# Patient Record
Sex: Female | Born: 1940 | Race: White | Hispanic: No | Smoking: Never smoker
Health system: Southern US, Community
[De-identification: ages and names within clinical notes are randomized; demographics above are authoritative.]

## PROBLEM LIST (undated history)

## (undated) ENCOUNTER — Emergency Department (HOSPITAL_COMMUNITY): Admission: EM | Payer: Medicare Other | Source: Home / Self Care

## (undated) DIAGNOSIS — F329 Major depressive disorder, single episode, unspecified: Secondary | ICD-10-CM

## (undated) DIAGNOSIS — I4891 Unspecified atrial fibrillation: Secondary | ICD-10-CM

## (undated) DIAGNOSIS — I10 Essential (primary) hypertension: Secondary | ICD-10-CM

## (undated) DIAGNOSIS — F39 Unspecified mood [affective] disorder: Secondary | ICD-10-CM

## (undated) DIAGNOSIS — Z915 Personal history of self-harm: Secondary | ICD-10-CM

## (undated) DIAGNOSIS — Z9151 Personal history of suicidal behavior: Secondary | ICD-10-CM

## (undated) DIAGNOSIS — I82409 Acute embolism and thrombosis of unspecified deep veins of unspecified lower extremity: Secondary | ICD-10-CM

## (undated) DIAGNOSIS — R1310 Dysphagia, unspecified: Secondary | ICD-10-CM

## (undated) DIAGNOSIS — K219 Gastro-esophageal reflux disease without esophagitis: Secondary | ICD-10-CM

## (undated) DIAGNOSIS — F32A Depression, unspecified: Secondary | ICD-10-CM

## (undated) DIAGNOSIS — J449 Chronic obstructive pulmonary disease, unspecified: Secondary | ICD-10-CM

## (undated) DIAGNOSIS — K449 Diaphragmatic hernia without obstruction or gangrene: Secondary | ICD-10-CM

## (undated) DIAGNOSIS — M1990 Unspecified osteoarthritis, unspecified site: Secondary | ICD-10-CM

## (undated) DIAGNOSIS — N289 Disorder of kidney and ureter, unspecified: Secondary | ICD-10-CM

## (undated) HISTORY — PX: HYSTERECTOMY, ABDOMINAL, OPEN: SHX000826

## (undated) HISTORY — PX: OTHER SURGICAL HISTORY: SHX169

## (undated) HISTORY — PX: ABDOMINAL HYSTERECTOMY: SHX81

## (undated) HISTORY — PX: HIP SURGERY: SHX245

---

## 1996-08-13 ENCOUNTER — Other Ambulatory Visit: Payer: Self-pay | Admitting: Emergency Medicine

## 2001-08-25 ENCOUNTER — Emergency Department: Admit: 2001-08-25 | Payer: Self-pay

## 2006-01-23 ENCOUNTER — Ambulatory Visit: Payer: Self-pay | Admitting: *Deleted

## 2006-01-23 ENCOUNTER — Encounter: Payer: Self-pay | Admitting: Emergency Medicine

## 2006-01-23 ENCOUNTER — Inpatient Hospital Stay (HOSPITAL_COMMUNITY): Admission: EM | Admit: 2006-01-23 | Discharge: 2006-01-31 | Payer: Self-pay | Admitting: *Deleted

## 2006-03-16 ENCOUNTER — Ambulatory Visit: Payer: Self-pay | Admitting: Internal Medicine

## 2006-03-26 ENCOUNTER — Emergency Department (HOSPITAL_COMMUNITY): Admission: EM | Admit: 2006-03-26 | Discharge: 2006-03-26 | Payer: Self-pay | Admitting: Emergency Medicine

## 2006-04-10 ENCOUNTER — Ambulatory Visit: Payer: Self-pay | Admitting: Internal Medicine

## 2006-04-13 ENCOUNTER — Ambulatory Visit: Payer: Self-pay | Admitting: Internal Medicine

## 2008-09-01 ENCOUNTER — Ambulatory Visit: Payer: Self-pay | Admitting: Internal Medicine

## 2008-09-01 LAB — CONVERTED CEMR LAB
ALT: 14 units/L (ref 0–35)
AST: 20 units/L (ref 0–37)
Albumin: 4.8 g/dL (ref 3.5–5.2)
Alkaline Phosphatase: 81 units/L (ref 39–117)
BUN: 16 mg/dL (ref 6–23)
Basophils Absolute: 0 10*3/uL (ref 0.0–0.1)
Basophils Relative: 0 % (ref 0–1)
CO2: 25 meq/L (ref 19–32)
Calcium: 10 mg/dL (ref 8.4–10.5)
Chloride: 103 meq/L (ref 96–112)
Creatinine, Ser: 0.71 mg/dL (ref 0.40–1.20)
Eosinophils Absolute: 0.1 10*3/uL (ref 0.0–0.7)
Eosinophils Relative: 1 % (ref 0–5)
Glucose, Bld: 92 mg/dL (ref 70–99)
HCT: 47.6 % — ABNORMAL HIGH (ref 36.0–46.0)
Hemoglobin: 15.3 g/dL — ABNORMAL HIGH (ref 12.0–15.0)
Lymphocytes Relative: 40 % (ref 12–46)
Lymphs Abs: 3.2 10*3/uL (ref 0.7–4.0)
MCHC: 32.1 g/dL (ref 30.0–36.0)
MCV: 88 fL (ref 78.0–100.0)
Monocytes Absolute: 0.5 10*3/uL (ref 0.1–1.0)
Monocytes Relative: 6 % (ref 3–12)
Neutro Abs: 4.1 10*3/uL (ref 1.7–7.7)
Neutrophils Relative %: 52 % (ref 43–77)
Platelets: 269 10*3/uL (ref 150–400)
Potassium: 4.8 meq/L (ref 3.5–5.3)
RBC: 5.41 M/uL — ABNORMAL HIGH (ref 3.87–5.11)
RDW: 13.3 % (ref 11.5–15.5)
Sodium: 141 meq/L (ref 135–145)
TSH: 1.978 microintl units/mL (ref 0.350–4.500)
Total Bilirubin: 1.1 mg/dL (ref 0.3–1.2)
Total Protein: 7.6 g/dL (ref 6.0–8.3)
WBC: 7.9 10*3/uL (ref 4.0–10.5)

## 2008-09-23 ENCOUNTER — Emergency Department (HOSPITAL_COMMUNITY): Admission: EM | Admit: 2008-09-23 | Discharge: 2008-09-24 | Payer: Self-pay | Admitting: Emergency Medicine

## 2008-10-02 ENCOUNTER — Ambulatory Visit: Payer: Self-pay | Admitting: Internal Medicine

## 2008-10-13 ENCOUNTER — Emergency Department (HOSPITAL_COMMUNITY): Admission: EM | Admit: 2008-10-13 | Discharge: 2008-10-14 | Payer: Self-pay | Admitting: Emergency Medicine

## 2009-06-28 ENCOUNTER — Encounter (INDEPENDENT_AMBULATORY_CARE_PROVIDER_SITE_OTHER): Payer: Self-pay | Admitting: Adult Health

## 2009-06-28 ENCOUNTER — Ambulatory Visit: Payer: Self-pay | Admitting: Internal Medicine

## 2009-06-30 ENCOUNTER — Ambulatory Visit: Payer: Self-pay | Admitting: Internal Medicine

## 2009-06-30 LAB — CONVERTED CEMR LAB

## 2009-07-14 ENCOUNTER — Ambulatory Visit: Payer: Self-pay | Admitting: Internal Medicine

## 2009-07-22 ENCOUNTER — Ambulatory Visit: Payer: Self-pay | Admitting: Psychiatry

## 2009-07-22 ENCOUNTER — Inpatient Hospital Stay (HOSPITAL_COMMUNITY): Admission: RE | Admit: 2009-07-22 | Discharge: 2009-07-28 | Payer: Self-pay | Admitting: Psychiatry

## 2009-10-05 ENCOUNTER — Ambulatory Visit: Payer: Self-pay | Admitting: Internal Medicine

## 2009-10-06 ENCOUNTER — Emergency Department (HOSPITAL_COMMUNITY): Admission: EM | Admit: 2009-10-06 | Discharge: 2009-10-07 | Payer: Self-pay | Admitting: Emergency Medicine

## 2009-10-26 ENCOUNTER — Ambulatory Visit: Payer: Self-pay | Admitting: Internal Medicine

## 2009-11-29 ENCOUNTER — Ambulatory Visit: Payer: Self-pay | Admitting: Psychiatry

## 2009-11-29 ENCOUNTER — Inpatient Hospital Stay (HOSPITAL_COMMUNITY): Admission: EM | Admit: 2009-11-29 | Discharge: 2009-12-06 | Payer: Self-pay | Admitting: Psychiatry

## 2009-12-30 ENCOUNTER — Ambulatory Visit: Payer: Self-pay | Admitting: Internal Medicine

## 2010-01-06 ENCOUNTER — Other Ambulatory Visit: Payer: Self-pay | Admitting: Emergency Medicine

## 2010-01-06 ENCOUNTER — Inpatient Hospital Stay (HOSPITAL_COMMUNITY)
Admission: AD | Admit: 2010-01-06 | Discharge: 2010-01-17 | Payer: Self-pay | Source: Home / Self Care | Admitting: Psychiatry

## 2010-01-06 ENCOUNTER — Ambulatory Visit: Payer: Self-pay | Admitting: Psychiatry

## 2010-03-03 ENCOUNTER — Emergency Department (HOSPITAL_COMMUNITY)
Admission: EM | Admit: 2010-03-03 | Discharge: 2010-03-03 | Disposition: A | Discharge status: Other Acute Inpatient Hospital | Payer: Self-pay | Source: Home / Self Care | Admitting: Emergency Medicine

## 2010-03-03 ENCOUNTER — Inpatient Hospital Stay (HOSPITAL_COMMUNITY)
Admission: RE | Admit: 2010-03-03 | Discharge: 2010-03-16 | Payer: Self-pay | Source: Home / Self Care | Attending: Psychiatry | Admitting: Psychiatry

## 2010-03-29 ENCOUNTER — Inpatient Hospital Stay (HOSPITAL_COMMUNITY)
Admission: EM | Admit: 2010-03-29 | Discharge: 2010-04-01 | Payer: Self-pay | Source: Home / Self Care | Attending: Psychiatry | Admitting: Psychiatry

## 2010-04-04 LAB — TSH: TSH: 1.166 u[IU]/mL (ref 0.350–4.500)
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
## 2010-05-30 LAB — URINALYSIS, ROUTINE W REFLEX MICROSCOPIC
Bilirubin Urine: NEGATIVE
Glucose, UA: NEGATIVE mg/dL
Hgb urine dipstick: NEGATIVE
Ketones, ur: NEGATIVE mg/dL
Nitrite: NEGATIVE
Protein, ur: NEGATIVE mg/dL
Specific Gravity, Urine: 1.006 (ref 1.005–1.030)
Urobilinogen, UA: 1 mg/dL (ref 0.0–1.0)
pH: 7.5 (ref 5.0–8.0)

## 2010-05-30 LAB — RAPID URINE DRUG SCREEN, HOSP PERFORMED
Amphetamines: NOT DETECTED
Barbiturates: NOT DETECTED
Benzodiazepines: NOT DETECTED
Cocaine: NOT DETECTED
Opiates: NOT DETECTED
Tetrahydrocannabinol: NOT DETECTED

## 2010-05-30 LAB — BASIC METABOLIC PANEL
BUN: 6 mg/dL (ref 6–23)
BUN: 12 mg/dL (ref 6–23)
CO2: 29 mEq/L (ref 19–32)
CO2: 32 mEq/L (ref 19–32)
Calcium: 10.1 mg/dL (ref 8.4–10.5)
Calcium: 9.8 mg/dL (ref 8.4–10.5)
Chloride: 104 mEq/L (ref 96–112)
Chloride: 104 mEq/L (ref 96–112)
Creatinine, Ser: 0.98 mg/dL (ref 0.4–1.2)
Creatinine, Ser: 0.81 mg/dL (ref 0.4–1.2)
GFR calc Af Amer: >60 mL/min (ref >60)
GFR calc Af Amer: >60 mL/min (ref >60)
GFR calc non Af Amer: 56 mL/min — ABNORMAL LOW (ref >60)
GFR calc non Af Amer: >60 mL/min (ref >60)
Glucose, Bld: 103 mg/dL — ABNORMAL HIGH (ref 70–99)
Glucose, Bld: 96 mg/dL (ref 70–99)
Potassium: 4 mEq/L (ref 3.5–5.1)
Potassium: 4.6 mEq/L (ref 3.5–5.1)
Sodium: 142 mEq/L (ref 135–145)
Sodium: 144 mEq/L (ref 135–145)

## 2010-05-30 LAB — DIFFERENTIAL
Basophils Absolute: 0 10*3/uL (ref 0.0–0.1)
Basophils Relative: 0 % (ref 0–1)
Eosinophils Absolute: 0.1 10*3/uL (ref 0.0–0.7)
Eosinophils Relative: 1 % (ref 0–5)
Lymphocytes Relative: 38 % (ref 12–46)
Lymphs Abs: 2.5 10*3/uL (ref 0.7–4.0)
Monocytes Absolute: 0.5 10*3/uL (ref 0.1–1.0)
Monocytes Relative: 8 % (ref 3–12)
Neutro Abs: 3.4 10*3/uL (ref 1.7–7.7)
Neutrophils Relative %: 53 % (ref 43–77)

## 2010-05-30 LAB — CBC
HCT: 42.6 % (ref 36.0–46.0)
Hemoglobin: 13.6 g/dL (ref 12.0–15.0)
MCH: 28.2 pg (ref 26.0–34.0)
MCHC: 31.9 g/dL (ref 30.0–36.0)
MCV: 88.2 fL (ref 78.0–100.0)
Platelets: 249 10*3/uL (ref 150–400)
RBC: 4.83 MIL/uL (ref 3.87–5.11)
RDW: 13.1 % (ref 11.5–15.5)
WBC: 6.5 10*3/uL (ref 4.0–10.5)

## 2010-05-30 LAB — TRICYCLICS SCREEN, URINE: TCA Scrn: NOT DETECTED

## 2010-05-30 LAB — ETHANOL: Alcohol, Ethyl (B): <5 mg/dL (ref 0–10)

## 2010-06-01 LAB — URINALYSIS, ROUTINE W REFLEX MICROSCOPIC
Bilirubin Urine: NEGATIVE
Glucose, UA: NEGATIVE mg/dL
Hgb urine dipstick: NEGATIVE
Ketones, ur: NEGATIVE mg/dL
Nitrite: NEGATIVE
Protein, ur: NEGATIVE mg/dL
Specific Gravity, Urine: 1.011 (ref 1.005–1.030)
Urobilinogen, UA: 1 mg/dL (ref 0.0–1.0)
pH: 6 (ref 5.0–8.0)

## 2010-06-01 LAB — CBC
HCT: 46.9 % — ABNORMAL HIGH (ref 36.0–46.0)
Hemoglobin: 15.2 g/dL — ABNORMAL HIGH (ref 12.0–15.0)
MCH: 28.8 pg (ref 26.0–34.0)
MCHC: 32.4 g/dL (ref 30.0–36.0)
MCV: 89 fL (ref 78.0–100.0)
Platelets: 212 10*3/uL (ref 150–400)
RBC: 5.27 MIL/uL — ABNORMAL HIGH (ref 3.87–5.11)
RDW: 12.6 % (ref 11.5–15.5)
WBC: 9.4 10*3/uL (ref 4.0–10.5)

## 2010-06-01 LAB — URINE MICROSCOPIC-ADD ON

## 2010-06-01 LAB — BASIC METABOLIC PANEL
BUN: 10 mg/dL (ref 6–23)
CO2: 32 mEq/L (ref 19–32)
Calcium: 9.9 mg/dL (ref 8.4–10.5)
Chloride: 108 mEq/L (ref 96–112)
Creatinine, Ser: 0.7 mg/dL (ref 0.4–1.2)
GFR calc Af Amer: >60 mL/min (ref >60)
GFR calc non Af Amer: >60 mL/min (ref >60)
Glucose, Bld: 76 mg/dL (ref 70–99)
Potassium: 3.8 mEq/L (ref 3.5–5.1)
Sodium: 144 mEq/L (ref 135–145)

## 2010-06-01 LAB — ETHANOL: Alcohol, Ethyl (B): <5 mg/dL (ref 0–10)

## 2010-06-01 LAB — RAPID URINE DRUG SCREEN, HOSP PERFORMED
Amphetamines: NOT DETECTED
Barbiturates: NOT DETECTED
Benzodiazepines: NOT DETECTED
Cocaine: NOT DETECTED
Opiates: NOT DETECTED
Tetrahydrocannabinol: NOT DETECTED

## 2010-06-01 LAB — DIFFERENTIAL
Basophils Absolute: 0 10*3/uL (ref 0.0–0.1)
Basophils Relative: 0 % (ref 0–1)
Eosinophils Absolute: 0.1 10*3/uL (ref 0.0–0.7)
Eosinophils Relative: 1 % (ref 0–5)
Lymphocytes Relative: 42 % (ref 12–46)
Lymphs Abs: 4 10*3/uL (ref 0.7–4.0)
Monocytes Absolute: 0.8 10*3/uL (ref 0.1–1.0)
Monocytes Relative: 8 % (ref 3–12)
Neutro Abs: 4.5 10*3/uL (ref 1.7–7.7)
Neutrophils Relative %: 48 % (ref 43–77)

## 2010-06-01 LAB — ACETAMINOPHEN LEVEL: Acetaminophen (Tylenol), Serum: <10 ug/mL — ABNORMAL LOW (ref 10–30)

## 2010-06-02 LAB — CBC
HCT: 44.5 % (ref 36.0–46.0)
Hemoglobin: 14.4 g/dL (ref 12.0–15.0)
MCH: 28.7 pg (ref 26.0–34.0)
MCHC: 32.4 g/dL (ref 30.0–36.0)
MCV: 88.6 fL (ref 78.0–100.0)
Platelets: 241 10*3/uL (ref 150–400)
RBC: 5.02 MIL/uL (ref 3.87–5.11)
RDW: 12.8 % (ref 11.5–15.5)
WBC: 7.2 10*3/uL (ref 4.0–10.5)

## 2010-06-02 LAB — DIFFERENTIAL
Basophils Absolute: 0 10*3/uL (ref 0.0–0.1)
Basophils Relative: 0 % (ref 0–1)
Eosinophils Absolute: 0.1 10*3/uL (ref 0.0–0.7)
Eosinophils Relative: 1 % (ref 0–5)
Lymphocytes Relative: 34 % (ref 12–46)
Lymphs Abs: 2.5 10*3/uL (ref 0.7–4.0)
Monocytes Absolute: 0.5 10*3/uL (ref 0.1–1.0)
Monocytes Relative: 7 % (ref 3–12)
Neutro Abs: 4.1 10*3/uL (ref 1.7–7.7)
Neutrophils Relative %: 57 % (ref 43–77)

## 2010-06-02 LAB — COMPREHENSIVE METABOLIC PANEL
ALT: 12 U/L (ref 0–35)
AST: 22 U/L (ref 0–37)
Albumin: 3.7 g/dL (ref 3.5–5.2)
Alkaline Phosphatase: 78 U/L (ref 39–117)
BUN: 10 mg/dL (ref 6–23)
CO2: 28 mEq/L (ref 19–32)
Calcium: 9.6 mg/dL (ref 8.4–10.5)
Chloride: 106 mEq/L (ref 96–112)
Creatinine, Ser: 0.8 mg/dL (ref 0.4–1.2)
GFR calc Af Amer: >60 mL/min (ref >60)
GFR calc non Af Amer: >60 mL/min (ref >60)
Glucose, Bld: 104 mg/dL — ABNORMAL HIGH (ref 70–99)
Potassium: 4 mEq/L (ref 3.5–5.1)
Sodium: 139 mEq/L (ref 135–145)
Total Bilirubin: 1.3 mg/dL — ABNORMAL HIGH (ref 0.3–1.2)
Total Protein: 6.5 g/dL (ref 6.0–8.3)

## 2010-06-02 LAB — ETHANOL: Alcohol, Ethyl (B): <5 mg/dL (ref 0–10)

## 2010-06-02 LAB — URINALYSIS, ROUTINE W REFLEX MICROSCOPIC
Bilirubin Urine: NEGATIVE
Glucose, UA: NEGATIVE mg/dL
Hgb urine dipstick: NEGATIVE
Ketones, ur: NEGATIVE mg/dL
pH: 8 (ref 5.0–8.0)

## 2010-06-02 LAB — URINE MICROSCOPIC-ADD ON

## 2010-06-02 LAB — RAPID URINE DRUG SCREEN, HOSP PERFORMED
Barbiturates: NOT DETECTED
Opiates: NOT DETECTED

## 2010-06-02 LAB — ACETAMINOPHEN LEVEL: Acetaminophen (Tylenol), Serum: <10 ug/mL — ABNORMAL LOW (ref 10–30)

## 2010-06-02 LAB — POCT CARDIAC MARKERS: Troponin i, poc: <0.05 ng/mL (ref 0.00–0.09)

## 2010-06-02 LAB — SALICYLATE LEVEL: Salicylate Lvl: <4 mg/dL (ref 2.8–20.0)

## 2010-06-02 LAB — BRAIN NATRIURETIC PEPTIDE: Pro B Natriuretic peptide (BNP): 35 pg/mL (ref 0.0–100.0)

## 2010-06-02 LAB — LIPASE, BLOOD: Lipase: 34 U/L (ref 11–59)

## 2010-06-04 LAB — COMPREHENSIVE METABOLIC PANEL
AST: 22 U/L (ref 0–37)
Alkaline Phosphatase: 77 U/L (ref 39–117)
BUN: 8 mg/dL (ref 6–23)
CO2: 29 mEq/L (ref 19–32)
Chloride: 104 mEq/L (ref 96–112)
Creatinine, Ser: 0.75 mg/dL (ref 0.4–1.2)
GFR calc Af Amer: >60 mL/min (ref >60)
GFR calc non Af Amer: >60 mL/min (ref >60)
Potassium: 3.8 mEq/L (ref 3.5–5.1)
Total Bilirubin: 1.3 mg/dL — ABNORMAL HIGH (ref 0.3–1.2)

## 2010-06-04 LAB — CBC
Hemoglobin: 14.3 g/dL (ref 12.0–15.0)
MCH: 28.9 pg (ref 26.0–34.0)
MCV: 87.1 fL (ref 78.0–100.0)
RBC: 4.94 MIL/uL (ref 3.87–5.11)
WBC: 12.4 10*3/uL — ABNORMAL HIGH (ref 4.0–10.5)

## 2010-06-04 LAB — DIFFERENTIAL
Eosinophils Absolute: 0.1 10*3/uL (ref 0.0–0.7)
Eosinophils Relative: 0 % (ref 0–5)
Lymphs Abs: 2.2 10*3/uL (ref 0.7–4.0)

## 2010-06-04 LAB — URINE CULTURE: Colony Count: >=100000

## 2010-06-04 LAB — URINALYSIS, ROUTINE W REFLEX MICROSCOPIC
Glucose, UA: NEGATIVE mg/dL
Ketones, ur: NEGATIVE mg/dL
Protein, ur: NEGATIVE mg/dL

## 2010-06-04 LAB — RAPID URINE DRUG SCREEN, HOSP PERFORMED: Cocaine: NOT DETECTED

## 2010-06-07 LAB — TSH: TSH: 1.564 u[IU]/mL (ref 0.350–4.500)

## 2010-06-07 LAB — DIFFERENTIAL
Basophils Relative: 0 % (ref 0–1)
Eosinophils Absolute: 0.1 10*3/uL (ref 0.0–0.7)
Eosinophils Relative: 1 % (ref 0–5)
Monocytes Relative: 7 % (ref 3–12)
Neutrophils Relative %: 53 % (ref 43–77)

## 2010-06-07 LAB — URINALYSIS, ROUTINE W REFLEX MICROSCOPIC
Glucose, UA: NEGATIVE mg/dL
Specific Gravity, Urine: 1.012 (ref 1.005–1.030)
pH: 7.5 (ref 5.0–8.0)

## 2010-06-07 LAB — BASIC METABOLIC PANEL
BUN: 13 mg/dL (ref 6–23)
CO2: 30 mEq/L (ref 19–32)
Chloride: 106 mEq/L (ref 96–112)
Creatinine, Ser: 0.76 mg/dL (ref 0.4–1.2)
Potassium: 3.7 mEq/L (ref 3.5–5.1)

## 2010-06-07 LAB — RAPID URINE DRUG SCREEN, HOSP PERFORMED
Barbiturates: NOT DETECTED
Benzodiazepines: NOT DETECTED
Cocaine: NOT DETECTED
Opiates: NOT DETECTED

## 2010-06-07 LAB — POCT CARDIAC MARKERS: Myoglobin, poc: 50.1 ng/mL (ref 12–200)

## 2010-06-07 LAB — CBC
HCT: 39.8 % (ref 36.0–46.0)
MCHC: 34.2 g/dL (ref 30.0–36.0)
MCV: 87.9 fL (ref 78.0–100.0)
Platelets: 226 10*3/uL (ref 150–400)

## 2010-06-07 LAB — GLUCOSE, CAPILLARY: Glucose-Capillary: 111 mg/dL — ABNORMAL HIGH (ref 70–99)

## 2010-06-14 ENCOUNTER — Encounter: Payer: Self-pay | Admitting: Internal Medicine

## 2010-06-14 LAB — CONVERTED CEMR LAB
ALT: 11 units/L (ref 0–35)
BUN: 16 mg/dL (ref 6–23)
Basophils Absolute: 0 10*3/uL (ref 0.0–0.1)
CO2: 25 meq/L (ref 19–32)
Calcium: 10.2 mg/dL (ref 8.4–10.5)
Chloride: 106 meq/L (ref 96–112)
Creatinine, Ser: 0.78 mg/dL (ref 0.40–1.20)
Eosinophils Relative: 1 % (ref 0–5)
Glucose, Bld: 115 mg/dL — ABNORMAL HIGH (ref 70–99)
HCT: 46.8 % — ABNORMAL HIGH (ref 36.0–46.0)
Hemoglobin: 14.8 g/dL (ref 12.0–15.0)
Lymphocytes Relative: 35 % (ref 12–46)
Lymphs Abs: 2.2 10*3/uL (ref 0.7–4.0)
Monocytes Absolute: 0.5 10*3/uL (ref 0.1–1.0)
Monocytes Relative: 8 % (ref 3–12)
Neutro Abs: 3.5 10*3/uL (ref 1.7–7.7)
Pro B Natriuretic peptide (BNP): 30.4 pg/mL (ref 0.0–100.0)
RBC: 5.34 M/uL — ABNORMAL HIGH (ref 3.87–5.11)
RDW: 13.2 % (ref 11.5–15.5)
Total Bilirubin: 1.9 mg/dL — ABNORMAL HIGH (ref 0.3–1.2)
Vitamin B-12: >2000 pg/mL — ABNORMAL HIGH (ref 211–911)

## 2010-06-26 LAB — URINALYSIS, ROUTINE W REFLEX MICROSCOPIC
Hgb urine dipstick: NEGATIVE
Nitrite: NEGATIVE
Protein, ur: NEGATIVE mg/dL
Specific Gravity, Urine: 1.017 (ref 1.005–1.030)
Urobilinogen, UA: 1 mg/dL (ref 0.0–1.0)

## 2010-06-26 LAB — BASIC METABOLIC PANEL
CO2: 29 mEq/L (ref 19–32)
Calcium: 9.6 mg/dL (ref 8.4–10.5)
GFR calc Af Amer: >60 mL/min (ref >60)
GFR calc non Af Amer: >60 mL/min (ref >60)
Potassium: 4.1 mEq/L (ref 3.5–5.1)
Sodium: 140 mEq/L (ref 135–145)

## 2010-06-26 LAB — CBC
HCT: 43.8 % (ref 36.0–46.0)
Hemoglobin: 14.7 g/dL (ref 12.0–15.0)
MCHC: 33.5 g/dL (ref 30.0–36.0)
RBC: 4.95 MIL/uL (ref 3.87–5.11)

## 2010-06-26 LAB — DIFFERENTIAL
Eosinophils Relative: 1 % (ref 0–5)
Lymphocytes Relative: 36 % (ref 12–46)
Monocytes Absolute: 0.4 10*3/uL (ref 0.1–1.0)
Monocytes Relative: 7 % (ref 3–12)
Neutro Abs: 3.6 10*3/uL (ref 1.7–7.7)

## 2010-06-26 LAB — RAPID URINE DRUG SCREEN, HOSP PERFORMED
Amphetamines: NOT DETECTED
Barbiturates: NOT DETECTED
Opiates: NOT DETECTED
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
## 2010-08-29 ENCOUNTER — Emergency Department (HOSPITAL_COMMUNITY)
Admission: EM | Admit: 2010-08-29 | Discharge: 2010-08-29 | Disposition: A | Discharge status: Home / Self Care | Payer: Medicare Other | Attending: Emergency Medicine | Admitting: Emergency Medicine

## 2010-08-29 DIAGNOSIS — N390 Urinary tract infection, site not specified: Secondary | ICD-10-CM | POA: Insufficient documentation

## 2010-08-29 DIAGNOSIS — F341 Dysthymic disorder: Secondary | ICD-10-CM | POA: Insufficient documentation

## 2010-08-29 DIAGNOSIS — R339 Retention of urine, unspecified: Secondary | ICD-10-CM | POA: Insufficient documentation

## 2010-08-29 LAB — URINALYSIS, ROUTINE W REFLEX MICROSCOPIC
Bilirubin Urine: NEGATIVE
Glucose, UA: NEGATIVE mg/dL
Hgb urine dipstick: NEGATIVE
Specific Gravity, Urine: 1.02 (ref 1.005–1.030)
pH: 5 (ref 5.0–8.0)

## 2010-08-29 LAB — POCT I-STAT, CHEM 8
Calcium, Ion: 1.15 mmol/L (ref 1.12–1.32)
Chloride: 105 mEq/L (ref 96–112)
HCT: 43 % (ref 36.0–46.0)
Potassium: 4.2 mEq/L (ref 3.5–5.1)
Sodium: 140 mEq/L (ref 135–145)

## 2010-08-29 LAB — URINE MICROSCOPIC-ADD ON

## 2011-03-28 ENCOUNTER — Emergency Department (HOSPITAL_COMMUNITY)
Admission: EM | Admit: 2011-03-28 | Discharge: 2011-03-28 | Disposition: A | Discharge status: Home / Self Care | Payer: Medicare HMO | Attending: Internal Medicine | Admitting: Internal Medicine

## 2011-03-28 ENCOUNTER — Emergency Department (HOSPITAL_COMMUNITY): Payer: Medicare HMO

## 2011-03-28 DIAGNOSIS — R079 Chest pain, unspecified: Secondary | ICD-10-CM | POA: Insufficient documentation

## 2011-03-28 DIAGNOSIS — R109 Unspecified abdominal pain: Secondary | ICD-10-CM | POA: Insufficient documentation

## 2011-03-28 DIAGNOSIS — K5900 Constipation, unspecified: Secondary | ICD-10-CM | POA: Insufficient documentation

## 2011-03-28 DIAGNOSIS — R339 Retention of urine, unspecified: Secondary | ICD-10-CM | POA: Insufficient documentation

## 2011-03-28 LAB — COMPREHENSIVE METABOLIC PANEL
ALT: 10 U/L (ref 0–35)
AST: 16 U/L (ref 0–37)
CO2: 28 mEq/L (ref 19–32)
Chloride: 104 mEq/L (ref 96–112)
Creatinine, Ser: 0.77 mg/dL (ref 0.50–1.10)
GFR calc non Af Amer: 83 mL/min — ABNORMAL LOW (ref >90)
Total Bilirubin: 0.8 mg/dL (ref 0.3–1.2)

## 2011-03-28 LAB — CBC
HCT: 43.8 % (ref 36.0–46.0)
Hemoglobin: 14.2 g/dL (ref 12.0–15.0)
RBC: 5.22 MIL/uL — ABNORMAL HIGH (ref 3.87–5.11)
RDW: 13.9 % (ref 11.5–15.5)
WBC: 8.1 10*3/uL (ref 4.0–10.5)

## 2011-03-28 LAB — URINALYSIS, ROUTINE W REFLEX MICROSCOPIC
Bilirubin Urine: NEGATIVE
Hgb urine dipstick: NEGATIVE
Protein, ur: NEGATIVE mg/dL
Urobilinogen, UA: 0.2 mg/dL (ref 0.0–1.0)

## 2011-03-28 LAB — DIFFERENTIAL
Basophils Absolute: 0 10*3/uL (ref 0.0–0.1)
Lymphocytes Relative: 36 % (ref 12–46)
Lymphs Abs: 2.9 10*3/uL (ref 0.7–4.0)
Monocytes Absolute: 0.5 10*3/uL (ref 0.1–1.0)
Neutro Abs: 4.6 10*3/uL (ref 1.7–7.7)

## 2011-03-28 MED ORDER — DOCUSATE SODIUM 50 MG/5ML PO LIQD
100.0000 mg | Freq: Once | ORAL | Status: AC
  Administered 2011-03-28: 100 mg via ORAL
  Filled 2011-03-28: qty 10

## 2011-03-28 MED ORDER — MAGNESIUM CITRATE PO SOLN
1.0000 | Freq: Once | ORAL | Status: DC
  Filled 2011-03-28: qty 296

## 2011-03-28 MED ORDER — DOCUSATE SODIUM 100 MG PO CAPS
100.0000 mg | ORAL_CAPSULE | Freq: Once | ORAL | Status: DC
  Filled 2011-03-28 (×3): qty 1

## 2011-03-28 MED ORDER — BISACODYL 10 MG RE SUPP
10.0000 mg | Freq: Once | RECTAL | Status: AC
  Administered 2011-03-28: 10 mg via RECTAL
  Filled 2011-03-28: qty 1

## 2011-03-28 NOTE — ED Notes (Signed)
Per patient she cannot swallow pills and refusing colace unless liquid form. rn contacting pharmacy

## 2011-03-28 NOTE — ED Notes (Signed)
Spoke with Child psychotherapist and taxi voucher located ED front desk

## 2011-03-28 NOTE — ED Notes (Signed)
Blood drawn by RN and given to phlebotomy who sent blood at 1515

## 2011-03-28 NOTE — ED Notes (Signed)
Pt states she has not had bowel movement or had any urine in 10 days.  States abdomin hurts and is swollen.  Denies the ability to pass gas and denies vomiting.

## 2011-03-28 NOTE — ED Notes (Signed)
Pt. Reports not urinating in 10 days and also not having a BM in 10 days.  Pt. Also requests if she has to go to a "nerve hospital" she wants to go to [facility].  She reports having "nerve problems"  Her father died in 05/04/2022,

## 2011-03-28 NOTE — ED Notes (Signed)
Familiar with pt from previous visits.  Pt reports living alone and being independent with ADLs.  Pt has her own apt. Behind [address].  Pt ststes that she can pay her rent, utilities, and buy groceries with her check, with several hundred dollars still left over.  [name] cell phone needs a new battery and she has money to buy a new phone/battery.  In case of an emergency, pt says she has neighbors who would let her use their phone.  Pt typically uses the bus or taxi for transportation.  Pt denies any social work needs.  She requests a cab voucher to get home because the bus that takes her home quits running at 5:30.  CSW will leave voucher for her at Triage.

## 2011-03-28 NOTE — ED Provider Notes (Signed)
History     CSN: [id number]  Arrival date & time 03/28/11  1123   First MD Initiated Contact with Patient 03/28/11 1228      Chief Complaint  Patient presents with  . Abdominal Pain    (Consider location/radiation/quality/duration/timing/severity/associated sxs/prior treatment) HPI Comments: Patient reports no bowel movements or urinary function for 10 days. Patient seen in clinic today and sent to ED for further evaluation.  Patient is a 71 y.o. female presenting with general illness. The history is provided by the patient. No language interpreter was used.  Illness  The current episode started more than 1 week ago. The onset was gradual. The problem occurs continuously. The problem has been unchanged. The problem is moderate. The symptoms are relieved by nothing. The symptoms are aggravated by nothing. Associated symptoms include abdominal pain (Mild, lower). Pertinent negatives include no fever, no diarrhea, no nausea, no vomiting and no cough.    History reviewed. No pertinent past medical history.  History reviewed. No pertinent past surgical history.  History reviewed. No pertinent family history.  History  Substance Use Topics  . Smoking status: Never Smoker   . Smokeless tobacco: Not on file  . Alcohol Use: No    OB History    Grav Para Term Preterm Abortions TAB SAB Ect Mult Living                  Review of Systems  Unable to perform ROS Constitutional: Negative for fever and chills.  Respiratory: Negative for cough and shortness of breath.   Cardiovascular: Positive for chest pain (chronic, seoncdary to hiatal hernia). Negative for leg swelling.  Gastrointestinal: Positive for abdominal pain (Mild, lower). Negative for nausea, vomiting and diarrhea.  All other systems reviewed and are negative.    Allergies  Dalmane; Penicillins; and Sulfa antibiotics  Home Medications  No current outpatient prescriptions on file.  BP 150/77  Pulse 92  Temp(Src)  98.2 F (36.8 C) (Oral)  Resp 16  Ht 5\' 2"  (1.575 m)  Wt 90 lb (40.824 kg)  BMI 16.46 kg/m2  SpO2 98%  Physical Exam  Nursing note and vitals reviewed. Constitutional: She is oriented to person, place, and time. She appears well-developed and well-nourished. No distress.  HENT:  Head: Normocephalic and atraumatic.  Eyes: EOM are normal. Pupils are equal, round, and reactive to light.  Neck: Normal range of motion. Neck supple.  Cardiovascular: Normal rate and regular rhythm.  Exam reveals no friction rub.   No murmur heard. Pulmonary/Chest: Effort normal and breath sounds normal. No respiratory distress. She has no wheezes. She has no rales.  Abdominal: Soft. She exhibits no distension. There is no tenderness. There is no rebound.  Genitourinary: Rectum normal. Rectal exam shows no mass and no tenderness.       No stool impaction  Musculoskeletal: Normal range of motion. She exhibits no edema.  Neurological: She is alert and oriented to person, place, and time.  Skin: She is not diaphoretic.    ED Course  Procedures (including critical care time)   Labs Reviewed  CBC  DIFFERENTIAL  COMPREHENSIVE METABOLIC PANEL  URINALYSIS, ROUTINE W REFLEX MICROSCOPIC   Dg Abd 1 View  03/28/2011  *RADIOLOGY REPORT*  Clinical Data: Constipation  ABDOMEN - 1 VIEW  Comparison: 10/14/2008  Findings: Nonobstructive bowel gas pattern.  Moderate stool throughout the colon.  Degenerative changes with thoracolumbar scoliosis.  Prior fixation of a right femur fracture.  IMPRESSION: No evidence of bowel obstruction.  Moderate stool  throughout the colon.  Original Report Authenticated By: Charline Bills, M.D.     1. Constipation       MDM    Patient is a 71 year old female who presents with abdominal pain, urinary retention, constipation. Patient reports no bowel movements or any urinary function for the past 10 days. Seen in clinic earlier today and sent to the hospital for further evaluation.  Patient reports history of nerve problems and no other concerning medical history. Patient's vitals were stable and exam is benign. She has mild suprapubic tenderness. We will obtain a cathed urine sample. Also check labs to look at her renal function. Rectal exam shows no evidence of impaction. Abdomen films shows moderate stool burden. Will give colace and PO challenge. Urine normal. Remainder of labs normal. No increased creatinine. Highly doubt no urinary function for 10 days with urine in bladder and normal creatinine. Patient discharged home in stable condition and instructed to use Milk of Magnesia for constipation. Instructed to f/u with PCP in 2-3 days. Given numbers for resources to help establish a primary care provider as needed. Social work to see patient to help with resources. Given dulcolax suppository and given bottle of magnesium citrate to go home with to help with constipation.   Elwin Mocha, MD 03/28/11 [phone]

## 2011-03-28 NOTE — ED Notes (Signed)
Foley removed.

## 2011-03-28 NOTE — ED Provider Notes (Signed)
  I performed a history and physical examination of Shannon Hurst and discussed her management with Dr. Gwendolyn Grant.  I agree with the history, physical, assessment, and plan of care, with the following exceptions: None  Constipation or with no recent abdominal surgery. No blood in the stool. Also states she's had decreased urinary output.  Slightly distended abdomen otherwise benign examination  I was present for the following procedures: None Time Spent in Critical Care of the patient: None  Tildon Husky, MD 03/28/11 [phone]

## 2011-05-29 ENCOUNTER — Emergency Department (HOSPITAL_COMMUNITY)
Admission: EM | Admit: 2011-05-29 | Discharge: 2011-05-30 | Disposition: A | Discharge status: Other Acute Inpatient Hospital | Payer: Medicare HMO | Attending: Emergency Medicine | Admitting: Emergency Medicine

## 2011-05-29 ENCOUNTER — Encounter (HOSPITAL_COMMUNITY): Payer: Self-pay | Admitting: *Deleted

## 2011-05-29 DIAGNOSIS — K449 Diaphragmatic hernia without obstruction or gangrene: Secondary | ICD-10-CM | POA: Insufficient documentation

## 2011-05-29 DIAGNOSIS — R609 Edema, unspecified: Secondary | ICD-10-CM | POA: Insufficient documentation

## 2011-05-29 DIAGNOSIS — R45851 Suicidal ideations: Secondary | ICD-10-CM

## 2011-05-29 DIAGNOSIS — R0602 Shortness of breath: Secondary | ICD-10-CM | POA: Insufficient documentation

## 2011-05-29 DIAGNOSIS — R4182 Altered mental status, unspecified: Secondary | ICD-10-CM | POA: Insufficient documentation

## 2011-05-29 DIAGNOSIS — F411 Generalized anxiety disorder: Secondary | ICD-10-CM | POA: Insufficient documentation

## 2011-05-29 HISTORY — DX: Unspecified mood (affective) disorder: F39

## 2011-05-29 HISTORY — DX: Depression, unspecified: F32.A

## 2011-05-29 HISTORY — DX: Major depressive disorder, single episode, unspecified: F32.9

## 2011-05-29 HISTORY — DX: Gastro-esophageal reflux disease without esophagitis: K21.9

## 2011-05-29 LAB — CBC
Hemoglobin: 14 g/dL (ref 12.0–15.0)
MCH: 25.9 pg — ABNORMAL LOW (ref 26.0–34.0)
MCV: 80.4 fL (ref 78.0–100.0)
RBC: 5.4 MIL/uL — ABNORMAL HIGH (ref 3.87–5.11)
WBC: 7.7 10*3/uL (ref 4.0–10.5)

## 2011-05-29 LAB — DIFFERENTIAL
Eosinophils Absolute: 0.1 10*3/uL (ref 0.0–0.7)
Lymphocytes Relative: 37 % (ref 12–46)
Lymphs Abs: 2.9 10*3/uL (ref 0.7–4.0)
Monocytes Relative: 8 % (ref 3–12)
Neutrophils Relative %: 53 % (ref 43–77)

## 2011-05-29 LAB — URINALYSIS, ROUTINE W REFLEX MICROSCOPIC
Bilirubin Urine: NEGATIVE
Glucose, UA: NEGATIVE mg/dL
Hgb urine dipstick: NEGATIVE
Specific Gravity, Urine: 1.01 (ref 1.005–1.030)
Urobilinogen, UA: 0.2 mg/dL (ref 0.0–1.0)
pH: 6 (ref 5.0–8.0)

## 2011-05-29 LAB — RAPID URINE DRUG SCREEN, HOSP PERFORMED: Barbiturates: NOT DETECTED

## 2011-05-29 LAB — BASIC METABOLIC PANEL
BUN: 10 mg/dL (ref 6–23)
CO2: 26 mEq/L (ref 19–32)
GFR calc non Af Amer: 84 mL/min — ABNORMAL LOW (ref >90)
Glucose, Bld: 93 mg/dL (ref 70–99)
Potassium: 4.5 mEq/L (ref 3.5–5.1)

## 2011-05-29 LAB — ETHANOL: Alcohol, Ethyl (B): <11 mg/dL (ref 0–11)

## 2011-05-29 MED ORDER — ONDANSETRON HCL 4 MG PO TABS: 4.0000 mg | ORAL_TABLET | Freq: Three times a day (TID) | ORAL | Status: DC | PRN

## 2011-05-29 MED ORDER — NICOTINE 21 MG/24HR TD PT24: 21.0000 mg | MEDICATED_PATCH | Freq: Every day | TRANSDERMAL | Status: DC | PRN

## 2011-05-29 MED ORDER — ACETAMINOPHEN 325 MG PO TABS: 650.0000 mg | ORAL_TABLET | ORAL | Status: DC | PRN

## 2011-05-29 MED ORDER — IBUPROFEN 200 MG PO TABS: 600.0000 mg | ORAL_TABLET | Freq: Three times a day (TID) | ORAL | Status: DC | PRN

## 2011-05-29 MED ORDER — ZOLPIDEM TARTRATE 5 MG PO TABS: 5.0000 mg | ORAL_TABLET | Freq: Every evening | ORAL | Status: DC | PRN

## 2011-05-29 NOTE — ED Provider Notes (Signed)
History     CSN: [id number]  Arrival date & time 05/29/11  1515   First MD Initiated Contact with Patient 05/29/11 1606      Chief Complaint  Patient presents with  . Suicidal    pt wrote suicidal note and gave to registration personnel. pt then proceeded to call taxi cab and attempt to leave. pt states "I've been sick for 4 days and haven't been able to make water for 10 days so I wrote that letter."     (Consider location/radiation/quality/duration/timing/severity/associated sxs/prior treatment) HPI Comments: Shannon Hurst is a 71 y.o. Female who states that she is suicidal. She will not specify why. She lives alone. She states she was a leave her house today and talk to a therapist there who recommended she come here. She is take any medicine. She has been able to eat and drink. She states she has not urinated in 10 days. She denies constipation. She was last hospitalized for psychiatric reasons about a year ago. She states she sees her therapist regularly.  The history is provided by the patient.    Past Medical History  Diagnosis Date  . GERD (gastroesophageal reflux disease)   . Mood disorder     History reviewed. No pertinent past surgical history.  History reviewed. No pertinent family history.  History  Substance Use Topics  . Smoking status: Never Smoker   . Smokeless tobacco: Not on file  . Alcohol Use: No    OB History    Grav Para Term Preterm Abortions TAB SAB Ect Mult Living                  Review of Systems  All other systems reviewed and are negative.    Allergies  Dalmane; Penicillins; and Sulfa antibiotics  Home Medications   Current Outpatient Rx  Name Route Sig Dispense Refill  . CYANOCOBALAMIN 1000 MCG/ML IJ SOLN Intramuscular Inject 1,000 mcg into the muscle every 30 (thirty) days.      BP 120/74  Pulse 76  Temp(Src) 97.7 F (36.5 C) (Oral)  Resp 18  SpO2 93%  Physical Exam  Nursing note and vitals  reviewed. Constitutional: She is oriented to person, place, and time. She appears well-developed and well-nourished.  HENT:  Head: Normocephalic and atraumatic.  Eyes: Conjunctivae and EOM are normal. Pupils are equal, round, and reactive to light.  Neck: Normal range of motion and phonation normal. Neck supple.  Cardiovascular: Normal rate, regular rhythm and intact distal pulses.   Pulmonary/Chest: Effort normal and breath sounds normal. She exhibits no tenderness.  Abdominal: Soft. She exhibits no distension. There is no tenderness. There is no guarding.  Musculoskeletal: Normal range of motion. She exhibits edema (Bilateral peripheral edema). She exhibits no tenderness.  Neurological: She is alert and oriented to person, place, and time. She has normal strength. She exhibits normal muscle tone.  Skin: Skin is warm and dry.  Psychiatric: Her behavior is normal.       She is anxious    ED Course  Procedures (including critical care time)  Initial evaluation: Psychiatric screening. ACT assessment. She may benefit from a Telepsych eval.     Labs Reviewed  CBC - Abnormal; Notable for the following:    RBC 5.40 (*)    MCH 25.9 (*)    All other components within normal limits  BASIC METABOLIC PANEL - Abnormal; Notable for the following:    GFR calc non Af Amer 84 (*)    All  other components within normal limits  DIFFERENTIAL  URINALYSIS, ROUTINE W REFLEX MICROSCOPIC  URINE RAPID DRUG SCREEN (HOSP PERFORMED)  ETHANOL  URINE CULTURE   Patient is medically cleared for psych assessment and treatment  1. Suicidal ideation       MDM  Suicide ideation without a plan. Patient lives alone. Psychiatric assessment to help with  Disposition.        Flint Melter, MD 05/30/11 [phone]

## 2011-05-30 ENCOUNTER — Other Ambulatory Visit: Payer: Self-pay

## 2011-05-30 ENCOUNTER — Emergency Department (HOSPITAL_COMMUNITY): Payer: Medicare HMO

## 2011-05-30 ENCOUNTER — Encounter (HOSPITAL_COMMUNITY): Payer: Self-pay | Admitting: Emergency Medicine

## 2011-05-30 DIAGNOSIS — F39 Unspecified mood [affective] disorder: Secondary | ICD-10-CM | POA: Insufficient documentation

## 2011-05-30 DIAGNOSIS — K219 Gastro-esophageal reflux disease without esophagitis: Secondary | ICD-10-CM | POA: Insufficient documentation

## 2011-05-30 LAB — URINE CULTURE
Colony Count: NO GROWTH
Culture  Setup Time: 201303120230
Culture: NO GROWTH
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
## 2011-05-30 NOTE — ED Provider Notes (Addendum)
10:32 AM I was approached by the behavioral health team about the patient's report of not urinating in 10 days and requested to evaluate this. The patient is in no apparent distress has no abdominal pain or tenderness or distention, and no flank pain. She gave [place] urine last night after a Foley catheter was placed with output of approximately 500 mL of urine, and urine shows normal specific gravity and no sign of infection. Her kidney function is normal, and over the 12 hours that the Foley catheter was in, the patient made approximately 750 mL of urine, and fitting with the normal urine output rate of approximately 1 mL per kilogram per hour for a woman who appears to be approximately 70 kg. This is a normal urine output. I have at this time advised to discontinue the Foley catheter to assure that the  patient can void on her own. I cannot find any evidence to validate the patient's claim of not urinating for 10 days based on the examination and lab testing as well as the course of events in the emergency department.  Felisa Bonier, MD 05/30/11 1034  4:10 PM  Dg Chest Portable 1 View  05/30/2011  *RADIOLOGY REPORT*  Clinical Data: Shortness of breath.  Swallowing issues.  Suicidal.  PORTABLE CHEST - 1 VIEW  Comparison: 11/28/2009  Findings: The heart is mildly enlarged.  There is large hiatal hernia.  There are perihilar bronchitic changes.  No focal consolidations or pleural effusions are identified.  No edema.  Old right rib fractures are identified.  IMPRESSION: 1.  Bronchitic changes.  2.  Large hiatal hernia.  3. No focal pulmonary abnormality.  Original Report Authenticated By: Patterson Hammersmith, M.D.  Radiology results reviewed by me and show no acute findings, large hiatal hernia is unchanged from prior studies.  Date: 05/30/2011  Rate: 84  Rhythm: normal sinus rhythm  QRS Axis: normal  Intervals: normal  ST/T Wave abnormalities: nonspecific T wave changes  Conduction Disutrbances:none  Narrative Interpretation: Non-specific T wave flattening, no findings concerning for acute myocardial infarction or ischemia or arrhythmia.  Old EKG Reviewed: none available    Felisa Bonier, MD 05/30/11 [phone]

## 2011-05-30 NOTE — Progress Notes (Signed)
Pt confirms pcp is at [facility] but can not recall female MD's name

## 2011-05-30 NOTE — ED Notes (Signed)
Per MD verbal order, RN removed Foley catheter. Patient tolerated well. Encouraged patient to call for assistance if needed to void. Will continue to monitor.  Benay Spice RN, PCCN

## 2011-05-30 NOTE — [facility] Counselor (Cosign Needed)
Pending admission to [facility], and [facility]

## 2011-05-30 NOTE — Progress Notes (Signed)
Since foley catheter removal at 0955, patient just voided independently in the bathroom, and voided 100cc in hat collection device. I have since added this amount to intake/output charting for this shift. Will continue to monitor patient.  Benay Spice RN [facility]

## 2011-07-06 ENCOUNTER — Encounter (INDEPENDENT_AMBULATORY_CARE_PROVIDER_SITE_OTHER): Payer: Self-pay | Admitting: Surgery

## 2011-07-20 ENCOUNTER — Emergency Department (HOSPITAL_COMMUNITY): Payer: Medicare HMO

## 2011-07-20 ENCOUNTER — Encounter (HOSPITAL_COMMUNITY): Payer: Self-pay | Admitting: Emergency Medicine

## 2011-07-20 ENCOUNTER — Emergency Department (HOSPITAL_COMMUNITY)
Admission: EM | Admit: 2011-07-20 | Discharge: 2011-07-22 | Disposition: A | Discharge status: Other Acute Inpatient Hospital | Payer: Medicare HMO | Attending: Emergency Medicine | Admitting: Emergency Medicine

## 2011-07-20 DIAGNOSIS — K5900 Constipation, unspecified: Secondary | ICD-10-CM | POA: Insufficient documentation

## 2011-07-20 DIAGNOSIS — R45851 Suicidal ideations: Secondary | ICD-10-CM | POA: Insufficient documentation

## 2011-07-20 DIAGNOSIS — R339 Retention of urine, unspecified: Secondary | ICD-10-CM | POA: Insufficient documentation

## 2011-07-20 DIAGNOSIS — K625 Hemorrhage of anus and rectum: Secondary | ICD-10-CM | POA: Insufficient documentation

## 2011-07-20 DIAGNOSIS — F3289 Other specified depressive episodes: Secondary | ICD-10-CM | POA: Insufficient documentation

## 2011-07-20 DIAGNOSIS — F329 Major depressive disorder, single episode, unspecified: Secondary | ICD-10-CM | POA: Insufficient documentation

## 2011-07-20 DIAGNOSIS — R1084 Generalized abdominal pain: Secondary | ICD-10-CM | POA: Insufficient documentation

## 2011-07-20 DIAGNOSIS — K219 Gastro-esophageal reflux disease without esophagitis: Secondary | ICD-10-CM | POA: Insufficient documentation

## 2011-07-20 LAB — CBC
HCT: 42 % (ref 36.0–46.0)
Hemoglobin: 13.1 g/dL (ref 12.0–15.0)
MCH: 25.1 pg — ABNORMAL LOW (ref 26.0–34.0)
MCH: 25.1 pg — ABNORMAL LOW (ref 26.0–34.0)
MCHC: 31.9 g/dL (ref 30.0–36.0)
MCV: 78.7 fL (ref 78.0–100.0)
Platelets: 282 10*3/uL (ref 150–400)
Platelets: 261 10*3/uL (ref 150–400)
RBC: 5.21 MIL/uL — ABNORMAL HIGH (ref 3.87–5.11)
RDW: 14.1 % (ref 11.5–15.5)
WBC: 8.3 10*3/uL (ref 4.0–10.5)

## 2011-07-20 LAB — COMPREHENSIVE METABOLIC PANEL
ALT: 10 U/L (ref 0–35)
Albumin: 4.2 g/dL (ref 3.5–5.2)
Alkaline Phosphatase: 86 U/L (ref 39–117)
BUN: 9 mg/dL (ref 6–23)
BUN: 9 mg/dL (ref 6–23)
CO2: 25 mEq/L (ref 19–32)
Calcium: 10.2 mg/dL (ref 8.4–10.5)
Chloride: 103 mEq/L (ref 96–112)
Chloride: 105 mEq/L (ref 96–112)
Creatinine, Ser: 0.73 mg/dL (ref 0.50–1.10)
GFR calc Af Amer: >90 mL/min (ref >90)
GFR calc non Af Amer: 84 mL/min — ABNORMAL LOW (ref >90)
Glucose, Bld: 86 mg/dL (ref 70–99)
Glucose, Bld: 90 mg/dL (ref 70–99)
Potassium: 3.8 mEq/L (ref 3.5–5.1)
Sodium: 141 mEq/L (ref 135–145)
Total Bilirubin: 1 mg/dL (ref 0.3–1.2)
Total Bilirubin: 1.1 mg/dL (ref 0.3–1.2)
Total Protein: 7 g/dL (ref 6.0–8.3)

## 2011-07-20 LAB — URINALYSIS, ROUTINE W REFLEX MICROSCOPIC
Bilirubin Urine: NEGATIVE
Hgb urine dipstick: NEGATIVE
Ketones, ur: NEGATIVE mg/dL
Specific Gravity, Urine: 1.008 (ref 1.005–1.030)
pH: 6 (ref 5.0–8.0)

## 2011-07-20 LAB — DIFFERENTIAL
Basophils Relative: 0 % (ref 0–1)
Eosinophils Absolute: 0.1 10*3/uL (ref 0.0–0.7)
Lymphs Abs: 3 10*3/uL (ref 0.7–4.0)
Monocytes Relative: 7 % (ref 3–12)
Neutro Abs: 3.6 10*3/uL (ref 1.7–7.7)
Neutrophils Relative %: 50 % (ref 43–77)

## 2011-07-20 LAB — TYPE AND SCREEN

## 2011-07-20 LAB — ABO/RH: ABO/RH(D): A POS

## 2011-07-20 LAB — RAPID URINE DRUG SCREEN, HOSP PERFORMED
Amphetamines: NOT DETECTED
Benzodiazepines: NOT DETECTED
Opiates: NOT DETECTED

## 2011-07-20 MED ORDER — MIRTAZAPINE 15 MG PO TBDP
15.0000 mg | ORAL_TABLET | Freq: Once | ORAL | Status: AC
  Administered 2011-07-21: 15 mg via ORAL
  Filled 2011-07-20: qty 1

## 2011-07-20 MED ORDER — FLEET ENEMA 7-19 GM/118ML RE ENEM
1.0000 | ENEMA | Freq: Once | RECTAL | Status: AC
  Administered 2011-07-20: 1 via RECTAL
  Filled 2011-07-20: qty 1

## 2011-07-20 MED ORDER — IOHEXOL 300 MG/ML  SOLN
20.0000 mL | INTRAMUSCULAR | Status: AC
  Administered 2011-07-20 (×2): 20 mL via ORAL

## 2011-07-20 MED ORDER — MIRTAZAPINE 7.5 MG PO TABS
7.5000 mg | ORAL_TABLET | Freq: Every day | ORAL | Status: DC
  Filled 2011-07-20: qty 1

## 2011-07-20 MED ORDER — IOHEXOL 300 MG/ML  SOLN
100.0000 mL | Freq: Once | INTRAMUSCULAR | Status: AC | PRN
  Administered 2011-07-20: 100 mL via INTRAVENOUS

## 2011-07-20 MED ORDER — SODIUM CHLORIDE 0.9 % IV BOLUS (SEPSIS)
1000.0000 mL | Freq: Once | INTRAVENOUS | Status: AC
  Administered 2011-07-20: 1000 mL via INTRAVENOUS

## 2011-07-20 MED ORDER — ZIPRASIDONE MESYLATE 20 MG IM SOLR
10.0000 mg | Freq: Once | INTRAMUSCULAR | Status: AC
  Administered 2011-07-21: 10 mg via INTRAMUSCULAR
  Filled 2011-07-20: qty 20

## 2011-07-20 NOTE — ED Notes (Signed)
Rectal bleed x 10 days and cannot make water again and bp is up  abd pain

## 2011-07-20 NOTE — ED Notes (Signed)
Per Dr. March Rummage, the patient can eat (regular diet).

## 2011-07-20 NOTE — ED Notes (Signed)
EMT to ask patient what she wants to eat.

## 2011-07-20 NOTE — ED Notes (Signed)
Patient denies pain and is resting comfortably.  

## 2011-07-20 NOTE — ED Notes (Signed)
Dr performed Hemacult- states that it was positive.

## 2011-07-20 NOTE — ED Notes (Signed)
Telepsych called and paperwork faxed.

## 2011-07-20 NOTE — ED Provider Notes (Signed)
History     CSN: [id number]  Arrival date & time 07/20/11  1225   First MD Initiated Contact with Patient 07/20/11 1651      Chief Complaint  Patient presents with  . Rectal Bleeding    (Consider location/radiation/quality/duration/timing/severity/associated sxs/prior treatment) HPI history of present illness chief complaint: Constipation and rectal rectal bleeding and inability to urinate and suicidal thoughts. Patient arrived by private vehicle. History provided by patient. No language barriers identified. Information not limited. Onset of symptoms 10 days ago. Location pain generalized abdomen. Symptoms not improved or worsened by anything. Quality dull radiation none. Severity mild. Timing constant. Duration 10 days. Context the patient is unable to identify what may have led to her constipation and inability to urinate. Patient states her suicidal thoughts are in relation to her mother dying around this time of year and being sad over that. For associated signs and symptoms please refer to the review of systems. No treatments tried prior to arrival. Regarding recent medical care patient was seen at her primary care physician today who recommended the patient come for evaluation. Regarding patient's social history please refer to the nurse's notes. Patient denies any alcohol or drug abuse. I have reviewed the patient's past medical, past surgical, social history, as well as medications and allergies.  Past Medical History  Diagnosis Date  . GERD (gastroesophageal reflux disease)   . Mood disorder   . Depression     History reviewed. No pertinent past surgical history.  No family history on file.  History  Substance Use Topics  . Smoking status: Never Smoker   . Smokeless tobacco: Not on file  . Alcohol Use: No    OB History    Grav Para Term Preterm Abortions TAB SAB Ect Mult Living                  Review of Systems  Constitutional: Negative for fever and chills.    HENT: Negative for trouble swallowing, neck pain and neck stiffness.   Eyes: Negative for pain, discharge and itching.  Respiratory: Negative for cough, chest tightness and shortness of breath.   Cardiovascular: Negative for chest pain, palpitations and leg swelling.  Gastrointestinal: Positive for abdominal pain and constipation. Negative for nausea, vomiting, diarrhea, blood in stool and abdominal distention.  Genitourinary: Positive for decreased urine volume. Negative for dysuria, urgency, frequency, hematuria, flank pain, difficulty urinating and pelvic pain.  Musculoskeletal: Negative for back pain and joint swelling.  Skin: Negative for rash and wound.  Neurological: Negative for dizziness, tremors, seizures, syncope, facial asymmetry, speech difficulty, weakness, light-headedness, numbness and headaches.  Hematological: Negative for adenopathy. Does not bruise/bleed easily.  Psychiatric/Behavioral: Positive for suicidal ideas. Negative for confusion and decreased concentration.    Allergies  Dalmane; Penicillins; and Sulfa antibiotics  Home Medications   Current Outpatient Rx  Name Route Sig Dispense Refill  . VITAMIN B-12 IJ Injection Inject 1,000 mcg as directed every 30 (thirty) days. On the 20th of month      BP 157/84  Pulse 90  Temp(Src) 97.7 F (36.5 C) (Oral)  Resp 20  SpO2 97%  Physical Exam  Constitutional: She is oriented to person, place, and time. She appears well-developed and well-nourished. No distress.  HENT:  Head: Normocephalic and atraumatic.  Eyes: Conjunctivae are normal. Right eye exhibits no discharge. Left eye exhibits no discharge. No scleral icterus.  Neck: Normal range of motion. Neck supple.  Cardiovascular: Normal rate, regular rhythm, normal heart sounds and intact distal  pulses.   No murmur heard. Pulmonary/Chest: Effort normal and breath sounds normal. No respiratory distress. She has no wheezes. She has no rales. She exhibits no  tenderness.  Abdominal: Soft. Bowel sounds are normal. She exhibits no distension, no abdominal bruit, no pulsatile midline mass and no mass. There is no hepatosplenomegaly. There is generalized tenderness. There is no rigidity, no rebound, no guarding, no CVA tenderness, no tenderness at McBurney's point and negative Murphy's sign. No hernia.  Musculoskeletal: Normal range of motion. She exhibits no edema and no tenderness.  Neurological: She is alert and oriented to person, place, and time.  Skin: Skin is warm and dry. She is not diaphoretic.  Psychiatric: She has a normal mood and affect. Her speech is normal and behavior is normal. Thought content is not paranoid and not delusional. Cognition and memory are normal. She expresses suicidal ideation. She expresses no homicidal ideation. She expresses suicidal plans. She expresses no homicidal plans.    ED Course  Procedures (including critical care time)  Labs Reviewed  CBC - Abnormal; Notable for the following:    RBC 5.34 (*)    MCH 25.1 (*)    All other components within normal limits  COMPREHENSIVE METABOLIC PANEL - Abnormal; Notable for the following:    GFR calc non Af Amer 84 (*)    All other components within normal limits  CBC - Abnormal; Notable for the following:    RBC 5.21 (*)    MCH 25.1 (*)    All other components within normal limits  COMPREHENSIVE METABOLIC PANEL - Abnormal; Notable for the following:    GFR calc non Af Amer 72 (*)    GFR calc Af Amer 84 (*)    All other components within normal limits  PROTIME-INR  TYPE AND SCREEN  DIFFERENTIAL  LIPASE, BLOOD  URINALYSIS, ROUTINE W REFLEX MICROSCOPIC  URINE RAPID DRUG SCREEN (HOSP PERFORMED)  ABO/RH  URINE CULTURE   Ct Abdomen Pelvis W Contrast  07/20/2011  *RADIOLOGY REPORT*  Clinical Data: Blood in stool, constipation  CT ABDOMEN AND PELVIS WITH CONTRAST  Technique:  Multidetector CT imaging of the abdomen and pelvis was performed following the standard  protocol during bolus administration of intravenous contrast.  Contrast: OMNIPAQUE IOHEXOL 300 MG/ML  SOLN  Comparison: abdominal radiographs - earlier same day  Findings:  Evaluation of the upper abdomen is degraded secondary to patient motion artifact.  Normal hepatic contour.  No discrete hepatic lesions.  Normal gallbladder given motion artifact.  No definite intra or extrahepatic biliary duct dilatation.  No ascites.  There is symmetric enhancement and excretion of the bilateral kidneys no discrete renal lesions.  No definite renal stones.  No urinary obstruction.  No perinephric stranding.  There is mild thickening of the crux and medial limb of the left adrenal gland without discrete nodule.  Normal appearance of the right adrenal gland, pancreas and spleen.  Moderate sized sliding hiatal hernia.  Ingested enteric contrast extends to the level of the transverse colon.  No enteric obstruction.  Scattered minimal colonic diverticulosis without evidence of diverticulitis.  The bowel is otherwise normal in course and caliber without wall thickening or evidence of obstruction.  The appendix is not definitely identified, however there is inflammatory change within the right lower abdominal quadrant.  No pneumoperitoneum, pneumatosis or portal venous gas.  Normal caliber of the abdominal aorta.  The major branch vessels of the abdominal aorta are patent.  No retroperitoneal, mesenteric, pelvic or inguinal lymphadenopathy.  Post hysterectomy.  No free fluid in the pelvis. A Foley catheter and small amount of air is seen within the urinary bladder.  Limited visualization of the lower thorax demonstrates a large sliding hiatal hernia.  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  No focal airspace opacities.  No pleural effusion or pneumothorax.  Normal heart size.  No pericardial effusion.  No acute or aggressive osseous abnormalities.  Mild to moderate multilevel  lower thoracic and lumbar spine DDD with scoliotic curvature of the thoracolumbar spine.  Post intramedullary rod fixation of the right femur, incompletely imaged.  IMPRESSION:  1.  No acute findings in the abdomen or pelvis.  Specifically, no evidence of enteric urinary obstruction.  2.  Moderate sized sliding hiatal hernia.  Original Report Authenticated By: Waynard Reeds, M.D.   Dg Abd Acute W/chest  07/20/2011  *RADIOLOGY REPORT*  Clinical Data: Rectal bleeding, abdominal pain and nausea.  ACUTE ABDOMEN SERIES (ABDOMEN 2 VIEW & CHEST 1 VIEW)  Comparison: Abdominal film 03/28/2011 and chest x-ray 10/06/2009.  Findings: The heart is enlarged but stable.  Basilar scarring changes are noted on the left.  No acute pulmonary findings.  Two views of the abdomen demonstrate moderate air and stool throughout the colon and moderate air throughout the small bowel. Findings could be due to a mild ileus or gastroenteritis.  No findings for small bowel obstruction or free air.  The soft tissue shadows are grossly maintained.  The bony structures are intact. Lumbar scoliosis and degenerative disease is again noted.  IMPRESSION:  1.  Cardiac enlargement and large hiatal hernia but no acute pulmonary findings. 2.  Abdominal bowel gas pattern could suggest gastroenteritis or ileus.  Original Report Authenticated By: P. Loralie Champagne, M.D.     1. Suicidal ideation   2. Constipation   3. Urinary retention       MDM  Pt is a well-appearing 71 year old Caucasian female who presents from her PCPs office today for 10 days of constipation and 10 days of inability to urinate. Patient also states she suicidal with plan to possibly hang herself . Suicidal thoughts stem from the patient's mother dying around this time of year. Patient also admits to rectal bleeding once 10 days ago and then a second episode today when she attempted to have a bowel movement. Small amount of bright red blood noted in the toilet. No fevers, no  vomiting, no chest pain, no difficulty breathing, no palpitations, no belching. Patient has been passing flatus. Of note there is a history of these symptoms in the patient's chart before and at that time the patient was admitted and a foley catheter was placed which did not show any sort of obstruction and there was no renal failure. There is comment that at that time the physician is unsure what the patient's reported symptoms were truthful. Abdominal exam today is reassuring. Very mild tenderness and certainly no peritoneal findings. Rectal exam demonstrates small soft noncompressible hemorrhoid externally with no impaction of stool and brownish stool with maroon tinged. Moderately heme positive. Nonpainful rectal exam as well. Some small bowel obstruction it is doubtful at this time. Acute abdominal series shows no air fluid levels. CT abdomen unremarkable. Basic labs pending. Foley catheter and urine studies ordered as well. Tele psych consult pending as well.  4 cath was inserted and approximately 200 mL of clear urine was obtained. Given patient reports 10 days of no voiding but only 200 mL  obtained by Foley catheterization the patient has normal renal function and patient appears well and strongly and concerned the patient was not being truthful in regards to these symptoms. Labs as far largely unremarkable. And was given in addition to 2 formed bowel movements. Psychiatry recommends admission for the suicidal thoughts. Mirtazapine was also ordered in conjunction with psychiatry. The patient refused this and became agitated. Please see nurse's notes in regards to this. Geodon was given. Patient calmed down appropriately. Plan for admission to psychiatry service.        Consuello Masse, MD 07/21/11 [phone]

## 2011-07-20 NOTE — ED Notes (Signed)
CT notified, pt finished oral contrast

## 2011-07-20 NOTE — ED Notes (Signed)
Pt states "i want to die because i am under a lot of stress.  My mama died on mothers day and its getting close.  i took a knife and i was going to cut myself."

## 2011-07-20 NOTE — ED Notes (Signed)
Patient transported to CT 

## 2011-07-20 NOTE — ED Notes (Signed)
Occult stool was positive (per resident).

## 2011-07-20 NOTE — ED Notes (Signed)
MD speaking with patient via telepsych computer.

## 2011-07-20 NOTE — ED Notes (Signed)
ACT team at bedside.  

## 2011-07-21 MED ORDER — ZIPRASIDONE MESYLATE 20 MG IM SOLR: 10.0000 mg | Freq: Once | INTRAMUSCULAR | Status: DC

## 2011-07-21 MED ORDER — ACETAMINOPHEN 160 MG/5ML PO SOLN
325.0000 mg | Freq: Once | ORAL | Status: AC
  Administered 2011-07-21: 325 mg via ORAL
  Filled 2011-07-21: qty 20.3

## 2011-07-21 MED ORDER — ZIPRASIDONE MESYLATE 20 MG IM SOLR
INTRAMUSCULAR | Status: AC
  Filled 2011-07-21: qty 20

## 2011-07-21 NOTE — BH Assessment (Addendum)
Assessment Note  Spoke with Amil Amen at [facility].  She reports it is fine for patient to come in the morning.  They will hold his bed.   Left message with [facility] to notify them that the patient is on the list for transport in the morning and requesting they hold her bed.

## 2011-07-21 NOTE — ED Notes (Signed)
Lunch tray ordered 

## 2011-07-21 NOTE — Discharge Instructions (Signed)
Please go directly to your psychiatric care facility.

## 2011-07-21 NOTE — ED Provider Notes (Signed)
Patient with no acute medical issues this morning.  She is currently under review for possible admission at behavioral health.  Nat Christen, MD 07/21/11 0730

## 2011-07-21 NOTE — ED Notes (Signed)
Pt sleeping and snoring.  Sitter at bedside.

## 2011-07-21 NOTE — ED Provider Notes (Signed)
I saw and evaluated the patient, reviewed the resident's note and I agree with the findings and plan.  "10 days of constipation and unable to urinate". Also suicidal with plan to hang self. Abdomen soft, mild suprapubic tenderness. Small hemorrhoid on exam. Hemoglobin stable.  Glynn Octave, MD 07/21/11 [phone]

## 2011-07-21 NOTE — ED Notes (Signed)
Informed by police officer that they can only transport to [place] & not to [place], informed the [facility] had to transfer & the sheriff's office stops transferring at 7pm, charge RN informed, pt on list to be transferred tomorrow am, pt notified
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
## 2011-07-22 NOTE — ED Notes (Signed)
This RN called Shannon Hurst to give an update assessment, left a message on answering machine w/contact info., pt transported by [employer]

## 2011-07-23 LAB — URINE CULTURE
Colony Count: 15000
Culture  Setup Time: 201305021923

## 2011-07-24 NOTE — ED Notes (Signed)
+   Urine Patient transferred to [facility].

## 2011-08-07 ENCOUNTER — Ambulatory Visit (INDEPENDENT_AMBULATORY_CARE_PROVIDER_SITE_OTHER): Payer: Medicare HMO | Admitting: Surgery

## 2011-08-16 ENCOUNTER — Encounter (INDEPENDENT_AMBULATORY_CARE_PROVIDER_SITE_OTHER): Payer: Self-pay | Admitting: Surgery

## 2011-09-25 ENCOUNTER — Emergency Department (HOSPITAL_COMMUNITY)
Admission: EM | Admit: 2011-09-25 | Discharge: 2011-09-25 | Disposition: A | Discharge status: Home / Self Care | Payer: Medicare HMO | Attending: Emergency Medicine | Admitting: Emergency Medicine

## 2011-09-25 ENCOUNTER — Encounter (INDEPENDENT_AMBULATORY_CARE_PROVIDER_SITE_OTHER): Payer: Self-pay

## 2011-09-25 ENCOUNTER — Encounter (HOSPITAL_COMMUNITY): Payer: Self-pay | Admitting: Emergency Medicine

## 2011-09-25 ENCOUNTER — Emergency Department (HOSPITAL_COMMUNITY): Payer: Medicare HMO

## 2011-09-25 DIAGNOSIS — F3289 Other specified depressive episodes: Secondary | ICD-10-CM | POA: Insufficient documentation

## 2011-09-25 DIAGNOSIS — R109 Unspecified abdominal pain: Secondary | ICD-10-CM | POA: Insufficient documentation

## 2011-09-25 DIAGNOSIS — F329 Major depressive disorder, single episode, unspecified: Secondary | ICD-10-CM

## 2011-09-25 DIAGNOSIS — K5900 Constipation, unspecified: Secondary | ICD-10-CM | POA: Insufficient documentation

## 2011-09-25 DIAGNOSIS — K219 Gastro-esophageal reflux disease without esophagitis: Secondary | ICD-10-CM | POA: Insufficient documentation

## 2011-09-25 LAB — URINALYSIS, ROUTINE W REFLEX MICROSCOPIC
Hgb urine dipstick: NEGATIVE
Nitrite: NEGATIVE
Protein, ur: NEGATIVE mg/dL
Urobilinogen, UA: 1 mg/dL (ref 0.0–1.0)

## 2011-09-25 LAB — CBC WITH DIFFERENTIAL/PLATELET
Eosinophils Relative: 1 % (ref 0–5)
HCT: 44.7 % (ref 36.0–46.0)
Hemoglobin: 14.1 g/dL (ref 12.0–15.0)
Lymphocytes Relative: 37 % (ref 12–46)
MCV: 79.7 fL (ref 78.0–100.0)
Monocytes Absolute: 0.7 10*3/uL (ref 0.1–1.0)
Monocytes Relative: 9 % (ref 3–12)
Neutro Abs: 4.2 10*3/uL (ref 1.7–7.7)
WBC: 7.8 10*3/uL (ref 4.0–10.5)

## 2011-09-25 LAB — URINE MICROSCOPIC-ADD ON

## 2011-09-25 LAB — RAPID URINE DRUG SCREEN, HOSP PERFORMED
Amphetamines: NOT DETECTED
Barbiturates: NOT DETECTED
Benzodiazepines: NOT DETECTED
Cocaine: NOT DETECTED
Opiates: NOT DETECTED
Tetrahydrocannabinol: NOT DETECTED

## 2011-09-25 LAB — COMPREHENSIVE METABOLIC PANEL
BUN: 11 mg/dL (ref 6–23)
CO2: 26 mEq/L (ref 19–32)
Calcium: 10.4 mg/dL (ref 8.4–10.5)
Chloride: 102 mEq/L (ref 96–112)
Creatinine, Ser: 0.81 mg/dL (ref 0.50–1.10)
GFR calc Af Amer: 83 mL/min — ABNORMAL LOW (ref >90)
GFR calc non Af Amer: 71 mL/min — ABNORMAL LOW (ref >90)
Glucose, Bld: 78 mg/dL (ref 70–99)
Total Bilirubin: 1.1 mg/dL (ref 0.3–1.2)

## 2011-09-25 LAB — ETHANOL: Alcohol, Ethyl (B): <11 mg/dL (ref 0–11)

## 2011-09-25 LAB — OCCULT BLOOD, POC DEVICE: Fecal Occult Bld: POSITIVE

## 2011-09-25 MED ORDER — MAGNESIUM CITRATE PO SOLN
1.0000 | Freq: Once | ORAL | Status: DC
  Filled 2011-09-25: qty 296

## 2011-09-25 NOTE — ED Notes (Signed)
Pt awaiting PTAR for transport home 

## 2011-09-25 NOTE — ED Notes (Signed)
Pt. Stated, I have not had any bowels in 10 days and my nerves are acting up
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
## 2011-09-25 NOTE — ED Provider Notes (Signed)
History     CSN: [id number]  Arrival date & time 09/25/11  1128   First MD Initiated Contact with Patient 09/25/11 1415      Chief Complaint  Patient presents with  . Abdominal Pain    (Consider location/radiation/quality/duration/timing/severity/associated sxs/prior treatment) Patient is a 71 y.o. female presenting with abdominal pain. The history is provided by the patient.  Abdominal Pain The primary symptoms of the illness include abdominal pain.  She says that she has not been able to move her bowels in the last 10 days. She states she's not passed any flatus in that time. There's been a small amount of rectal bleeding. She states she's also not making very much urine at all. She denies abdominal pain, nausea, vomiting. She denies fever, chills, sweats. She also states that she's been depressed and has suicidal ideation of cutting her self with a knife. She denies crying spells and anhedonia but she has had sleep disturbance. She's also had auditory hallucinations with voices telling her to get help. She has not tried any treatment at home.  Past Medical History  Diagnosis Date  . GERD (gastroesophageal reflux disease)   . Mood disorder   . Depression     History reviewed. No pertinent past surgical history.  No family history on file.  History  Substance Use Topics  . Smoking status: Never Smoker   . Smokeless tobacco: Not on file  . Alcohol Use: No    OB History    Grav Para Term Preterm Abortions TAB SAB Ect Mult Living                  Review of Systems  Gastrointestinal: Positive for abdominal pain.  All other systems reviewed and are negative.    Allergies  Dalmane; Penicillins; and Sulfa antibiotics  Home Medications   Current Outpatient Rx  Name Route Sig Dispense Refill  . VITAMIN B-12 IJ Injection Inject 1,000 mcg as directed every 30 (thirty) days. On the 20th of month      BP 157/80  Pulse 90  Temp 97.8 F (36.6 C) (Oral)  Resp 18  SpO2  98%  Physical Exam  Nursing note and vitals reviewed.  71 year old female is resting currently in no acute distress. Vital signs are significant for mild hypertension with blood pressure 157/80. Head is normocephalic and atraumatic. PERRLA, EOMI. There is no scleral icterus. Her Chalmers Guest is clear. Neck is nontender and supple. Back is nontender. Lungs are clear without rales, wheezes, rhonchi. Heart has regular rate rhythm without murmur. Abdomen is soft, flat, nontender without masses or hepatosplenomegaly. There's no CVA tenderness. Rectal is a small amount of bile slightly hard stool but no impaction. Specimen was sent for Hemoccult testing. No hemorrhoids or fissures are seen or felt. Extremities have trace edema, no cyanosis. Spider veins are present without overt varicosities. Skin is warm and dry without other rash. Neurologic: Mental status is normal, cranial nerves are intact, there no motor or sensory deficits.  ED Course  Procedures (including critical care time)  Results for orders placed during the hospital encounter of 09/25/11  CBC WITH DIFFERENTIAL      Component Value Range   WBC 7.8  4.0 - 10.5 K/uL   RBC 5.61 (*) 3.87 - 5.11 MIL/uL   Hemoglobin 14.1  12.0 - 15.0 g/dL   HCT 40.9  81.1 - 91.4 %   MCV 79.7  78.0 - 100.0 fL   MCH 25.1 (*) 26.0 - 34.0 pg  MCHC 31.5  30.0 - 36.0 g/dL   RDW 86.5  78.4 - 69.6 %   Platelets 241  150 - 400 K/uL   Neutrophils Relative 53  43 - 77 %   Neutro Abs 4.2  1.7 - 7.7 K/uL   Lymphocytes Relative 37  12 - 46 %   Lymphs Abs 2.9  0.7 - 4.0 K/uL   Monocytes Relative 9  3 - 12 %   Monocytes Absolute 0.7  0.1 - 1.0 K/uL   Eosinophils Relative 1  0 - 5 %   Eosinophils Absolute 0.1  0.0 - 0.7 K/uL   Basophils Relative 0  0 - 1 %   Basophils Absolute 0.0  0.0 - 0.1 K/uL  COMPREHENSIVE METABOLIC PANEL      Component Value Range   Sodium 140  135 - 145 mEq/L   Potassium 3.6  3.5 - 5.1 mEq/L   Chloride 102  96 - 112 mEq/L   CO2 26  19 - 32  mEq/L   Glucose, Bld 78  70 - 99 mg/dL   BUN 11  6 - 23 mg/dL   Creatinine, Ser 2.95  0.50 - 1.10 mg/dL   Calcium 28.4  8.4 - 13.2 mg/dL   Total Protein 7.7  6.0 - 8.3 g/dL   Albumin 4.5  3.5 - 5.2 g/dL   AST 17  0 - 37 U/L   ALT 13  0 - 35 U/L   Alkaline Phosphatase 99  39 - 117 U/L   Total Bilirubin 1.1  0.3 - 1.2 mg/dL   GFR calc non Af Amer 71 (*) >90 mL/min   GFR calc Af Amer 83 (*) >90 mL/min  URINALYSIS, ROUTINE W REFLEX MICROSCOPIC      Component Value Range   Color, Urine YELLOW  YELLOW   APPearance CLEAR  CLEAR   Specific Gravity, Urine 1.022  1.005 - 1.030   pH 6.5  5.0 - 8.0   Glucose, UA NEGATIVE  NEGATIVE mg/dL   Hgb urine dipstick NEGATIVE  NEGATIVE   Bilirubin Urine NEGATIVE  NEGATIVE   Ketones, ur NEGATIVE  NEGATIVE mg/dL   Protein, ur NEGATIVE  NEGATIVE mg/dL   Urobilinogen, UA 1.0  0.0 - 1.0 mg/dL   Nitrite NEGATIVE  NEGATIVE   Leukocytes, UA SMALL (*) NEGATIVE  ETHANOL      Component Value Range   Alcohol, Ethyl (B) <11  0 - 11 mg/dL  URINE RAPID DRUG SCREEN (HOSP PERFORMED)      Component Value Range   Opiates NONE DETECTED  NONE DETECTED   Cocaine NONE DETECTED  NONE DETECTED   Benzodiazepines NONE DETECTED  NONE DETECTED   Amphetamines NONE DETECTED  NONE DETECTED   Tetrahydrocannabinol NONE DETECTED  NONE DETECTED   Barbiturates NONE DETECTED  NONE DETECTED  OCCULT BLOOD, POC DEVICE      Component Value Range   Fecal Occult Bld POSITIVE    URINE MICROSCOPIC-ADD ON      Component Value Range   Squamous Epithelial / LPF FEW (*) RARE   WBC, UA 3-6  <3 WBC/hpf   Bacteria, UA RARE  RARE   Dg Abd 1 View  09/25/2011  *RADIOLOGY REPORT*  Clinical Data: Abdominal pain  ABDOMEN - 1 VIEW  Comparison: 07/20/2011  Findings: Degenerative changes of the spine with an associated levoscoliosis.  Postop changes of the right hip, where an IM rod is noted with surrounding heterotopic ossification.  Scattered air and stool throughout the bowel without definite  obstruction.  No significant dilatation or ileus.  Left pelvic calcification compatible with venous phlebolith.  IMPRESSION: Nonobstructive bowel gas pattern.  Original Report Authenticated By: Judie Petit. Ruel Favors, M.D.      1. Constipation   2. Depression       MDM  Complaints of inability to move bowels and inability to urinate of uncertain validity. Main complaint seems to be depression. She actually does not seem overtly depressed and has a normal affect. Screening labs will be obtained and a KUB obtained to evaluate fecal load. Rectal exam does show some slightly hard stool but no actual impaction and she will be given a laxative. Psychiatric consultation will be obtained. Old records are reviewed and she had a virtually identical presentation at 2 months ago.  Psychiatry consultation has been obtained and psychiatrist does not feel that she needs inpatient care. She will be discharged. She was given a dose of magnesium citrate per her constipation.      Dione Booze, MD 09/25/11 [phone]

## 2011-09-25 NOTE — Progress Notes (Signed)
The patient walked in today complaining of rectal bleeding.  Marlowe Aschoff, RN spoke to the pt.  She has had no bowel movement in 10 days.  She was seen in the ER back in May.  Pt has never been evaluated by [facility].  The pt did not appear to be in any distress.  She was alert and oriented x4.   She was referred to be evaluated by her Primary MD or the ED.  The pt chose the ED but will go after she eats.  She left via [employer].

## 2011-10-10 ENCOUNTER — Emergency Department (HOSPITAL_COMMUNITY): Payer: Medicare HMO

## 2011-10-10 ENCOUNTER — Emergency Department (HOSPITAL_COMMUNITY)
Admission: EM | Admit: 2011-10-10 | Discharge: 2011-10-13 | Disposition: A | Discharge status: Home / Self Care | Payer: Medicare HMO | Attending: Emergency Medicine | Admitting: Emergency Medicine

## 2011-10-10 ENCOUNTER — Encounter (HOSPITAL_COMMUNITY): Payer: Self-pay | Admitting: Emergency Medicine

## 2011-10-10 DIAGNOSIS — R1310 Dysphagia, unspecified: Secondary | ICD-10-CM | POA: Insufficient documentation

## 2011-10-10 DIAGNOSIS — K219 Gastro-esophageal reflux disease without esophagitis: Secondary | ICD-10-CM | POA: Insufficient documentation

## 2011-10-10 DIAGNOSIS — F329 Major depressive disorder, single episode, unspecified: Secondary | ICD-10-CM | POA: Insufficient documentation

## 2011-10-10 DIAGNOSIS — N390 Urinary tract infection, site not specified: Secondary | ICD-10-CM

## 2011-10-10 DIAGNOSIS — R45851 Suicidal ideations: Secondary | ICD-10-CM | POA: Insufficient documentation

## 2011-10-10 DIAGNOSIS — K5900 Constipation, unspecified: Secondary | ICD-10-CM | POA: Insufficient documentation

## 2011-10-10 DIAGNOSIS — F3289 Other specified depressive episodes: Secondary | ICD-10-CM | POA: Insufficient documentation

## 2011-10-10 LAB — COMPREHENSIVE METABOLIC PANEL
ALT: 14 U/L (ref 0–35)
AST: 24 U/L (ref 0–37)
Albumin: 3.9 g/dL (ref 3.5–5.2)
CO2: 24 mEq/L (ref 19–32)
Calcium: 10 mg/dL (ref 8.4–10.5)
GFR calc non Af Amer: 83 mL/min — ABNORMAL LOW (ref >90)
Sodium: 135 mEq/L (ref 135–145)
Total Protein: 7.1 g/dL (ref 6.0–8.3)

## 2011-10-10 LAB — URINALYSIS, ROUTINE W REFLEX MICROSCOPIC
Bilirubin Urine: NEGATIVE
Ketones, ur: NEGATIVE mg/dL
Nitrite: NEGATIVE
Urobilinogen, UA: 0.2 mg/dL (ref 0.0–1.0)

## 2011-10-10 LAB — RAPID URINE DRUG SCREEN, HOSP PERFORMED
Amphetamines: NOT DETECTED
Barbiturates: NOT DETECTED
Benzodiazepines: NOT DETECTED
Cocaine: NOT DETECTED
Tetrahydrocannabinol: NOT DETECTED

## 2011-10-10 LAB — CBC
MCH: 25 pg — ABNORMAL LOW (ref 26.0–34.0)
Platelets: 269 10*3/uL (ref 150–400)
RBC: 5.2 MIL/uL — ABNORMAL HIGH (ref 3.87–5.11)
RDW: 14.7 % (ref 11.5–15.5)

## 2011-10-10 MED ORDER — IBUPROFEN 200 MG PO TABS: 600.0000 mg | ORAL_TABLET | Freq: Three times a day (TID) | ORAL | Status: DC | PRN

## 2011-10-10 MED ORDER — ONDANSETRON HCL 4 MG PO TABS: 4.0000 mg | ORAL_TABLET | Freq: Three times a day (TID) | ORAL | Status: DC | PRN

## 2011-10-10 MED ORDER — CIPROFLOXACIN 500 MG/5ML (10%) PO SUSR: 500.0000 mg | Freq: Two times a day (BID) | ORAL | Status: DC

## 2011-10-10 MED ORDER — ALUM & MAG HYDROXIDE-SIMETH 200-200-20 MG/5ML PO SUSP: 30.0000 mL | ORAL | Status: DC | PRN

## 2011-10-10 MED ORDER — RISPERIDONE 0.5 MG PO TABS
0.5000 mg | ORAL_TABLET | Freq: Every day | ORAL | Status: DC
  Administered 2011-10-11 – 2011-10-13 (×3): 0.5 mg via ORAL
  Filled 2011-10-10 (×3): qty 1

## 2011-10-10 MED ORDER — ACETAMINOPHEN 325 MG PO TABS: 650.0000 mg | ORAL_TABLET | ORAL | Status: DC | PRN

## 2011-10-10 MED ORDER — ACETAMINOPHEN 160 MG/5ML PO SOLN
650.0000 mg | Freq: Once | ORAL | Status: AC
  Administered 2011-10-10: 650 mg via ORAL
  Filled 2011-10-10: qty 20.3

## 2011-10-10 MED ORDER — CIPROFLOXACIN HCL 500 MG PO TABS
500.0000 mg | ORAL_TABLET | Freq: Two times a day (BID) | ORAL | Status: DC
  Administered 2011-10-10 – 2011-10-13 (×7): 500 mg via ORAL
  Filled 2011-10-10 (×7): qty 1

## 2011-10-10 NOTE — ED Provider Notes (Signed)
Patient signed out to me by Dr. Lars Mage. Patient had telemetry psychiatry consultation and recommended inpatient hospitalization as well as patient to be started on risperdal. Act team working on placement  Toy Baker, MD 10/10/11 2209

## 2011-10-10 NOTE — Progress Notes (Signed)
SLP Cancellation Note  Evaluation cancelled today due to pt's dysphagia being out of SLP scope of practice - pt without an oropharyngeal dysphagia per esophagram.  SLP spoke to RN to inform her of findings.    Please reconsult if concerned pt may have oropharyngeal involvement.  Thanks.  Donavan Burnet, MS [facility] SLP [phone]    Dg Esophagus  10/10/2011  *RADIOLOGY REPORT*  Clinical Data:  Trouble swallowing.  Dysphagia  UPPER GI SERIES WITHOUT KUB  Technique:  Routine upper GI series was performed with thin barium.  Fluoroscopy Time: 1.1 minutes  Comparison:  Chest x-ray 05/30/2011  Findings: Large hiatal hernia is present with approximately one half of the stomach in the chest.  Esophageal motility is diminished with dilatation of the esophagus. No stricture or mass was identified.  The patient refused to swallow a barium tablet.  The stomach emptied into the duodenum.  The stomach is not dilated.  IMPRESSION: Large hiatal hernia  Decreased esophageal motility.  Original Report Authenticated By: Camelia Phenes, M.D.   Dg Abd Acute W/chest  10/10/2011  *RADIOLOGY REPORT*  Clinical Data: Medical clearance.  No bowel movement for 10 days.  ACUTE ABDOMEN SERIES (ABDOMEN 2 VIEW & CHEST 1 VIEW)  Comparison: Chest and two views abdomen 10/14/2008.  Findings: Single view of the chest again demonstrates a hiatal hernia.  Lungs are clear.  No pneumothorax or pleural fluid.  Two views of the abdomen show no evidence of bowel obstruction.  No free intraperitoneal air is identified.  Convex left scoliosis is noted.  IM nail in the right femur is again seen.  IMPRESSION: No acute finding chest or abdomen.  Hiatal hernia.  Original Report Authenticated By: Bernadene Bell. Maricela Curet, M.D.

## 2011-10-10 NOTE — ED Notes (Signed)
States wants to be admitted to behavior [facility], hasn't been able to sleep in 10 days, no BM for 10 days--requesting a fleets enema.

## 2011-10-10 NOTE — ED Notes (Signed)
Has gone to xray for barium swallow.

## 2011-10-10 NOTE — ED Notes (Signed)
Telepsych request initiated.  Anticipated time of consult should be around 5pm.  They said it is a very busy day for psych consults today.

## 2011-10-10 NOTE — ED Notes (Signed)
Telepsych in progress. 

## 2011-10-10 NOTE — ED Provider Notes (Signed)
History     CSN: [id number]  Arrival date & time 10/10/11  0750   First MD Initiated Contact with Patient 10/10/11 [phone]      Chief Complaint  Patient presents with  . Medical Clearance  . Constipation  . Urinary Retention    (Consider location/radiation/quality/duration/timing/severity/associated sxs/prior treatment) HPI  Patient brings herself to the emergency department. She states she has not had a bowel movement for 10 days and has not had any urine output for the past 10 days. She states she also has some bleeding rectally. She denies any nausea or vomiting and states she's never had this before. However looking at her prior ED record she was seen here on July 8 with the exact same complaints. She relates this morning she felt depressed because she woke up at 3 AM and could not fall back asleep. She states she would take a coat hanger and indicates she would cut across her throat. She reports that she likes to cook and she ate yesterday. She reports however she has inability to swallow pills and is having trouble swallowing foods and liquids. She states this has been going on for a few weeks. She denies fever or coughing. She states she's had a weight loss of about 10 pounds. Patient states she was admitted at [facility] about 6 months ago for depression. She states however she was discharged on no medications because she can't swallow pills.  PCP Dr. Andrey Campanile at [facility]. She states she was last seen a week ago for swelling in her legs.  Past Medical History  Diagnosis Date  . GERD (gastroesophageal reflux disease)   . Mood disorder   . Depression     Past Surgical History  Procedure Date  . Hip surgery   . Arm surgery    . Abdominal hysterectomy     No family history on file.  History  Substance Use Topics  . Smoking status: Never Smoker   . Smokeless tobacco: Not on file  . Alcohol Use: No   lives alone Lives in an  apartment Uses a walker  OB History    Grav Para Term Preterm Abortions TAB SAB Ect Mult Living                  Review of Systems  All other systems reviewed and are negative.    Allergies  Dalmane; Penicillins; and Sulfa antibiotics  Home Medications  No current outpatient prescriptions on file.  BP 128/51  Pulse 82  Temp 97.8 F (36.6 C) (Oral)  Resp 20  SpO2 100%  Vital signs normal    Physical Exam  Nursing note and vitals reviewed. Constitutional: She is oriented to person, place, and time. She appears well-developed and well-nourished.  Non-toxic appearance. She does not appear ill. No distress.  HENT:  Head: Normocephalic and atraumatic.  Right Ear: External ear normal.  Left Ear: External ear normal.  Nose: Nose normal. No mucosal edema or rhinorrhea.  Mouth/Throat: Oropharynx is clear and moist and mucous membranes are normal. No dental abscesses or uvula swelling.  Eyes: Conjunctivae and EOM are normal. Pupils are equal, round, and reactive to light.  Neck: Normal range of motion and full passive range of motion without pain. Neck supple.  Cardiovascular: Normal rate, regular rhythm and normal heart sounds.  Exam reveals no gallop and no friction rub.   No murmur heard. Pulmonary/Chest: Effort normal and breath sounds normal. No respiratory distress. She has  no wheezes. She has no rhonchi. She has no rales. She exhibits no tenderness and no crepitus.  Abdominal: Soft. Normal appearance and bowel sounds are normal. She exhibits no distension. There is no tenderness. There is no rebound and no guarding.  Musculoskeletal: Normal range of motion. She exhibits no edema and no tenderness.       Moves all extremities well.   Neurological: She is alert and oriented to person, place, and time. She has normal strength. No cranial nerve deficit.  Skin: Skin is warm, dry and intact. No rash noted. No erythema. No pallor.  Psychiatric: She has a normal mood and affect.  Her speech is normal and behavior is normal. Her mood appears not anxious.       Smiling, interacting with staff, does not appear depressed    ED Course  Procedures (including critical care time)  Patient was also seen in the ED on May 2 had a CT scan of her abdomen done which showed a moderate size sliding hernia. At that point she was admitted to a psychiatric hospital.  Pt waiting for telepsych evaluation at change of shift.   Results for orders placed during the hospital encounter of 10/10/11  CBC      Component Value Range   WBC 6.3  4.0 - 10.5 K/uL   RBC 5.20 (*) 3.87 - 5.11 MIL/uL   Hemoglobin 13.0  12.0 - 15.0 g/dL   HCT 13.2  44.0 - 10.2 %   MCV 79.0  78.0 - 100.0 fL   MCH 25.0 (*) 26.0 - 34.0 pg   MCHC 31.6  30.0 - 36.0 g/dL   RDW 72.5  36.6 - 44.0 %   Platelets 269  150 - 400 K/uL  COMPREHENSIVE METABOLIC PANEL      Component Value Range   Sodium 135  135 - 145 mEq/L   Potassium 3.7  3.5 - 5.1 mEq/L   Chloride 100  96 - 112 mEq/L   CO2 24  19 - 32 mEq/L   Glucose, Bld 119 (*) 70 - 99 mg/dL   BUN 10  6 - 23 mg/dL   Creatinine, Ser 3.47  0.50 - 1.10 mg/dL   Calcium 42.5  8.4 - 95.6 mg/dL   Total Protein 7.1  6.0 - 8.3 g/dL   Albumin 3.9  3.5 - 5.2 g/dL   AST 24  0 - 37 U/L   ALT 14  0 - 35 U/L   Alkaline Phosphatase 102  39 - 117 U/L   Total Bilirubin 0.9  0.3 - 1.2 mg/dL   GFR calc non Af Amer 83 (*) >90 mL/min   GFR calc Af Amer >90  >90 mL/min  ETHANOL      Component Value Range   Alcohol, Ethyl (B) <11  0 - 11 mg/dL  URINE RAPID DRUG SCREEN (HOSP PERFORMED)      Component Value Range   Opiates NONE DETECTED  NONE DETECTED   Cocaine NONE DETECTED  NONE DETECTED   Benzodiazepines NONE DETECTED  NONE DETECTED   Amphetamines NONE DETECTED  NONE DETECTED   Tetrahydrocannabinol NONE DETECTED  NONE DETECTED   Barbiturates NONE DETECTED  NONE DETECTED  URINALYSIS, ROUTINE W REFLEX MICROSCOPIC      Component Value Range   Color, Urine YELLOW  YELLOW    APPearance CLEAR  CLEAR   Specific Gravity, Urine 1.011  1.005 - 1.030   pH 5.5  5.0 - 8.0   Glucose, UA NEGATIVE  NEGATIVE mg/dL  Hgb urine dipstick MODERATE (*) NEGATIVE   Bilirubin Urine NEGATIVE  NEGATIVE   Ketones, ur NEGATIVE  NEGATIVE mg/dL   Protein, ur NEGATIVE  NEGATIVE mg/dL   Urobilinogen, UA 0.2  0.0 - 1.0 mg/dL   Nitrite NEGATIVE  NEGATIVE   Leukocytes, UA NEGATIVE  NEGATIVE  URINE MICROSCOPIC-ADD ON      Component Value Range   Squamous Epithelial / LPF FEW (*) RARE   WBC, UA 7-10  <3 WBC/hpf   RBC / HPF 3-6  <3 RBC/hpf   Bacteria, UA MANY (*) RARE   Laboratory interpretation all normal except possible UTI   Dg Abd 1 View  09/25/2011  *RADIOLOGY REPORT*  Clinical Data: Abdominal pain  ABDOMEN - 1 VIEW  Comparison: 07/20/2011  Findings: Degenerative changes of the spine with an associated levoscoliosis.  Postop changes of the right hip, where an IM rod is noted with surrounding heterotopic ossification.  Scattered air and stool throughout the bowel without definite obstruction.  No significant dilatation or ileus.  Left pelvic calcification compatible with venous phlebolith.  IMPRESSION: Nonobstructive bowel gas pattern.  Original Report Authenticated By: Judie Petit. Ruel Favors, M.D.   Dg Abd Acute W/chest  10/10/2011  *RADIOLOGY REPORT*  Clinical Data: Medical clearance.  No bowel movement for 10 days.  ACUTE ABDOMEN SERIES (ABDOMEN 2 VIEW & CHEST 1 VIEW)  Comparison: Chest and two views abdomen 10/14/2008.  Findings: Single view of the chest again demonstrates a hiatal hernia.  Lungs are clear.  No pneumothorax or pleural fluid.  Two views of the abdomen show no evidence of bowel obstruction.  No free intraperitoneal air is identified.  Convex left scoliosis is noted.  IM nail in the right femur is again seen.  IMPRESSION: No acute finding chest or abdomen.  Hiatal hernia.  Original Report Authenticated By: Bernadene Bell. Maricela Curet, M.D.     Date: 10/10/2011  Rate: 86  Rhythm:  normal sinus rhythm  QRS Axis: normal  Intervals: normal  ST/T Wave abnormalities: normal  Conduction Disutrbances:none  Narrative Interpretation:   Old EKG Reviewed: unchanged from 05/30/2011     Dg Esophagus  10/10/2011  *RADIOLOGY REPORT*  Clinical Data:  Trouble swallowing.  Dysphagia  UPPER GI SERIES WITHOUT KUB  Technique:  Routine upper GI series was performed with thin barium.  Fluoroscopy Time: 1.1 minutes  Comparison:  Chest x-ray 05/30/2011  Findings: Large hiatal hernia is present with approximately one half of the stomach in the chest.  Esophageal motility is diminished with dilatation of the esophagus. No stricture or mass was identified.  The patient refused to swallow a barium tablet.  The stomach emptied into the duodenum.  The stomach is not dilated.  IMPRESSION: Large hiatal hernia  Decreased esophageal motility.  Original Report Authenticated By: Camelia Phenes, M.D.   Dg Abd Acute W/chest  10/10/2011  *RADIOLOGY REPORT*  Clinical Data: Medical clearance.  No bowel movement for 10 days.  ACUTE ABDOMEN SERIES (ABDOMEN 2 VIEW & CHEST 1 VIEW)  Comparison: Chest and two views abdomen 10/14/2008.  Findings: Single view of the chest again demonstrates a hiatal hernia.  Lungs are clear.  No pneumothorax or pleural fluid.  Two views of the abdomen show no evidence of bowel obstruction.  No free intraperitoneal air is identified.  Convex left scoliosis is noted.  IM nail in the right femur is again seen.  IMPRESSION: No acute finding chest or abdomen.  Hiatal hernia.  Original Report Authenticated By: Bernadene Bell. Maricela Curet, M.D.  1. Constipation   2. Urinary tract infection   3. Depression   4. Dysphagia     Disposition pending telepsych evaluation   Devoria Albe, MD, FACEP   MDM           Ward Givens, MD 10/10/11 2039

## 2011-10-10 NOTE — ED Notes (Signed)
Pt states that she has thoughts of suicide. Pt plans to use a clothes hanger to cut her throat. States that she has been feeling bad for the past four days. Pt has previous attempt at suicide four years ago. Pt states has not urinated or had BM in 10 days.

## 2011-10-11 ENCOUNTER — Emergency Department (HOSPITAL_COMMUNITY): Payer: Medicare HMO

## 2011-10-11 LAB — CBC WITH DIFFERENTIAL/PLATELET
Basophils Relative: 0 % (ref 0–1)
HCT: 39.6 % (ref 36.0–46.0)
Hemoglobin: 12.3 g/dL (ref 12.0–15.0)
Lymphs Abs: 1.8 10*3/uL (ref 0.7–4.0)
MCHC: 31.1 g/dL (ref 30.0–36.0)
Monocytes Absolute: 0.3 10*3/uL (ref 0.1–1.0)
Monocytes Relative: 7 % (ref 3–12)
Neutro Abs: 2.6 10*3/uL (ref 1.7–7.7)
Neutrophils Relative %: 54 % (ref 43–77)
RBC: 5.03 MIL/uL (ref 3.87–5.11)

## 2011-10-11 LAB — URINE CULTURE: Special Requests: NORMAL

## 2011-10-11 NOTE — ED Notes (Signed)
Pt. returned from XR. 

## 2011-10-11 NOTE — BH Assessment (Signed)
Assessment Note   Shannon Hurst is an 71 y.o. female.   Patient repots feelings of depression that has led to have thoughts of wanting to hurt herself. Patient reports a history of mental illness that consists of outpatient therapy, medication management and case management.  Patient states she was admitted at [facility] about 6 months ago for depression. Patient reports that she does not feel as if she can contract for safety.  Patient denies any psychosis. Patient denies any HI.  Patient denies any substance abuse history.  Patient receives a Tele-Psych that recommending inpatient admission.    Axis I: Depressive Disorder NOS Axis II: Deferred Axis III:  Past Medical History  Diagnosis Date  . GERD (gastroesophageal reflux disease)   . Mood disorder   . Depression    Axis IV: other psychosocial or environmental problems, problems related to social environment, problems with access to health care services and problems with primary support group Axis V: 41-50 serious symptoms  Past Medical History:  Past Medical History  Diagnosis Date  . GERD (gastroesophageal reflux disease)   . Mood disorder   . Depression     Past Surgical History  Procedure Date  . Hip surgery   . Arm surgery    . Abdominal hysterectomy     Family History: No family history on file.  Social History:  reports that she has never smoked. She does not have any smokeless tobacco history on file. She reports that she does not drink alcohol or use illicit drugs.  Additional Social History:     CIWA: CIWA-Ar BP: 119/75 mmHg Pulse Rate: 77  COWS:    Allergies:  Allergies  Allergen Reactions  . Dalmane (Flurazepam Hcl) Rash  . Penicillins Rash  . Sulfa Antibiotics Rash    Home Medications:  (Not in a hospital admission)  OB/GYN Status:  No LMP recorded. Patient has had a hysterectomy.        Risk to self Is patient at risk for suicide?: Yes Substance abuse history and/or  treatment for substance abuse?: No        Mental Status Report Motor Activity: Unremarkable                    Home Assistive Devices/Equipment Home Assistive Devices/Equipment: Walker (specify type)      Values / Beliefs Cultural Requests During Hospitalization: None Spiritual Requests During Hospitalization: None     Nutrition Screen Diet: Regular        Disposition: Pending Placement at [facility] and [place].     On Site Evaluation by:   Reviewed with Physician:     Phillip Heal LaVerne 10/11/2011 9:27 AM

## 2011-10-11 NOTE — ED Notes (Signed)
Pt stating she is ready to go over to [facility], asking when she is going over to [facility].

## 2011-10-12 ENCOUNTER — Telehealth (HOSPITAL_COMMUNITY): Payer: Self-pay | Admitting: Licensed Clinical Social Worker

## 2011-10-12 MED ORDER — ENSURE COMPLETE PO LIQD
237.0000 mL | Freq: Three times a day (TID) | ORAL | Status: DC
  Administered 2011-10-12 – 2011-10-13 (×5): 237 mL via ORAL
  Filled 2011-10-12 (×9): qty 237

## 2011-10-12 NOTE — ED Notes (Signed)
Pt states "I haven't had a BM in 10 days, they always give me an enema, I go to [facility] and they give them to me in a room, my stomach hurts, it always hurts up high"

## 2011-10-12 NOTE — ED Provider Notes (Signed)
Patient states some improved but still feels depressed and suicidal.  Patient awaiting placement with pending acceptance at [facility].    Hilario Quarry, MD 10/12/11 1001
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
## 2011-10-12 NOTE — Progress Notes (Signed)
Pt is currently pending [place], [facility] and [place]. [facility]. Oncoming CSW/ACT to follow up regarding placement.

## 2011-10-13 DIAGNOSIS — R45851 Suicidal ideations: Secondary | ICD-10-CM

## 2011-10-13 DIAGNOSIS — F329 Major depressive disorder, single episode, unspecified: Secondary | ICD-10-CM

## 2011-10-13 DIAGNOSIS — F3289 Other specified depressive episodes: Secondary | ICD-10-CM

## 2011-10-13 MED ORDER — CIPROFLOXACIN HCL 500 MG PO TABS: 500.0000 mg | ORAL_TABLET | Freq: Two times a day (BID) | ORAL | Status: AC

## 2011-10-13 NOTE — Progress Notes (Signed)
Confirmed with [facility] pcp is Shannon Hurst EPIC updated

## 2011-10-13 NOTE — ED Notes (Signed)
Pt was escorted by [employer].  Pt was given bus voucher.  Pt assessment has not changed from am.

## 2011-10-13 NOTE — Consult Note (Signed)
Reason for Consult: depression and suicidal thoughts Referring Physician: Dr. Marlinda Mike Kynedi Profitt is an 71 y.o. female.  HPI: Patient was seen and chart reviewed. Patient presented with the symptoms of for depression and suicidal ideation with the plan of hurting herself on arrival. Patient was offered inpatient psychiatric treatment. Psychiatric consultation was requested again because. Patient stated Patient stated that she has been feeling better not suicidal anymore, she does not want to go into the hospital. Patient has been willing to participate in outpatient psychiatric services and also willing to receive treatment [facility]. Patient is more worried about losing her place at the [facility] where she has been staying about one year if  she cannot pay her bills. Patient reported she will contract for safety and will seek treatment if needed or her symptoms get worse  patient denied symptoms of mania, psychosis, suicidal and homicidal ideation.  Past Medical History  Diagnosis Date  . GERD (gastroesophageal reflux disease)   . Mood disorder   . Depression     Past Surgical History  Procedure Date  . Hip surgery   . Arm surgery    . Abdominal hysterectomy     No family history on file.  Social History:  reports that she has never smoked. She does not have any smokeless tobacco history on file. She reports that she does not drink alcohol or use illicit drugs.  Allergies:  Allergies  Allergen Reactions  . Dalmane (Flurazepam Hcl) Rash  . Penicillins Rash  . Sulfa Antibiotics Rash    Medications: I have reviewed the patient's current medications.  Results for orders placed during the hospital encounter of 10/10/11 (from the past 48 hour(s))  CBC WITH DIFFERENTIAL     Status: Abnormal   Collection Time   10/11/11 12:30 PM      Component Value Range Comment   WBC 4.8  4.0 - 10.5 K/uL    RBC 5.03  3.87 - 5.11 MIL/uL    Hemoglobin  12.3  12.0 - 15.0 g/dL    HCT 98.1  19.1 - 47.8 %    MCV 78.7  78.0 - 100.0 fL    MCH 24.5 (*) 26.0 - 34.0 pg    MCHC 31.1  30.0 - 36.0 g/dL    RDW 29.5  62.1 - 30.8 %    Platelets 248  150 - 400 K/uL    Neutrophils Relative 54  43 - 77 %    Neutro Abs 2.6  1.7 - 7.7 K/uL    Lymphocytes Relative 38  12 - 46 %    Lymphs Abs 1.8  0.7 - 4.0 K/uL    Monocytes Relative 7  3 - 12 %    Monocytes Absolute 0.3  0.1 - 1.0 K/uL    Eosinophils Relative 1  0 - 5 %    Eosinophils Absolute 0.1  0.0 - 0.7 K/uL    Basophils Relative 0  0 - 1 %    Basophils Absolute 0.0  0.0 - 0.1 K/uL     Dg Chest 2 View  10/11/2011  *RADIOLOGY REPORT*  Clinical Data: Medical clearance, constipation, urinary retention  CHEST - 2 VIEW  Comparison: 05/19/2011  Findings: Borderline enlargement of cardiac silhouette. Moderate to large hiatal hernia. Atherosclerotic calcification of the tortuous thoracic aorta. Minimal atelectasis left base. Lungs otherwise clear. No pleural effusion or pneumothorax. Bones appear diffusely demineralized with scattered degenerative changes and scoliosis spine. Retained contrast in  colon.  IMPRESSION: Moderate to large hiatal hernia. Minimal left base atelectasis.  Original Report Authenticated By: Lollie Marrow, M.D.    No anxiety, No psychosis and Positive for anxiety, bad mood and depression Blood pressure 90/58, pulse 87, temperature 98 F (36.7 C), temperature source Oral, resp. rate 16, SpO2 95.00%.   Assessment/Plan: Mood disorder NOS  Recommended and outpatient psychiatric services. Patient does not meet criteria for acute psychiatric hospitalization at this time. Patient may return if depressive symptoms get worse or suicidal ideations come back.  Jeri Jeanbaptiste,JANARDHAHA R. 10/13/2011, 12:20 PM
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
## 2011-12-11 ENCOUNTER — Encounter (HOSPITAL_COMMUNITY): Payer: Self-pay

## 2011-12-11 ENCOUNTER — Emergency Department (HOSPITAL_COMMUNITY)
Admission: EM | Admit: 2011-12-11 | Discharge: 2011-12-11 | Disposition: A | Discharge status: Home / Self Care | Payer: Medicare Other | Attending: Emergency Medicine | Admitting: Emergency Medicine

## 2011-12-11 DIAGNOSIS — N390 Urinary tract infection, site not specified: Secondary | ICD-10-CM | POA: Insufficient documentation

## 2011-12-11 DIAGNOSIS — K602 Anal fissure, unspecified: Secondary | ICD-10-CM

## 2011-12-11 DIAGNOSIS — K219 Gastro-esophageal reflux disease without esophagitis: Secondary | ICD-10-CM | POA: Insufficient documentation

## 2011-12-11 HISTORY — DX: Dysphagia, unspecified: R13.10

## 2011-12-11 LAB — CBC
HCT: 43.4 % (ref 36.0–46.0)
Hemoglobin: 13.5 g/dL (ref 12.0–15.0)
MCH: 24.5 pg — ABNORMAL LOW (ref 26.0–34.0)
MCHC: 31.1 g/dL (ref 30.0–36.0)
MCV: 78.9 fL (ref 78.0–100.0)
RBC: 5.5 MIL/uL — ABNORMAL HIGH (ref 3.87–5.11)

## 2011-12-11 LAB — COMPREHENSIVE METABOLIC PANEL
ALT: 12 U/L (ref 0–35)
BUN: 8 mg/dL (ref 6–23)
CO2: 29 mEq/L (ref 19–32)
Calcium: 10.3 mg/dL (ref 8.4–10.5)
Creatinine, Ser: 0.76 mg/dL (ref 0.50–1.10)
GFR calc Af Amer: >90 mL/min (ref >90)
GFR calc non Af Amer: 83 mL/min — ABNORMAL LOW (ref >90)
Glucose, Bld: 102 mg/dL — ABNORMAL HIGH (ref 70–99)
Sodium: 141 mEq/L (ref 135–145)
Total Protein: 7.4 g/dL (ref 6.0–8.3)

## 2011-12-11 LAB — URINALYSIS, ROUTINE W REFLEX MICROSCOPIC
Bilirubin Urine: NEGATIVE
Nitrite: POSITIVE — AB
Specific Gravity, Urine: 1.021 (ref 1.005–1.030)
Urobilinogen, UA: 1 mg/dL (ref 0.0–1.0)
pH: 6.5 (ref 5.0–8.0)

## 2011-12-11 LAB — URINE MICROSCOPIC-ADD ON

## 2011-12-11 MED ORDER — CEPHALEXIN 125 MG/5ML PO SUSR
500.0000 mg | Freq: Once | ORAL | Status: DC
  Filled 2011-12-11: qty 20

## 2011-12-11 MED ORDER — CEPHALEXIN 250 MG/5ML PO SUSR: 500.0000 mg | Freq: Two times a day (BID) | ORAL | Status: AC

## 2011-12-11 MED ORDER — CEPHALEXIN 500 MG PO CAPS: 500.0000 mg | ORAL_CAPSULE | Freq: Two times a day (BID) | ORAL | Status: DC

## 2011-12-11 MED ORDER — CEPHALEXIN 250 MG/5ML PO SUSR
500.0000 mg | Freq: Once | ORAL | Status: DC
  Filled 2011-12-11: qty 10

## 2011-12-11 MED ORDER — CEPHALEXIN 500 MG PO CAPS
500.0000 mg | ORAL_CAPSULE | Freq: Once | ORAL | Status: DC
  Filled 2011-12-11: qty 1

## 2011-12-11 NOTE — ED Notes (Signed)
[facility] called  

## 2011-12-11 NOTE — ED Provider Notes (Signed)
History     CSN: [id number]  Arrival date & time 12/11/11  1310   First MD Initiated Contact with Patient 12/11/11 1635      Chief Complaint  Patient presents with  . Rectal Bleeding    (Consider location/radiation/quality/duration/timing/severity/associated sxs/prior treatment) HPI The patient presents with concerns of decreased urine output and new rectal bleeding.  She has been told approximately one week ago she was in her usual state of health.  About that time she developed intermittent rectal bleeding.  She notes that bowel movements are painful, with production of blood accompanying stool.  She denies concurrent abdominal pain, lightheadedness, syncope, chest pain, dyspnea. She notes that she has also had increasing debility initiating a urinary stream, and decreased urine output. She denies fevers, chills, nausea, vomiting, diarrhea. No clear alleviating or exacerbating factors Past Medical History  Diagnosis Date  . GERD (gastroesophageal reflux disease)   . Mood disorder   . Depression   . Difficulty swallowing     Past Surgical History  Procedure Date  . Hip surgery   . Arm surgery    . Abdominal hysterectomy     No family history on file.  History  Substance Use Topics  . Smoking status: Never Smoker   . Smokeless tobacco: Not on file  . Alcohol Use: No    OB History    Grav Para Term Preterm Abortions TAB SAB Ect Mult Living                  Review of Systems  Constitutional:       HPI  HENT:       HPI otherwise negative  Eyes: Negative.   Respiratory:       HPI, otherwise negative  Cardiovascular:       HPI, otherwise nmegative  Gastrointestinal: Negative for vomiting.  Genitourinary:       HPI, otherwise negative  Musculoskeletal:       HPI, otherwise negative  Skin: Negative.   Neurological: Negative for syncope.    Allergies  Dalmane; Penicillins; and Sulfa antibiotics  Home Medications   Current Outpatient Rx  Name Route  Sig Dispense Refill  . ACETAMINOPHEN 160 MG/5ML PO SUSP Oral Take 480 mg by mouth 2 (two) times daily as needed. For pain.    Marland Kitchen ENSURE PLUS PO LIQD Oral Take 237 mLs by mouth 3 (three) times daily between meals.    . CEPHALEXIN 500 MG PO CAPS Oral Take 1 capsule (500 mg total) by mouth 2 (two) times daily. 20 capsule 0    BP 156/66  Pulse 84  Temp 97.5 F (36.4 C) (Oral)  Resp 18  SpO2 98%  Physical Exam  Nursing note and vitals reviewed. Constitutional: She is oriented to person, place, and time. She appears well-developed and well-nourished. No distress.  HENT:  Head: Normocephalic and atraumatic.  Eyes: Conjunctivae normal and EOM are normal.  Cardiovascular: Normal rate and regular rhythm.   Pulmonary/Chest: Effort normal and breath sounds normal. No stridor. No respiratory distress.  Abdominal: She exhibits no distension.  Genitourinary: Rectal exam shows fissure.       Anal fissure at 12:00 position  Musculoskeletal: She exhibits no edema.  Neurological: She is alert and oriented to person, place, and time. No cranial nerve deficit.  Skin: Skin is warm and dry.  Psychiatric: She has a normal mood and affect.    ED Course  Procedures (including critical care time)  Labs Reviewed  CBC - Abnormal; Notable  for the following:    RBC 5.50 (*)     MCH 24.5 (*)     All other components within normal limits  COMPREHENSIVE METABOLIC PANEL - Abnormal; Notable for the following:    Glucose, Bld 102 (*)     GFR calc non Af Amer 83 (*)     All other components within normal limits  URINALYSIS, ROUTINE W REFLEX MICROSCOPIC - Abnormal; Notable for the following:    APPearance CLOUDY (*)     Nitrite POSITIVE (*)     Leukocytes, UA SMALL (*)     All other components within normal limits  URINE MICROSCOPIC-ADD ON - Abnormal; Notable for the following:    Bacteria, UA FEW (*)     All other components within normal limits   No results found.   1. Rectal fissure   2. UTI (lower  urinary tract infection)       MDM  This elderly female presents with concerns of rectal bleeding and decreased urinary frequency.  On exam she is in no distress.  Patient also are unremarkable.  She has an anal fissure.  The patient's abdomen is soft, nontender.  There is also evidence of a urinary tract infection.  The patient is appropriate for discharge with continued outpatient management.    Gerhard Munch, MD 12/11/11 1820

## 2011-12-11 NOTE — ED Notes (Signed)
Pt c/o possible rectal bleeding on and off x 10 days, describe it as dark and red substances that leaks out of rectum but no stool. LBM was last Friday.  Pain on lower abdomen, vital sign stable.

## 2011-12-20 ENCOUNTER — Encounter (HOSPITAL_COMMUNITY): Payer: Self-pay | Admitting: Family Medicine

## 2011-12-20 ENCOUNTER — Emergency Department (HOSPITAL_COMMUNITY)
Admission: EM | Admit: 2011-12-20 | Discharge: 2011-12-20 | Disposition: A | Discharge status: Home / Self Care | Payer: Medicare Other | Attending: Emergency Medicine | Admitting: Emergency Medicine

## 2011-12-20 DIAGNOSIS — Z888 Allergy status to other drugs, medicaments and biological substances status: Secondary | ICD-10-CM | POA: Insufficient documentation

## 2011-12-20 DIAGNOSIS — Z23 Encounter for immunization: Secondary | ICD-10-CM | POA: Insufficient documentation

## 2011-12-20 DIAGNOSIS — S01111A Laceration without foreign body of right eyelid and periocular area, initial encounter: Secondary | ICD-10-CM

## 2011-12-20 DIAGNOSIS — S0180XA Unspecified open wound of other part of head, initial encounter: Secondary | ICD-10-CM | POA: Insufficient documentation

## 2011-12-20 DIAGNOSIS — R339 Retention of urine, unspecified: Secondary | ICD-10-CM | POA: Insufficient documentation

## 2011-12-20 DIAGNOSIS — W1809XA Striking against other object with subsequent fall, initial encounter: Secondary | ICD-10-CM | POA: Insufficient documentation

## 2011-12-20 DIAGNOSIS — Z882 Allergy status to sulfonamides status: Secondary | ICD-10-CM | POA: Insufficient documentation

## 2011-12-20 DIAGNOSIS — Z880 Allergy status to penicillin: Secondary | ICD-10-CM | POA: Insufficient documentation

## 2011-12-20 HISTORY — DX: Essential (primary) hypertension: I10

## 2011-12-20 LAB — URINALYSIS, ROUTINE W REFLEX MICROSCOPIC
Bilirubin Urine: NEGATIVE
Hgb urine dipstick: NEGATIVE
Protein, ur: NEGATIVE mg/dL
Urobilinogen, UA: 0.2 mg/dL (ref 0.0–1.0)

## 2011-12-20 LAB — CBC WITH DIFFERENTIAL/PLATELET
Basophils Absolute: 0 10*3/uL (ref 0.0–0.1)
Basophils Relative: 0 % (ref 0–1)
Eosinophils Absolute: 0.1 10*3/uL (ref 0.0–0.7)
HCT: 38.9 % (ref 36.0–46.0)
Hemoglobin: 12.2 g/dL (ref 12.0–15.0)
MCH: 24.7 pg — ABNORMAL LOW (ref 26.0–34.0)
MCHC: 31.4 g/dL (ref 30.0–36.0)
Monocytes Absolute: 0.7 10*3/uL (ref 0.1–1.0)
Monocytes Relative: 8 % (ref 3–12)
Neutrophils Relative %: 60 % (ref 43–77)
RDW: 14.5 % (ref 11.5–15.5)

## 2011-12-20 LAB — POCT I-STAT, CHEM 8
Chloride: 105 mEq/L (ref 96–112)
Creatinine, Ser: 1 mg/dL (ref 0.50–1.10)
Glucose, Bld: 75 mg/dL (ref 70–99)
HCT: 39 % (ref 36.0–46.0)
Potassium: 3.7 mEq/L (ref 3.5–5.1)

## 2011-12-20 MED ORDER — TETANUS-DIPHTH-ACELL PERTUSSIS 5-2.5-18.5 LF-MCG/0.5 IM SUSP
0.5000 mL | Freq: Once | INTRAMUSCULAR | Status: AC
  Administered 2011-12-20: 0.5 mL via INTRAMUSCULAR
  Filled 2011-12-20: qty 0.5

## 2011-12-20 NOTE — ED Notes (Signed)
Per pt she had a fall this am and hit head on kitchen counter. Pt has small lac to right eyebrow. sts also hasn't urinated in 5 days.

## 2011-12-20 NOTE — ED Notes (Signed)
Pt asked the ED new psych ward, pt was informed we do not have a floor for psych pts but have a few rooms designated for those patients. Pt reports she has been feeling depressed lately and is having SI, pt reports she has a plan to use a clothes hanger to kill herself. Pt reports that she is no longer going to do that though. Pt reports she wishes she went to the [facility].

## 2011-12-20 NOTE — ED Notes (Signed)
Dr. Ignacia Palma at bedside to suture pt's eyebrow.

## 2011-12-20 NOTE — ED Notes (Signed)
Dr. Ignacia Palma at bedside to talk with and assess patient.

## 2011-12-20 NOTE — ED Provider Notes (Cosign Needed)
History     CSN: [id number]  Arrival date & time 12/20/11  1253   First MD Initiated Contact with Patient 12/20/11 1405      Chief Complaint  Patient presents with  . Fall  . Urinary Retention    (Consider location/radiation/quality/duration/timing/severity/associated sxs/prior treatment) HPI Comments: The patient is a 71 year old woman who was using her walker, apparently her right leg gave way and she fell, striking her forehead on the table. This created a laceration on her right eyebrow. Also she says she's been unable to urinate for the past 10 days. She been seen previously on September 23, and was diagnosed with UTI at that time. She is a former [facility] patient. Apparently no one is currently treating her. In addition, she says her nerves are bad.  Patient is a 71 y.o. female presenting with fall. The history is provided by the patient and medical records. No language interpreter was used.  Fall The accident occurred 3 to 5 hours ago. The fall occurred while walking. She fell from a height of 1 to 2 ft. Impact surface: Fell from standing, hitting her forehead on a table. The volume of blood lost was minimal. Point of impact: Forehead. The pain is at a severity of 10/10. The pain is severe. She was ambulatory at the scene. There was no entrapment after the fall. There was no drug use involved in the accident. There was no alcohol use involved in the accident. Pertinent negatives include no fever. Associated symptoms comments: She says that she has not urinated in ten days.. Exacerbated by: Nothing. She has tried nothing for the symptoms.    Past Medical History  Diagnosis Date  . GERD (gastroesophageal reflux disease)   . Mood disorder   . Depression   . Difficulty swallowing   . Hypertension     Past Surgical History  Procedure Date  . Hip surgery   . Arm surgery    . Abdominal hysterectomy     History reviewed. No pertinent family history.  History  Substance Use  Topics  . Smoking status: Never Smoker   . Smokeless tobacco: Not on file  . Alcohol Use: No    OB History    Grav Para Term Preterm Abortions TAB SAB Ect Mult Living                  Review of Systems  Constitutional: Negative.  Negative for fever and chills.  HENT:       Forehead laceration   Eyes: Negative.   Respiratory: Negative.   Cardiovascular: Negative.   Gastrointestinal: Negative.   Genitourinary: Positive for difficulty urinating.  Musculoskeletal: Negative.   Skin: Negative.   Neurological: Negative.   Psychiatric/Behavioral: The patient is nervous/anxious.     Allergies  Dalmane; Penicillins; and Sulfa antibiotics  Home Medications   Current Outpatient Rx  Name Route Sig Dispense Refill  . ACETAMINOPHEN 160 MG/5ML PO SUSP Oral Take 480 mg by mouth 2 (two) times daily as needed. For pain.    Marland Kitchen ENSURE PLUS PO LIQD Oral Take 237 mLs by mouth 3 (three) times daily between meals.      BP 129/77  Pulse 91  Temp 97 F (36.1 C)  Resp 18  SpO2 100%  Physical Exam  Nursing note and vitals reviewed. Constitutional: She is oriented to person, place, and time.       Elderly woman with laceration of right eyebrow, awake and alert.  HENT:  Head: Normocephalic.  Right  Ear: External ear normal.  Left Ear: External ear normal.  Mouth/Throat: Oropharynx is clear and moist.       V shaped 2 cm laceration of right eyebrow.  No intrinsic eye injury.  Eyes: Conjunctivae normal and EOM are normal. Pupils are equal, round, and reactive to light. No scleral icterus.  Neck: Normal range of motion. Neck supple.  Cardiovascular: Normal rate, regular rhythm and normal heart sounds.   Pulmonary/Chest: Effort normal and breath sounds normal.  Abdominal: Soft. Bowel sounds are normal. She exhibits no distension. There is no tenderness. There is no rebound.  Musculoskeletal: Normal range of motion. She exhibits no edema and no tenderness.  Neurological: She is alert and  oriented to person, place, and time.       No sensory or motor deficit.   Skin: Skin is warm and dry.  Psychiatric:       Strange affect.      ED Course  LACERATION REPAIR Date/Time: 12/20/2011 7:30 PM Performed by: Osvaldo Human Authorized by: Osvaldo Human Consent: Verbal consent obtained. Risks and benefits: risks, benefits and alternatives were discussed Consent given by: patient Patient understanding: patient states understanding of the procedure being performed Patient consent: the patient's understanding of the procedure matches consent given Site marked: the operative site was not marked Patient identity confirmed: verbally with patient Time out: Immediately prior to procedure a "time out" was called to verify the correct patient, procedure, equipment, support staff and site/side marked as required. Body area: head/neck Location details: right eyebrow Laceration length: 2 cm Foreign bodies: no foreign bodies Tendon involvement: none Nerve involvement: none Vascular damage: no Anesthesia: local infiltration Local anesthetic: lidocaine 2% without epinephrine Patient sedated: no Preparation: Patient was prepped and draped in the usual sterile fashion. Irrigation solution: saline Amount of cleaning: standard Skin closure: 6-0 nylon Number of sutures: 4 Technique: simple Approximation: loose Approximation difficulty: simple Dressing: Wound left exposed. Patient tolerance: Patient tolerated the procedure well with no immediate complications.   (including critical care time)   Labs Reviewed  CBC WITH DIFFERENTIAL  URINALYSIS, ROUTINE W REFLEX MICROSCOPIC  URINE CULTURE   3:26 PM Pt seen --> physical exam performed.  Old charts reviewed.  Had recent visit for UTI 12/11/2011.  Lab workup and cath urine for UA, C&S, residual urine ordered.  Suture cart ordered.  5:04 PM Results for orders placed during the hospital encounter of 12/20/11  CBC WITH  DIFFERENTIAL      Component Value Range   WBC 8.6  4.0 - 10.5 K/uL   RBC 4.94  3.87 - 5.11 MIL/uL   Hemoglobin 12.2  12.0 - 15.0 g/dL   HCT 16.1  09.6 - 04.5 %   MCV 78.7  78.0 - 100.0 fL   MCH 24.7 (*) 26.0 - 34.0 pg   MCHC 31.4  30.0 - 36.0 g/dL   RDW 40.9  81.1 - 91.4 %   Platelets 258  150 - 400 K/uL   Neutrophils Relative 60  43 - 77 %   Neutro Abs 5.1  1.7 - 7.7 K/uL   Lymphocytes Relative 31  12 - 46 %   Lymphs Abs 2.7  0.7 - 4.0 K/uL   Monocytes Relative 8  3 - 12 %   Monocytes Absolute 0.7  0.1 - 1.0 K/uL   Eosinophils Relative 1  0 - 5 %   Eosinophils Absolute 0.1  0.0 - 0.7 K/uL   Basophils Relative 0  0 - 1 %  Basophils Absolute 0.0  0.0 - 0.1 K/uL  URINALYSIS, ROUTINE W REFLEX MICROSCOPIC      Component Value Range   Color, Urine YELLOW  YELLOW   APPearance CLEAR  CLEAR   Specific Gravity, Urine 1.003 (*) 1.005 - 1.030   pH 6.5  5.0 - 8.0   Glucose, UA NEGATIVE  NEGATIVE mg/dL   Hgb urine dipstick NEGATIVE  NEGATIVE   Bilirubin Urine NEGATIVE  NEGATIVE   Ketones, ur NEGATIVE  NEGATIVE mg/dL   Protein, ur NEGATIVE  NEGATIVE mg/dL   Urobilinogen, UA 0.2  0.0 - 1.0 mg/dL   Nitrite NEGATIVE  NEGATIVE   Leukocytes, UA NEGATIVE  NEGATIVE  POCT I-STAT, CHEM 8      Component Value Range   Sodium 142  135 - 145 mEq/L   Potassium 3.7  3.5 - 5.1 mEq/L   Chloride 105  96 - 112 mEq/L   BUN 8  6 - 23 mg/dL   Creatinine, Ser 9.52  0.50 - 1.10 mg/dL   Glucose, Bld 75  70 - 99 mg/dL   Calcium, Ion 8.41  3.24 - 1.30 mmol/L   TCO2 25  0 - 100 mmol/L   Hemoglobin 13.3  12.0 - 15.0 g/dL   HCT 40.1  02.7 - 25.3 %   Lab workup is negative.  5:40 PM Pt eating supper.  Will repair her laceration when she has finished.  7:32 PM [name] laceration was repaired.  She had normal lab work.  Return in 4 days for suture removal.  1. Laceration of right eyebrow          Carleene Cooper III, MD 12/20/11 [phone]

## 2011-12-20 NOTE — ED Notes (Signed)
Pt reports she she was walking with her walker when "it all went blank". Pt reports she fell and hit her head, pt has small laceration on right eyebrow, approx 0.5 inch in length. Pt A&Ox4. Pt reports her nerves have been acting up and that she hasn't been peeing much lately. Pt reports she would like to see a psychiatrist for her nerves. Pt reports she has also noticed some rectal bleeding, pt has hx of hemorrhoids and thinks she is currently having them. Pt reports LBM 10 days ago.

## 2011-12-21 LAB — URINE CULTURE: Culture: NO GROWTH

## 2012-01-02 ENCOUNTER — Encounter (HOSPITAL_COMMUNITY): Payer: Self-pay | Admitting: Emergency Medicine

## 2012-01-02 ENCOUNTER — Emergency Department (HOSPITAL_COMMUNITY)
Admission: EM | Admit: 2012-01-02 | Discharge: 2012-01-02 | Disposition: A | Discharge status: Home / Self Care | Payer: Medicare Other | Attending: Emergency Medicine | Admitting: Emergency Medicine

## 2012-01-02 DIAGNOSIS — K219 Gastro-esophageal reflux disease without esophagitis: Secondary | ICD-10-CM | POA: Insufficient documentation

## 2012-01-02 DIAGNOSIS — I10 Essential (primary) hypertension: Secondary | ICD-10-CM | POA: Insufficient documentation

## 2012-01-02 DIAGNOSIS — Z4802 Encounter for removal of sutures: Secondary | ICD-10-CM | POA: Insufficient documentation

## 2012-01-02 MED ORDER — ACETAMINOPHEN 160 MG/5ML PO SOLN
650.0000 mg | Freq: Once | ORAL | Status: AC
  Administered 2012-01-02: 650 mg via ORAL

## 2012-01-02 MED ORDER — ACETAMINOPHEN 160 MG/5ML PO SOLN
325.0000 mg | Freq: Once | ORAL | Status: DC
  Filled 2012-01-02: qty 40.6

## 2012-01-02 NOTE — ED Notes (Signed)
Pt sts she is not eating well because she has trouble swallowing; pt requesting to go to cafeteria and has menu; pt seen here multiple times in past for same

## 2012-01-02 NOTE — ED Provider Notes (Signed)
History     CSN: [id number]  Arrival date & time 01/02/12  1323   First MD Initiated Contact with Patient 01/02/12 1613      No chief complaint on file.  HPI  71 year old female presents for suture. Denies any fevers, drainage, or redness around the wound repair. She denies any difficulty swallowing or eating but states that she does have a large hiatal hernia. This was recently noted to on her upper GI series with barium on 10/10/2011 which demonstrated a large hiatal hernia with approximately half of the stomach in the chest. Esophageal motility was noted to be diminished dilation of the esophagus. No stricture or masses identified.  She states that there has been no change from her baseline with her "trouble swallowing".         Past Medical History  Diagnosis Date  . GERD (gastroesophageal reflux disease)   . Mood disorder   . Depression   . Difficulty swallowing   . Hypertension     Past Surgical History  Procedure Date  . Hip surgery   . Arm surgery    . Abdominal hysterectomy     History reviewed. No pertinent family history.  History  Substance Use Topics  . Smoking status: Never Smoker   . Smokeless tobacco: Not on file  . Alcohol Use: No    OB History    Grav Para Term Preterm Abortions TAB SAB Ect Mult Living                  Review of Systems  Constitutional: Negative for fever, chills, activity change and appetite change.  HENT: Negative for ear pain, congestion, rhinorrhea and neck pain.   Eyes: Negative for pain.  Respiratory: Negative for cough and shortness of breath.   Cardiovascular: Negative for chest pain and palpitations.  Gastrointestinal: Negative for nausea, vomiting and abdominal pain.  Genitourinary: Negative for dysuria, difficulty urinating and pelvic pain.  Musculoskeletal: Negative for back pain.  Skin: Negative for rash and wound.  Neurological: Negative for weakness and headaches.  Psychiatric/Behavioral: Negative for  behavioral problems, confusion and agitation.    Allergies  Dalmane; Penicillins; and Sulfa antibiotics  Home Medications   Current Outpatient Rx  Name Route Sig Dispense Refill  . ACETAMINOPHEN 160 MG/5ML PO SUSP Oral Take 480 mg by mouth 2 (two) times daily as needed. For pain.    Marland Kitchen ENSURE PLUS PO LIQD Oral Take 237 mLs by mouth 3 (three) times daily between meals.      BP 149/68  Pulse 91  Temp 97.7 F (36.5 C) (Oral)  Resp 18  SpO2 99%  Physical Exam  Constitutional: She is oriented to person, place, and time. She appears well-developed and well-nourished. No distress.  HENT:  Head: Normocephalic and atraumatic.  Nose: Nose normal.  Mouth/Throat: Oropharynx is clear and moist.  Eyes: EOM are normal. Pupils are equal, round, and reactive to light.  Neck: Normal range of motion. Neck supple. No tracheal deviation present.  Cardiovascular: Normal rate, regular rhythm, normal heart sounds and intact distal pulses.   Pulmonary/Chest: Effort normal and breath sounds normal. She has no rales.  Abdominal: Soft. Bowel sounds are normal. She exhibits no distension. There is no tenderness. There is no rebound and no guarding.  Musculoskeletal: Normal range of motion. She exhibits no tenderness.  Neurological: She is alert and oriented to person, place, and time.  Skin: Skin is warm and dry. No rash noted.       Well-healed  wound over right eyebrow. No drainage or erythema  Psychiatric: She has a normal mood and affect. Her behavior is normal.    ED Course  Procedures      1. Visit for suture removal       MDM    71 year old female presents for suture removal. No evidence of infection. Well-healed incision with no erythema or drainage. 4 sutures removed. Patient tolerated procedure well. Followup as needed.        Nadara Mustard, MD 01/02/12 [phone]

## 2012-01-02 NOTE — ED Provider Notes (Signed)
I saw and evaluated the patient, reviewed the resident's note and I agree with the findings and plan.  Pt stable vitals and her swallwoing is at baseline--sutures removed, stabvle for d/c  Toy Baker, MD 01/02/12 1644

## 2012-01-02 NOTE — ED Notes (Signed)
Patient called x3 to go to a room. Unable to locate patient

## 2012-01-07 NOTE — ED Provider Notes (Signed)
I saw and evaluated the patient, reviewed the resident's note and I agree with the findings and plan.  Toy Baker, MD 01/07/12 1012

## 2012-04-03 ENCOUNTER — Emergency Department (HOSPITAL_COMMUNITY)
Admission: EM | Admit: 2012-04-03 | Discharge: 2012-04-03 | Disposition: A | Discharge status: Home / Self Care | Payer: Medicare Other | Attending: Emergency Medicine | Admitting: Emergency Medicine

## 2012-04-03 ENCOUNTER — Encounter (HOSPITAL_COMMUNITY): Payer: Self-pay | Admitting: Emergency Medicine

## 2012-04-03 DIAGNOSIS — I10 Essential (primary) hypertension: Secondary | ICD-10-CM | POA: Insufficient documentation

## 2012-04-03 DIAGNOSIS — Z90710 Acquired absence of both cervix and uterus: Secondary | ICD-10-CM | POA: Insufficient documentation

## 2012-04-03 DIAGNOSIS — Z8719 Personal history of other diseases of the digestive system: Secondary | ICD-10-CM | POA: Insufficient documentation

## 2012-04-03 DIAGNOSIS — R34 Anuria and oliguria: Secondary | ICD-10-CM | POA: Insufficient documentation

## 2012-04-03 DIAGNOSIS — Z8659 Personal history of other mental and behavioral disorders: Secondary | ICD-10-CM | POA: Insufficient documentation

## 2012-04-03 LAB — BASIC METABOLIC PANEL
CO2: 29 mEq/L (ref 19–32)
Chloride: 101 mEq/L (ref 96–112)
Glucose, Bld: 99 mg/dL (ref 70–99)
Potassium: 4.1 mEq/L (ref 3.5–5.1)
Sodium: 139 mEq/L (ref 135–145)

## 2012-04-03 LAB — URINALYSIS, MICROSCOPIC ONLY
Bilirubin Urine: NEGATIVE
Nitrite: NEGATIVE
Specific Gravity, Urine: 1.011 (ref 1.005–1.030)
pH: 5 (ref 5.0–8.0)

## 2012-04-03 LAB — CBC WITH DIFFERENTIAL/PLATELET
Eosinophils Relative: 1 % (ref 0–5)
Lymphocytes Relative: 30 % (ref 12–46)
Lymphs Abs: 1.9 10*3/uL (ref 0.7–4.0)
MCV: 79.3 fL (ref 78.0–100.0)
Neutro Abs: 4 10*3/uL (ref 1.7–7.7)
Neutrophils Relative %: 62 % (ref 43–77)
Platelets: 284 10*3/uL (ref 150–400)
RBC: 5.22 MIL/uL — ABNORMAL HIGH (ref 3.87–5.11)
WBC: 6.4 10*3/uL (ref 4.0–10.5)
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
## 2012-04-03 NOTE — ED Notes (Signed)
Pt states that her "kidneys aren't functioning right".  When asked why she thought this, she said "BECAUSE MY KIDNEYS AREN'T FUNCTIONING RIGHT, OK?".  Pt c/o dysuria, burning.

## 2013-08-22 ENCOUNTER — Emergency Department (HOSPITAL_COMMUNITY): Payer: Medicare Other

## 2013-08-22 ENCOUNTER — Encounter (HOSPITAL_COMMUNITY): Payer: Self-pay | Admitting: Emergency Medicine

## 2013-08-22 ENCOUNTER — Emergency Department (HOSPITAL_COMMUNITY)
Admission: EM | Admit: 2013-08-22 | Discharge: 2013-08-24 | Disposition: A | Discharge status: Other Acute Inpatient Hospital | Payer: Medicare Other | Attending: Emergency Medicine | Admitting: Emergency Medicine

## 2013-08-22 DIAGNOSIS — Y9389 Activity, other specified: Secondary | ICD-10-CM | POA: Diagnosis not present

## 2013-08-22 DIAGNOSIS — R209 Unspecified disturbances of skin sensation: Secondary | ICD-10-CM | POA: Diagnosis present

## 2013-08-22 DIAGNOSIS — K219 Gastro-esophageal reflux disease without esophagitis: Secondary | ICD-10-CM | POA: Insufficient documentation

## 2013-08-22 DIAGNOSIS — R296 Repeated falls: Secondary | ICD-10-CM | POA: Diagnosis not present

## 2013-08-22 DIAGNOSIS — I10 Essential (primary) hypertension: Secondary | ICD-10-CM | POA: Diagnosis not present

## 2013-08-22 DIAGNOSIS — Z79899 Other long term (current) drug therapy: Secondary | ICD-10-CM | POA: Insufficient documentation

## 2013-08-22 DIAGNOSIS — Z8659 Personal history of other mental and behavioral disorders: Secondary | ICD-10-CM | POA: Insufficient documentation

## 2013-08-22 DIAGNOSIS — S59919A Unspecified injury of unspecified forearm, initial encounter: Secondary | ICD-10-CM

## 2013-08-22 DIAGNOSIS — F339 Major depressive disorder, recurrent, unspecified: Secondary | ICD-10-CM | POA: Insufficient documentation

## 2013-08-22 DIAGNOSIS — R45851 Suicidal ideations: Secondary | ICD-10-CM | POA: Diagnosis not present

## 2013-08-22 DIAGNOSIS — Y929 Unspecified place or not applicable: Secondary | ICD-10-CM | POA: Diagnosis not present

## 2013-08-22 DIAGNOSIS — S6990XA Unspecified injury of unspecified wrist, hand and finger(s), initial encounter: Secondary | ICD-10-CM

## 2013-08-22 DIAGNOSIS — Z880 Allergy status to penicillin: Secondary | ICD-10-CM | POA: Diagnosis not present

## 2013-08-22 DIAGNOSIS — S59909A Unspecified injury of unspecified elbow, initial encounter: Secondary | ICD-10-CM | POA: Insufficient documentation

## 2013-08-22 HISTORY — DX: Personal history of suicidal behavior: Z91.51

## 2013-08-22 HISTORY — DX: Personal history of self-harm: Z91.5

## 2013-08-22 LAB — CBC WITH DIFFERENTIAL/PLATELET
BASOS ABS: 0 10*3/uL (ref 0.0–0.1)
BASOS PCT: 0 % (ref 0–1)
Eosinophils Absolute: 0.1 10*3/uL (ref 0.0–0.7)
Eosinophils Relative: 1 % (ref 0–5)
HCT: 42.9 % (ref 36.0–46.0)
Hemoglobin: 13.3 g/dL (ref 12.0–15.0)
Lymphocytes Relative: 36 % (ref 12–46)
Lymphs Abs: 2.9 10*3/uL (ref 0.7–4.0)
MCH: 24.5 pg — ABNORMAL LOW (ref 26.0–34.0)
MCHC: 31 g/dL (ref 30.0–36.0)
MCV: 79.2 fL (ref 78.0–100.0)
MONO ABS: 0.8 10*3/uL (ref 0.1–1.0)
Monocytes Relative: 10 % (ref 3–12)
NEUTROS ABS: 4.4 10*3/uL (ref 1.7–7.7)
NEUTROS PCT: 53 % (ref 43–77)
PLATELETS: 295 10*3/uL (ref 150–400)
RBC: 5.42 MIL/uL — ABNORMAL HIGH (ref 3.87–5.11)
RDW: 15.5 % (ref 11.5–15.5)
WBC: 8.2 10*3/uL (ref 4.0–10.5)

## 2013-08-22 LAB — COMPREHENSIVE METABOLIC PANEL
ALBUMIN: 3.8 g/dL (ref 3.5–5.2)
ALT: 14 U/L (ref 0–35)
AST: 19 U/L (ref 0–37)
Alkaline Phosphatase: 103 U/L (ref 39–117)
BILIRUBIN TOTAL: 0.7 mg/dL (ref 0.3–1.2)
BUN: 9 mg/dL (ref 6–23)
CHLORIDE: 101 meq/L (ref 96–112)
CO2: 27 mEq/L (ref 19–32)
CREATININE: 0.76 mg/dL (ref 0.50–1.10)
Calcium: 10.2 mg/dL (ref 8.4–10.5)
GFR calc Af Amer: >90 mL/min (ref >90)
GFR calc non Af Amer: 82 mL/min — ABNORMAL LOW (ref >90)
Glucose, Bld: 96 mg/dL (ref 70–99)
POTASSIUM: 3.9 meq/L (ref 3.7–5.3)
SODIUM: 140 meq/L (ref 137–147)
Total Protein: 7.3 g/dL (ref 6.0–8.3)

## 2013-08-22 NOTE — ED Notes (Signed)
The pt has had total body numbness for the past 3 weeks.  She is also c/o some lt wrist pain

## 2013-08-22 NOTE — ED Notes (Addendum)
Patient returned from X-ray 

## 2013-08-22 NOTE — ED Notes (Signed)
Patient transported to XR. 

## 2013-08-22 NOTE — ED Notes (Signed)
Pt states that she fell on Sunday and hurt her left wrist and right hip. There is an area of swelling on her left wrist. Pt states that she also had SI x 2 days, but denies at this time.

## 2013-08-23 ENCOUNTER — Encounter (HOSPITAL_COMMUNITY): Payer: Self-pay | Admitting: Emergency Medicine

## 2013-08-23 DIAGNOSIS — M7989 Other specified soft tissue disorders: Secondary | ICD-10-CM

## 2013-08-23 DIAGNOSIS — F332 Major depressive disorder, recurrent severe without psychotic features: Secondary | ICD-10-CM

## 2013-08-23 DIAGNOSIS — R45851 Suicidal ideations: Secondary | ICD-10-CM

## 2013-08-23 DIAGNOSIS — F339 Major depressive disorder, recurrent, unspecified: Secondary | ICD-10-CM | POA: Diagnosis not present

## 2013-08-23 MED ORDER — ENSURE COMPLETE PO LIQD
237.0000 mL | Freq: Three times a day (TID) | ORAL | Status: DC
  Administered 2013-08-23 – 2013-08-24 (×2): 237 mL via ORAL
  Filled 2013-08-23 (×4): qty 237

## 2013-08-23 MED ORDER — ALUM & MAG HYDROXIDE-SIMETH 200-200-20 MG/5ML PO SUSP
30.0000 mL | ORAL | Status: DC | PRN
Start: 2013-08-23 — End: 2013-08-24
  Administered 2013-08-23 – 2013-08-24 (×2): 30 mL via ORAL
  Filled 2013-08-23 (×2): qty 30

## 2013-08-23 MED ORDER — CITALOPRAM HYDROBROMIDE 10 MG PO TABS
20.0000 mg | ORAL_TABLET | Freq: Every day | ORAL | Status: DC
  Filled 2013-08-23: qty 2

## 2013-08-23 MED ORDER — IBUPROFEN 400 MG PO TABS
600.0000 mg | ORAL_TABLET | Freq: Three times a day (TID) | ORAL | Status: DC | PRN
  Filled 2013-08-23 (×2): qty 1

## 2013-08-23 MED ORDER — NICOTINE 21 MG/24HR TD PT24: 21.0000 mg | MEDICATED_PATCH | Freq: Every day | TRANSDERMAL | Status: DC

## 2013-08-23 MED ORDER — LORAZEPAM 1 MG PO TABS: 1.0000 mg | ORAL_TABLET | Freq: Three times a day (TID) | ORAL | Status: DC | PRN

## 2013-08-23 MED ORDER — ACETAMINOPHEN 160 MG/5ML PO SOLN
650.0000 mg | Freq: Once | ORAL | Status: AC
  Administered 2013-08-23: 650 mg via ORAL
  Filled 2013-08-23: qty 20.3

## 2013-08-23 MED ORDER — ESCITALOPRAM OXALATE 5 MG/5ML PO SOLN: 10.0000 mg | Freq: Every day | ORAL | Status: DC

## 2013-08-23 NOTE — Progress Notes (Addendum)
Weekend CSW spoke with [facility] staff member Gwynn regarding need for tele-assessment. CSW informed that patient is on list to be assessed.   Samuella Bruin, MSW, LCSWA Clinical Social Worker [facility] Emergency Dept. [phone]

## 2013-08-23 NOTE — Progress Notes (Signed)
VASCULAR LAB PRELIMINARY  PRELIMINARY  PRELIMINARY  PRELIMINARY  Right lower extremity venous Doppler completed.    Preliminary report:  There is no DVT or SVT noted in the right lower extremity.  Kern Alberta, RVT 08/23/2013, 11:27 AM

## 2013-08-23 NOTE — ED Notes (Signed)
pts valuables given to security to lock up including money, cards, check.

## 2013-08-23 NOTE — ED Notes (Signed)
Pt ambulated from room to bathroom - tolerated well. No complaints.

## 2013-08-23 NOTE — ED Notes (Signed)
Pt refuses to put blue scrubs on or to have belongings inventoried at bedside and removed; pt states she wants a "white RN" and "white NT"; Charge RN made aware of SI ideations and request for another RN; [name] room door kept open and Md notified of SI ideations; Pt belongs allowed to stay at bedside until transfer to Pod C;

## 2013-08-23 NOTE — BH Assessment (Signed)
Received call for assessment. Spoke to Dr. Marisa Severin who said Dr. Brandy Hale notes are not available and to hold off on TTS assessment until Dr. Rhunette Croft returns on day shift.  Harlin Rain Ria Comment, [facility] Triage Specialist [phone]

## 2013-08-23 NOTE — ED Provider Notes (Signed)
CSN: [id number]     Arrival date & time 08/22/13  1727 History   First MD Initiated Contact with Patient 08/22/13 2130     Chief Complaint  Patient presents with  . Numbness     (Consider location/radiation/quality/duration/timing/severity/associated sxs/prior Treatment) HPI Comments: Pt comes in with cc of numbness. Pt has hx of depression and HTN. Reports that she has been having bilateral upper and lower extremity numbness x 2 months (told 3 weeks to triage). There is no weakness in her upper or lower extremities with that. Numbness is described as pins and needles. No neck pain or neck trauma - but has been having intermittent falls, as her legs will give out. Denies previous hx of same. No headaches, no n/v/f/c. Pt has no chest pains or dib. She also reports feeling suicidal. Has hx of depression. Previous attempt of hanging.  The history is provided by the patient.    Past Medical History  Diagnosis Date  . GERD (gastroesophageal reflux disease)   . Mood disorder   . Depression   . Difficulty swallowing   . Hypertension    Past Surgical History  Procedure Laterality Date  . Hip surgery    . Arm surgery     . Abdominal hysterectomy     No family history on file. History  Substance Use Topics  . Smoking status: Never Smoker   . Smokeless tobacco: Not on file  . Alcohol Use: No   OB History   Grav Para Term Preterm Abortions TAB SAB Ect Mult Living                 Review of Systems  Constitutional: Positive for activity change. Negative for fever.  HENT: Negative for facial swelling.   Respiratory: Negative for cough, shortness of breath and wheezing.   Cardiovascular: Negative for chest pain.  Gastrointestinal: Negative for nausea, vomiting, abdominal pain, diarrhea, constipation, blood in stool and abdominal distention.  Genitourinary: Negative for dysuria, hematuria and difficulty urinating.  Musculoskeletal: Negative for neck pain and neck stiffness.  Skin:  Negative for color change.  Neurological: Positive for numbness. Negative for speech difficulty and headaches.  Hematological: Does not bruise/bleed easily.  Psychiatric/Behavioral: Positive for suicidal ideas. Negative for confusion. The patient is not nervous/anxious and is not hyperactive.       Allergies  Dalmane; Penicillins; and Sulfa antibiotics  Home Medications   Prior to Admission medications   Medication Sig Start Date End Date Taking? Authorizing Provider  acetaminophen (TYLENOL) 160 MG/5ML suspension Take 480 mg by mouth 2 (two) times daily as needed. For pain.   Yes Historical Provider, MD  aluminum-magnesium hydroxide-simethicone (MAALOX) 200-200-20 MG/5ML SUSP Take 30 mLs by mouth 4 (four) times daily -  before meals and at bedtime.   Yes Historical Provider, MD  Ensure Plus (ENSURE PLUS) LIQD Take 237 mLs by mouth 3 (three) times daily between meals.   Yes Historical Provider, MD   BP 140/82  Pulse 74  Temp(Src) 97.9 F (36.6 C) (Oral)  Resp 18  SpO2 96% Physical Exam  Nursing note and vitals reviewed. Constitutional: She is oriented to person, place, and time. She appears well-developed and well-nourished.  HENT:  Head: Normocephalic and atraumatic.  Eyes: EOM are normal. Pupils are equal, round, and reactive to light.  Neck: Neck supple.  Cardiovascular: Normal rate, regular rhythm and normal heart sounds.   No murmur heard. Pulmonary/Chest: Effort normal. No respiratory distress.  Abdominal: Soft. She exhibits no distension. There is no  tenderness. There is no rebound and no guarding.  Musculoskeletal:  Pt has tenderness over the sacral region No step offs, no erythema. Pt has 2+ patellar reflex bilaterally. Able to discriminate between sharp and dull. Able to ambulate   Neurological: She is alert and oriented to person, place, and time.  Skin: Skin is warm and dry.    ED Course  Procedures (including critical care time) Labs Review Labs Reviewed   CBC WITH DIFFERENTIAL - Abnormal; Notable for the following:    RBC 5.42 (*)    MCH 24.5 (*)    All other components within normal limits  COMPREHENSIVE METABOLIC PANEL - Abnormal; Notable for the following:    GFR calc non Af Amer 82 (*)    All other components within normal limits    Imaging Review Dg Forearm Left  08/22/2013   CLINICAL DATA:  Larey SeatFell.  Left forearm pain.  EXAM: LEFT FOREARM - 2 VIEW  COMPARISON:  None.  FINDINGS: The wrist and elbow joints are maintained. Negative ulnar variance is noted at the wrist. No acute forearm fracture.  IMPRESSION: No acute bony findings.   Electronically Signed   By: Loralie ChampagneMark  Gallerani M.D.   On: 08/22/2013 23:41   Dg Hand Complete Left  08/22/2013   CLINICAL DATA:  Left hand pain status post fall.  EXAM: LEFT HAND - COMPLETE 3+ VIEW  COMPARISON:  Forearm of today's date.  FINDINGS: There is diffuse osteopenia. 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 Milder osteoarthritic changes are noted at the MCP and PIP joints. There is no acute tail angio or metacarpal fracture. The carpal bones are intact. Mild degenerative radiocarpal joint, intercarpal, and carpometacarpal joint changes are present. The overlying soft tissues are normal.  IMPRESSION: There is no acute bony abnormality of the left hand.   Electronically Signed   By: David  SwazilandJordan   On: 08/22/2013 23:43     EKG Interpretation None      MDM   Final diagnoses:  None  Suicidal ideations Depression Non specific numbness - bilateral upper and lower extremities  Pt comes in with cc of numbness - B upper and lower ext. Neuro exam reveals subjective numbness, able to differentiate between sharp and dull. Neck exam is normal. Reflexes are normal. Unsure what the cause is - but we have no red flags to be concerned about the spinal cord compressions - and there is no need for acute workup for her sx. Advised PCP f/u and possible neuro  consultations.  For her SI - we have consulted psych.   9:00 AM  August 23, 2013  Pt unchanged from y'day, as far her her exam is concerned. Ambulating. Still having SI. Labs normal.  Filed Vitals:   08/23/13 0524  BP: 140/82  Pulse: 74  Temp: 97.9 F (36.6 C)  Resp: 18     Devontre Siedschlag, MD 08/23/13 0901

## 2013-08-23 NOTE — Consult Note (Signed)
Telepsych Consultation   Reason for Consult:  Suicidal ideation, Depressed mood Referring Physician:  EDP Shannon Hurst is an 73 y.o. female.  Assessment: AXIS I:  Major Depression, Recurrent severe AXIS II:  Deferred AXIS III:   Past Medical History  Diagnosis Date  . GERD (gastroesophageal reflux disease)   . Mood disorder   . Depression   . Difficulty swallowing   . Hypertension   . H/O suicide attempt     attempted to hang self   AXIS IV:  other psychosocial or environmental problems, problems related to social environment and problems with primary support group AXIS V:  41-50 serious symptoms  Plan:  Recommend psychiatric Inpatient admission when medically cleared.  Subjective:   Shannon Hurst is a 73 y.o. female patient admitted with Major depressive d/o, recurrent, suicidal thoughts.  HPI:  73 Year old caucasian female was assessed today via tele psych for feeling depressed and suicidal.  She rated her depression 10/10, 10 being severe depression.   Patient have been seen and admitted in our [facility] in the past.  Her last admission  to our [facility] was 2012 and the rest of her visits have been to the ER.   Patient reported an admission at [facility] psychiatric unit 3 years ago.  Patient was brought to [facility] hospital ER after a fall .  Patient also was upset after seeing her great niece who she thought needed her help for her mental well being. She became angry and suicidal when  she could not convince her niece to move in with her from [place].    Patient live alone and has a hx of previous suicide attempt by hanging.  This time patient plans to hang herself with cloth hanger.  Patient has been diagnosed with depression by her [facility] member, Dr Marlowe Sax.  Patient reports she has not been on any medications.  Patient is still endorsing suicide and stated she is not safe to go home alone.  She reported poor sleep and poor appetite.  Patient reported swallowing  difficulty, she denied feeling hopeless and helpless.  Patient reported hearing voices repeatedly telling her to go to the hospital for help with her depression.  Patient states she is afraid to go home alone and hurt herself.  We have accepted her for admission and we are at capacity at this time.  We will seek placement at any facility that has available bed for her.  We advise that patient may start low dose antidepressant considering her age.  HPI Elements:   Location:  Major depressive d/o recurrent. Quality:  Depressed mmod, sad, poor sleep and appetite, depression 10/10. Severity:  severe, depression 10/10, suicidal ideation to hang herself. Context:  fall, loneliness.  Past Psychiatric History: Past Medical History  Diagnosis Date  . GERD (gastroesophageal reflux disease)   . Mood disorder   . Depression   . Difficulty swallowing   . Hypertension   . H/O suicide attempt     attempted to hang self    reports that she has never smoked. She does not have any smokeless tobacco history on file. She reports that she does not drink alcohol or use illicit drugs. No family history on file.       Allergies:   Allergies  Allergen Reactions  . Dalmane [Flurazepam Hcl] Rash  . Penicillins Rash  . Sulfa Antibiotics Rash    ACT Assessment Complete:  No:   Past Psychiatric History: Diagnosis:  Major depression, recurrent  Hospitalizations:  Yes, [facility], [facility]  Outpatient Care:  no  Substance Abuse Care:  denied  Self-Mutilation:  denied  Suicidal Attempts: yes by hanging  Homicidal Behaviors:  denied   Violent Behaviors:  denied   Place of Residence:  [place] Marital Status:  single Employed/Unemployed: unemployed Education:  unknown Family Supports:  no Objective: Blood pressure 140/82, pulse 74, temperature 97.9 F (36.6 C), temperature source Oral, resp. rate 18, SpO2 96.00%.There is no weight on file to calculate BMI. Results for orders placed  during the hospital encounter of 08/22/13 (from the past 72 hour(s))  CBC WITH DIFFERENTIAL     Status: Abnormal   Collection Time    08/22/13  5:42 PM      Result Value Ref Range   WBC 8.2  4.0 - 10.5 K/uL   RBC 5.42 (*) 3.87 - 5.11 MIL/uL   Hemoglobin 13.3  12.0 - 15.0 g/dL   HCT 42.9  36.0 - 46.0 %   MCV 79.2  78.0 - 100.0 fL   MCH 24.5 (*) 26.0 - 34.0 pg   MCHC 31.0  30.0 - 36.0 g/dL   RDW 15.5  11.5 - 15.5 %   Platelets 295  150 - 400 K/uL   Neutrophils Relative % 53  43 - 77 %   Neutro Abs 4.4  1.7 - 7.7 K/uL   Lymphocytes Relative 36  12 - 46 %   Lymphs Abs 2.9  0.7 - 4.0 K/uL   Monocytes Relative 10  3 - 12 %   Monocytes Absolute 0.8  0.1 - 1.0 K/uL   Eosinophils Relative 1  0 - 5 %   Eosinophils Absolute 0.1  0.0 - 0.7 K/uL   Basophils Relative 0  0 - 1 %   Basophils Absolute 0.0  0.0 - 0.1 K/uL  COMPREHENSIVE METABOLIC PANEL     Status: Abnormal   Collection Time    08/22/13  5:42 PM      Result Value Ref Range   Sodium 140  137 - 147 mEq/L   Potassium 3.9  3.7 - 5.3 mEq/L   Chloride 101  96 - 112 mEq/L   CO2 27  19 - 32 mEq/L   Glucose, Bld 96  70 - 99 mg/dL   BUN 9  6 - 23 mg/dL   Creatinine, Ser 0.76  0.50 - 1.10 mg/dL   Calcium 10.2  8.4 - 10.5 mg/dL   Total Protein 7.3  6.0 - 8.3 g/dL   Albumin 3.8  3.5 - 5.2 g/dL   AST 19  0 - 37 U/L   ALT 14  0 - 35 U/L   Alkaline Phosphatase 103  39 - 117 U/L   Total Bilirubin 0.7  0.3 - 1.2 mg/dL   GFR calc non Af Amer 82 (*) >90 mL/min   GFR calc Af Amer >90  >90 mL/min   Comment: (NOTE)     The eGFR has been calculated using the CKD EPI equation.     This calculation has not been validated in all clinical situations.     eGFR's persistently <90 mL/min signify possible Chronic Kidney     Disease.   Labs are reviewed and are pertinent for Unremarkable.  Current Facility-Administered Medications  Medication Dose Route Frequency Provider Last Rate Last Dose  . alum & mag hydroxide-simeth (MAALOX/MYLANTA)  200-200-20 MG/5ML suspension 30 mL  30 mL Oral PRN Varney Biles, MD   30 mL at 08/23/13 1225  .  feeding supplement (ENSURE COMPLETE) (ENSURE COMPLETE) liquid 237 mL  237 mL Oral TID BM Ankit Nanavati, MD      . ibuprofen (ADVIL,MOTRIN) tablet 600 mg  600 mg Oral Q8H PRN Ankit Nanavati, MD      . LORazepam (ATIVAN) tablet 1 mg  1 mg Oral Q8H PRN Varney Biles, MD       Current Outpatient Prescriptions  Medication Sig Dispense Refill  . acetaminophen (TYLENOL) 160 MG/5ML suspension Take 480 mg by mouth 2 (two) times daily as needed. For pain.      Marland Kitchen aluminum-magnesium hydroxide-simethicone (MAALOX) 185-501-58 MG/5ML SUSP Take 30 mLs by mouth 4 (four) times daily -  before meals and at bedtime.      . Ensure Plus (ENSURE PLUS) LIQD Take 237 mLs by mouth 3 (three) times daily between meals.        Psychiatric Specialty Exam:     Blood pressure 140/82, pulse 74, temperature 97.9 F (36.6 C), temperature source Oral, resp. rate 18, SpO2 96.00%.There is no weight on file to calculate BMI.  General Appearance: Casual  Eye Contact::  Good  Speech:  Clear and Coherent and Pressured  Volume:  Normal  Mood:  Depressed  Affect:  Congruent, Depressed and Flat  Thought Process:  Coherent  Orientation:  Full (Time, Place, and Person)  Thought Content:  Hallucinations: Auditory  Suicidal Thoughts:  Yes.  with intent/plan  Homicidal Thoughts:  No  Memory:  Immediate;   Good Recent;   Fair Remote;   Fair  Judgement:  Fair  Insight:  Good  Psychomotor Activity:  Normal  Concentration:  Good  Recall:  Fair  Akathisia:  NA  Handed:  Right  AIMS (if indicated):     Assets:  Desire for Improvement  Sleep:      Treatment Plan Summary:  We recommend low dose antidepressant considering  Patient's age.  We suggest Celexa 20 mg po daily for depression. We will seek admission bed at any hospital for her safety and stabilization. Medication Management  Disposition:  Addmission at any hospital with  available bed.  Spoke  With Dr Kathrynn Humble at [facility] ER who agree with the plan of care.    Delfin Gant   PMHNP-BC 08/23/2013 1:06 PM

## 2013-08-23 NOTE — ED Notes (Signed)
Pt placed in paper scrubs, wanded by security.

## 2013-08-24 DIAGNOSIS — F339 Major depressive disorder, recurrent, unspecified: Secondary | ICD-10-CM | POA: Diagnosis not present

## 2013-08-24 LAB — RAPID URINE DRUG SCREEN, HOSP PERFORMED
Amphetamines: NOT DETECTED
Barbiturates: NOT DETECTED
Benzodiazepines: NOT DETECTED
COCAINE: NOT DETECTED
Opiates: NOT DETECTED
Tetrahydrocannabinol: NOT DETECTED

## 2013-08-24 LAB — ETHANOL

## 2013-08-24 MED ORDER — ACETAMINOPHEN 160 MG/5ML PO SOLN
650.0000 mg | Freq: Once | ORAL | Status: AC
  Administered 2013-08-24: 650 mg via ORAL
  Filled 2013-08-24: qty 20.3

## 2013-08-24 MED ORDER — ACETAMINOPHEN 160 MG/5ML PO SOLN
650.0000 mg | Freq: Four times a day (QID) | ORAL | Status: DC | PRN
  Administered 2013-08-24: 650 mg via ORAL
  Filled 2013-08-24: qty 20.3

## 2013-08-24 NOTE — Progress Notes (Addendum)
1515 pt has been accepted to [facility] by Dr. Althea Grimmer to their [facility].  Report number in [phone].  [name] nurse Kriste Basque was called and made aware and given updated information.   Tomi Bamberger Disposition MHT

## 2013-08-24 NOTE — Progress Notes (Signed)
Pt has been declined at [facility]. [facility] per [place] d/t pt's acuity.  Tomi Bamberger Disposition MHT

## 2013-08-24 NOTE — Progress Notes (Signed)
Placed phone call to [facility] and spoke to [place], states they needed more info before giving referral to MD to review.  Asked for UDS which was ordered and done this am.  Addittional information has been faxed to [facility] and will call later to follow-up.   Shannon Hurst Disposition MHT

## 2013-08-24 NOTE — BH Assessment (Signed)
[facility] Assessment Progress Note Completed pt's reassessment via telepsych.  Pt says that nothing has changes since the last assessment and continues to endorse SI, and requests a bed at [facility].  She states that she has little family support and wants to be close where friends can visit.  Pt denies HI, A/V hallucinations.  Soon after reassessment, TTS received a call that pt had been accepted at [facility].

## 2013-08-24 NOTE — Progress Notes (Addendum)
[facility]:  Referral Faxed [facility]: Referral Faxed 6 Geri beds available [facility]: Referral Faxed no beds till Monday 6/08/25/13 but will review [facility]: Referral Faxed will review  Coleson Kant Cathey Disposition MHT

## 2013-08-24 NOTE — Progress Notes (Signed)
ED CM consulted with patient regarding PCP. Patient was a [facility] patient. Pt reports she does not have a PCP.  She states, she was just awarded [employer]. Offered to assist patient with finding a PCP.  Pt is agreeable, verified information. Payor source Medicare/ Medicaid.  Patient is IVC'd and awaiting placement for inpatient psych facility. Pt presently does not have a phone. Discussed the Medicaid Free phones with patient pt is agreeable. Attempted to contact phone enrollment spealicalist LVM x 2 today.

## 2013-10-01 ENCOUNTER — Emergency Department (HOSPITAL_COMMUNITY): Payer: Medicare Other

## 2013-10-01 ENCOUNTER — Encounter (HOSPITAL_COMMUNITY): Payer: Self-pay | Admitting: Emergency Medicine

## 2013-10-01 ENCOUNTER — Emergency Department (HOSPITAL_COMMUNITY)
Admission: EM | Admit: 2013-10-01 | Discharge: 2013-10-01 | Disposition: A | Discharge status: Home / Self Care | Payer: Medicare Other | Attending: Emergency Medicine | Admitting: Emergency Medicine

## 2013-10-01 DIAGNOSIS — Z880 Allergy status to penicillin: Secondary | ICD-10-CM | POA: Insufficient documentation

## 2013-10-01 DIAGNOSIS — I10 Essential (primary) hypertension: Secondary | ICD-10-CM | POA: Diagnosis not present

## 2013-10-01 DIAGNOSIS — M79609 Pain in unspecified limb: Secondary | ICD-10-CM | POA: Diagnosis not present

## 2013-10-01 DIAGNOSIS — Z8659 Personal history of other mental and behavioral disorders: Secondary | ICD-10-CM | POA: Insufficient documentation

## 2013-10-01 DIAGNOSIS — K219 Gastro-esophageal reflux disease without esophagitis: Secondary | ICD-10-CM | POA: Insufficient documentation

## 2013-10-01 DIAGNOSIS — R079 Chest pain, unspecified: Secondary | ICD-10-CM | POA: Diagnosis present

## 2013-10-01 DIAGNOSIS — Z79899 Other long term (current) drug therapy: Secondary | ICD-10-CM | POA: Insufficient documentation

## 2013-10-01 DIAGNOSIS — M25559 Pain in unspecified hip: Secondary | ICD-10-CM | POA: Insufficient documentation

## 2013-10-01 LAB — CBC
HCT: 42.4 % (ref 36.0–46.0)
HEMOGLOBIN: 13.4 g/dL (ref 12.0–15.0)
MCH: 25.1 pg — ABNORMAL LOW (ref 26.0–34.0)
MCHC: 31.6 g/dL (ref 30.0–36.0)
MCV: 79.4 fL (ref 78.0–100.0)
Platelets: 242 10*3/uL (ref 150–400)
RBC: 5.34 MIL/uL — ABNORMAL HIGH (ref 3.87–5.11)
RDW: 15.6 % — ABNORMAL HIGH (ref 11.5–15.5)
WBC: 8.6 10*3/uL (ref 4.0–10.5)

## 2013-10-01 LAB — HEPATIC FUNCTION PANEL
ALT: 18 U/L (ref 0–35)
AST: 22 U/L (ref 0–37)
Albumin: 4.2 g/dL (ref 3.5–5.2)
Alkaline Phosphatase: 106 U/L (ref 39–117)
BILIRUBIN DIRECT: 0.2 mg/dL (ref 0.0–0.3)
BILIRUBIN INDIRECT: 1.4 mg/dL — AB (ref 0.3–0.9)
Total Bilirubin: 1.6 mg/dL — ABNORMAL HIGH (ref 0.3–1.2)
Total Protein: 7.7 g/dL (ref 6.0–8.3)

## 2013-10-01 LAB — BASIC METABOLIC PANEL
ANION GAP: 13 (ref 5–15)
BUN: 12 mg/dL (ref 6–23)
CHLORIDE: 101 meq/L (ref 96–112)
CO2: 25 mEq/L (ref 19–32)
Calcium: 10.2 mg/dL (ref 8.4–10.5)
Creatinine, Ser: 0.71 mg/dL (ref 0.50–1.10)
GFR calc non Af Amer: 84 mL/min — ABNORMAL LOW (ref >90)
Glucose, Bld: 103 mg/dL — ABNORMAL HIGH (ref 70–99)
POTASSIUM: 4.2 meq/L (ref 3.7–5.3)
SODIUM: 139 meq/L (ref 137–147)

## 2013-10-01 LAB — I-STAT TROPONIN, ED: TROPONIN I, POC: 0 ng/mL (ref 0.00–0.08)

## 2013-10-01 LAB — LIPASE, BLOOD: LIPASE: 37 U/L (ref 11–59)

## 2013-10-01 LAB — D-DIMER, QUANTITATIVE: D-Dimer, Quant: 0.27 ug/mL-FEU (ref 0.00–0.48)

## 2013-10-01 MED ORDER — SODIUM CHLORIDE 0.9 % IV SOLN
INTRAVENOUS | Status: DC
  Administered 2013-10-01: 13:00:00 via INTRAVENOUS

## 2013-10-01 MED ORDER — GI COCKTAIL ~~LOC~~
30.0000 mL | Freq: Once | ORAL | Status: AC
  Administered 2013-10-01: 30 mL via ORAL
  Filled 2013-10-01: qty 30

## 2013-10-01 MED ORDER — ACETAMINOPHEN 325 MG PO TABS
650.0000 mg | ORAL_TABLET | Freq: Once | ORAL | Status: DC
  Filled 2013-10-01: qty 2

## 2013-10-01 MED ORDER — ACETAMINOPHEN 160 MG/5ML PO SOLN
650.0000 mg | Freq: Once | ORAL | Status: AC
  Administered 2013-10-01: 650 mg via ORAL
  Filled 2013-10-01: qty 20.3

## 2013-10-01 NOTE — ED Notes (Signed)
Diane Pennix: [phone]: [phone] [name] nurse aide.

## 2013-10-01 NOTE — ED Provider Notes (Signed)
CSN: [id number]     Arrival date & time 10/01/13  0957 History   First MD Initiated Contact with Patient 10/01/13 1042     Chief Complaint  Patient presents with  . Chest Pain    left  . Hip Pain    right/left  . Arm Pain    left     (Consider location/radiation/quality/duration/timing/severity/associated sxs/prior Treatment) HPI Comments: Sx started when eating--no emesis, diarrhea, vomiting--pt also feels weak with ambulation--no prior h/o same, nothing makes sx better, no tx used pta  Patient is a 73 y.o. female presenting with chest pain, hip pain, and arm pain. The history is provided by the patient.  Chest Pain Pain location:  L chest Pain quality: sharp   Pain radiates to:  Does not radiate Pain severity:  Mild Onset quality:  Sudden Duration:  2 days Timing:  Constant Progression:  Unchanged Chronicity:  New Hip Pain Associated symptoms include chest pain.  Arm Pain Associated symptoms include chest pain.    Past Medical History  Diagnosis Date  . GERD (gastroesophageal reflux disease)   . Mood disorder   . Depression   . Difficulty swallowing   . Hypertension   . H/O suicide attempt     attempted to hang self   Past Surgical History  Procedure Laterality Date  . Hip surgery    . Arm surgery     . Abdominal hysterectomy     No family history on file. History  Substance Use Topics  . Smoking status: Never Smoker   . Smokeless tobacco: Not on file  . Alcohol Use: No   OB History   Grav Para Term Preterm Abortions TAB SAB Ect Mult Living                 Review of Systems  Cardiovascular: Positive for chest pain.  All other systems reviewed and are negative.     Allergies  Dalmane; Penicillins; and Sulfa antibiotics  Home Medications   Prior to Admission medications   Medication Sig Start Date End Date Taking? Authorizing Provider  acetaminophen (TYLENOL) 160 MG/5ML suspension Take 480 mg by mouth 2 (two) times daily as needed. For pain.    Yes Historical Provider, MD  aluminum-magnesium hydroxide-simethicone (MAALOX) 200-200-20 MG/5ML SUSP Take 30 mLs by mouth 4 (four) times daily -  before meals and at bedtime.   Yes Historical Provider, MD  Ensure Plus (ENSURE PLUS) LIQD Take 237 mLs by mouth 3 (three) times daily between meals.   Yes Historical Provider, MD   BP 154/70  Pulse 80  Temp(Src) 97.8 F (36.6 C) (Oral)  Resp 20  Ht 5' (1.524 m)  SpO2 98% Physical Exam  Nursing note and vitals reviewed. Constitutional: She is oriented to person, place, and time. She appears well-developed and well-nourished.  Non-toxic appearance. No distress.  HENT:  Head: Normocephalic and atraumatic.  Eyes: Conjunctivae, EOM and lids are normal. Pupils are equal, round, and reactive to light.  Neck: Normal range of motion. Neck supple. No tracheal deviation present. No mass present.  Cardiovascular: Normal rate, regular rhythm and normal heart sounds.  Exam reveals no gallop.   No murmur heard. Pulmonary/Chest: Effort normal and breath sounds normal. No stridor. No respiratory distress. She has no decreased breath sounds. She has no wheezes. She has no rhonchi. She has no rales.  Abdominal: Soft. Normal appearance and bowel sounds are normal. She exhibits no distension. There is no tenderness. There is no rebound and no CVA tenderness.  Musculoskeletal: Normal range of motion. She exhibits no edema and no tenderness.  Neurological: She is alert and oriented to person, place, and time. She has normal strength. No cranial nerve deficit or sensory deficit. GCS eye subscore is 4. GCS verbal subscore is 5. GCS motor subscore is 6.  Skin: Skin is warm and dry. No abrasion and no rash noted.  Psychiatric: She has a normal mood and affect. Her speech is normal and behavior is normal.    ED Course  Procedures (including critical care time) Labs Review Labs Reviewed  CBC - Abnormal; Notable for the following:    RBC 5.34 (*)    MCH 25.1 (*)     RDW 15.6 (*)    All other components within normal limits  BASIC METABOLIC PANEL - Abnormal; Notable for the following:    Glucose, Bld 103 (*)    GFR calc non Af Amer 84 (*)    All other components within normal limits  HEPATIC FUNCTION PANEL - Abnormal; Notable for the following:    Total Bilirubin 1.6 (*)    All other components within normal limits  D-DIMER, QUANTITATIVE  LIPASE, BLOOD  I-STAT TROPOININ, ED    Imaging Review No results found.   EKG Interpretation   Date/Time:  Wednesday October 01 2013 10:11:56 EDT Ventricular Rate:  74 PR Interval:  116 QRS Duration: 73 QT Interval:  395 QTC Calculation: 438 R Axis:   49 Text Interpretation:  Sinus rhythm Borderline short PR interval Probable  left atrial enlargement No significant change since last tracing Confirmed  by KNAPP  MD-J, JON ([id number]) on 10/01/2013 10:14:35 AM      MDM   Final diagnoses:  None   Patient given GI cocktail and feels better. Record. Has been negative for ACS, CHF, and pe. Patient stable for discharge    Toy Baker, MD 10/01/13 1424

## 2013-10-01 NOTE — ED Notes (Signed)
Pt states started having L sided chest pain 2 days ago, also having Left and right hip numbness/pain and L lower arm numbness/pain, pt states having shortness of breath/dizziness, denies n/v/d.

## 2013-10-01 NOTE — ED Notes (Signed)
Continuing to call patient's nurse aide to transport pt home, aide not answering phone, voicemail box is full.

## 2013-10-01 NOTE — Discharge Instructions (Signed)
Gastroesophageal Reflux Disease, Adult Gastroesophageal reflux disease (GERD) happens when acid from your stomach flows up into the esophagus. When acid comes in contact with the esophagus, the acid causes soreness (inflammation) in the esophagus. Over time, GERD may create small holes (ulcers) in the lining of the esophagus. CAUSES   Increased body weight. This puts pressure on the stomach, making acid rise from the stomach into the esophagus.  Smoking. This increases acid production in the stomach.  Drinking alcohol. This causes decreased pressure in the lower esophageal sphincter (valve or ring of muscle between the esophagus and stomach), allowing acid from the stomach into the esophagus.  Late evening meals and a full stomach. This increases pressure and acid production in the stomach.  A malformed lower esophageal sphincter. Sometimes, no cause is found. SYMPTOMS   Burning pain in the lower part of the mid-chest behind the breastbone and in the mid-stomach area. This may occur twice a week or more often.  Trouble swallowing.  Sore throat.  Dry cough.  Asthma-like symptoms including chest tightness, shortness of breath, or wheezing. DIAGNOSIS  Your caregiver may be able to diagnose GERD based on your symptoms. In some cases, X-rays and other tests may be done to check for complications or to check the condition of your stomach and esophagus. TREATMENT  Your caregiver may recommend over-the-counter or prescription medicines to help decrease acid production. Ask your caregiver before starting or adding any new medicines.  HOME CARE INSTRUCTIONS   Change the factors that you can control. Ask your caregiver for guidance concerning weight loss, quitting smoking, and alcohol consumption.  Avoid foods and drinks that make your symptoms worse, such as:  Caffeine or alcoholic drinks.  Chocolate.  Peppermint or mint flavorings.  Garlic and onions.  Spicy foods.  Citrus fruits,  such as oranges, lemons, or limes.  Tomato-based foods such as sauce, chili, salsa, and pizza.  Fried and fatty foods.  Avoid lying down for the 3 hours prior to your bedtime or prior to taking a nap.  Eat small, frequent meals instead of large meals.  Wear loose-fitting clothing. Do not wear anything tight around your waist that causes pressure on your stomach.  Raise the head of your bed 6 to 8 inches with wood blocks to help you sleep. Extra pillows will not help.  Only take over-the-counter or prescription medicines for pain, discomfort, or fever as directed by your caregiver.  Do not take aspirin, ibuprofen, or other nonsteroidal anti-inflammatory drugs (NSAIDs). SEEK IMMEDIATE MEDICAL CARE IF:   You have pain in your arms, neck, jaw, teeth, or back.  Your pain increases or changes in intensity or duration.  You develop nausea, vomiting, or sweating (diaphoresis).  You develop shortness of breath, or you faint.  Your vomit is green, yellow, black, or looks like coffee grounds or blood.  Your stool is red, bloody, or black. These symptoms could be signs of other problems, such as heart disease, gastric bleeding, or esophageal bleeding. MAKE SURE YOU:   Understand these instructions.  Will watch your condition.  Will get help right away if you are not doing well or get worse. Document Released: 12/14/2004 Document Revised: 05/29/2011 Document Reviewed: 09/23/2010 ExitCare Patient Information 2015 [employer]. This information is not intended to replace advice given to you by your health care provider. Make sure you discuss any questions you have with your health care provider.  

## 2013-10-01 NOTE — ED Notes (Signed)
Continuing to attempt contact with [name] nurse aide, aide not answering.

## 2014-07-02 ENCOUNTER — Encounter (HOSPITAL_COMMUNITY): Payer: Self-pay | Admitting: *Deleted

## 2014-07-02 ENCOUNTER — Emergency Department (HOSPITAL_COMMUNITY)
Admission: EM | Admit: 2014-07-02 | Discharge: 2014-07-03 | Disposition: A | Discharge status: Other Acute Inpatient Hospital | Payer: Medicare Other | Attending: Emergency Medicine | Admitting: Emergency Medicine

## 2014-07-02 ENCOUNTER — Emergency Department (HOSPITAL_COMMUNITY): Payer: Medicare Other

## 2014-07-02 DIAGNOSIS — Z8719 Personal history of other diseases of the digestive system: Secondary | ICD-10-CM | POA: Insufficient documentation

## 2014-07-02 DIAGNOSIS — Z90710 Acquired absence of both cervix and uterus: Secondary | ICD-10-CM | POA: Insufficient documentation

## 2014-07-02 DIAGNOSIS — Z86718 Personal history of other venous thrombosis and embolism: Secondary | ICD-10-CM | POA: Insufficient documentation

## 2014-07-02 DIAGNOSIS — R109 Unspecified abdominal pain: Secondary | ICD-10-CM | POA: Insufficient documentation

## 2014-07-02 DIAGNOSIS — I10 Essential (primary) hypertension: Secondary | ICD-10-CM | POA: Diagnosis not present

## 2014-07-02 DIAGNOSIS — Z880 Allergy status to penicillin: Secondary | ICD-10-CM | POA: Diagnosis not present

## 2014-07-02 DIAGNOSIS — Z8659 Personal history of other mental and behavioral disorders: Secondary | ICD-10-CM | POA: Diagnosis not present

## 2014-07-02 DIAGNOSIS — R45851 Suicidal ideations: Secondary | ICD-10-CM | POA: Insufficient documentation

## 2014-07-02 DIAGNOSIS — Z79899 Other long term (current) drug therapy: Secondary | ICD-10-CM | POA: Diagnosis not present

## 2014-07-02 HISTORY — DX: Acute embolism and thrombosis of unspecified deep veins of unspecified lower extremity: I82.409

## 2014-07-02 LAB — RAPID URINE DRUG SCREEN, HOSP PERFORMED
AMPHETAMINES: NOT DETECTED
BARBITURATES: NOT DETECTED
Benzodiazepines: NOT DETECTED
COCAINE: NOT DETECTED
OPIATES: NOT DETECTED
Tetrahydrocannabinol: NOT DETECTED

## 2014-07-02 LAB — COMPREHENSIVE METABOLIC PANEL
ALK PHOS: 141 U/L — AB (ref 39–117)
ALT: 15 U/L (ref 0–35)
AST: 21 U/L (ref 0–37)
Albumin: 3.7 g/dL (ref 3.5–5.2)
Anion gap: 10 (ref 5–15)
BILIRUBIN TOTAL: 0.7 mg/dL (ref 0.3–1.2)
BUN: 6 mg/dL (ref 6–23)
CO2: 28 mmol/L (ref 19–32)
CREATININE: 0.8 mg/dL (ref 0.50–1.10)
Calcium: 10.2 mg/dL (ref 8.4–10.5)
Chloride: 102 mmol/L (ref 96–112)
GFR calc Af Amer: 82 mL/min — ABNORMAL LOW (ref >90)
GFR calc non Af Amer: 71 mL/min — ABNORMAL LOW (ref >90)
Glucose, Bld: 125 mg/dL — ABNORMAL HIGH (ref 70–99)
Potassium: 3.7 mmol/L (ref 3.5–5.1)
Sodium: 140 mmol/L (ref 135–145)
Total Protein: 7.5 g/dL (ref 6.0–8.3)

## 2014-07-02 LAB — ACETAMINOPHEN LEVEL: Acetaminophen (Tylenol), Serum: <10 ug/mL — ABNORMAL LOW (ref 10–30)

## 2014-07-02 LAB — URINALYSIS, ROUTINE W REFLEX MICROSCOPIC
Bilirubin Urine: NEGATIVE
Glucose, UA: NEGATIVE mg/dL
Hgb urine dipstick: NEGATIVE
KETONES UR: NEGATIVE mg/dL
NITRITE: NEGATIVE
Protein, ur: NEGATIVE mg/dL
Specific Gravity, Urine: 1.015 (ref 1.005–1.030)
Urobilinogen, UA: 1 mg/dL (ref 0.0–1.0)
pH: 6 (ref 5.0–8.0)

## 2014-07-02 LAB — URINE MICROSCOPIC-ADD ON

## 2014-07-02 LAB — CBC
HCT: 43.8 % (ref 36.0–46.0)
Hemoglobin: 13.5 g/dL (ref 12.0–15.0)
MCH: 23.3 pg — ABNORMAL LOW (ref 26.0–34.0)
MCHC: 30.8 g/dL (ref 30.0–36.0)
MCV: 75.6 fL — ABNORMAL LOW (ref 78.0–100.0)
PLATELETS: 405 10*3/uL — AB (ref 150–400)
RBC: 5.79 MIL/uL — AB (ref 3.87–5.11)
RDW: 15.7 % — ABNORMAL HIGH (ref 11.5–15.5)
WBC: 7.5 10*3/uL (ref 4.0–10.5)

## 2014-07-02 LAB — SALICYLATE LEVEL: Salicylate Lvl: <4 mg/dL (ref 2.8–20.0)

## 2014-07-02 LAB — ETHANOL: Alcohol, Ethyl (B): <5 mg/dL (ref 0–9)

## 2014-07-02 MED ORDER — ACETAMINOPHEN 160 MG/5ML PO SOLN
650.0000 mg | Freq: Four times a day (QID) | ORAL | Status: DC | PRN
  Administered 2014-07-03: 650 mg via ORAL
  Filled 2014-07-02 (×2): qty 20.3

## 2014-07-02 MED ORDER — ACETAMINOPHEN 160 MG/5ML PO SOLN: 650.0000 mg | Freq: Four times a day (QID) | ORAL | Status: DC | PRN

## 2014-07-02 NOTE — ED Notes (Signed)
[facility] called and said Pt can have a bed there tommorrow.

## 2014-07-02 NOTE — ED Notes (Signed)
[facility] been [facility]. The accepting physician is Komissarova. She is going to bed 143. She can be transferred over after noon. Call report to [phone]. The nurse practitioner is The KrogerDarlene Hoggins.

## 2014-07-02 NOTE — ED Notes (Signed)
Care given to Bryan, RN  

## 2014-07-02 NOTE — ED Notes (Addendum)
Pt states someone murdered her niece and now she feels like killing herself (cutting her throat with a hanger).  H/o suicide attempt in the past.  Pt also c/o frequent urination and lower abdominal pain.  Pt also states she would like a new caregiver- the one taking care of her is doing drugs.

## 2014-07-02 NOTE — BH Assessment (Signed)
[facility] Assessment Progress Note   Called [facility] to schedule pt's tele assessment with this clinician.  Also, called EDP Zackowski and gathered clinical information on the pt at [phone].  Casimer LaniusKristen Aneth Schlagel, MS, [facility] Therapeutic Triage Specialist [facility]

## 2014-07-02 NOTE — Progress Notes (Addendum)
Patient's [facility] to [facility], [facility], and [facility].  Hospitals contacted: [facility] - per Marcelino DusterMichelle, fax it.  [facility] - per Karoline CaldwellAngie, fax referral for wait list [facility] - per Joline, might have geri female bed. Fax referral. [facility] - voicemail. [facility] - left voicemail [facility] - per Nicholos JohnsKathleen, will have female geri bed tomorrow. Fax referral.   At capacity: [facility] - per Aimie, only 1 high acuity bed. [facility] - per Morrie SheldonAshley, all full.  CSW will continue to seek placement.   [place] Abtsatia Grey Schlauch, LCSWA Disposition staff 07/02/2014 5:48 PM

## 2014-07-02 NOTE — Progress Notes (Addendum)
[facility] to Dr. Tonita PhoenixKomissarova bed 143, call report to [phone] ( to NP Richardson Dopparlene Huggins). Pt can come at noon tomorrow.  RN Tobi BastosAnna at [facility] informed.  [place] Abtsatia Modesta Sammons, LCSWA Disposition staff 07/02/2014 10:47 PM

## 2014-07-02 NOTE — ED Provider Notes (Signed)
Patient waiting on urinalysis for complete medical clearance. The rest of patient's workup without any significant findings. Chest x-ray is negative EKG without acute changes. Labs without acute abnormalities. CSN: [id number]     Arrival date & time 07/02/14  1317 History   First MD Initiated Contact with Patient 07/02/14 1357     Chief Complaint  Patient presents with  . Suicidal  . Urinary Frequency     (Consider location/radiation/quality/duration/timing/severity/associated sxs/prior Treatment) Patient is a 74 y.o. female presenting with frequency. The history is provided by the patient.  Urinary Frequency Associated symptoms include abdominal pain. Pertinent negatives include no chest pain, no headaches and no shortness of breath.   patient has been feeling depressed since the loss of her niece who was murdered. Patient now feels like cutting herself. Patient told nursing that she felt like cutting her throat with a hanger but she told me that she would use a hanger at this hang herself. Patient has a history of suicide attempt in the past. Patient also complained of frequent urination and lower abdominal pain.  Past Medical History  Diagnosis Date  . GERD (gastroesophageal reflux disease)   . Mood disorder   . Depression   . Difficulty swallowing   . Hypertension   . H/O suicide attempt     attempted to hang self  . DVT (deep venous thrombosis)    Past Surgical History  Procedure Laterality Date  . Hip surgery    . Arm surgery     . Abdominal hysterectomy     No family history on file. History  Substance Use Topics  . Smoking status: Never Smoker   . Smokeless tobacco: Not on file  . Alcohol Use: No   OB History    No data available     Review of Systems  Constitutional: Negative for fever.  HENT: Negative for congestion.   Eyes: Negative for visual disturbance.  Respiratory: Negative for shortness of breath.   Cardiovascular: Negative for chest pain.   Gastrointestinal: Positive for abdominal pain. Negative for nausea and vomiting.  Genitourinary: Positive for dysuria and frequency.  Musculoskeletal: Negative for back pain.  Skin: Negative for rash.  Neurological: Negative for headaches.  Hematological: Does not bruise/bleed easily.  Psychiatric/Behavioral: Negative for confusion.      Allergies  Dalmane; Penicillins; and Sulfa antibiotics  Home Medications   Prior to Admission medications   Medication Sig Start Date End Date Taking? Authorizing Provider  acetaminophen (TYLENOL) 160 MG/5ML suspension Take 480 mg by mouth 2 (two) times daily as needed. For pain.    Historical Provider, MD  aluminum-magnesium hydroxide-simethicone (MAALOX) 200-200-20 MG/5ML SUSP Take 30 mLs by mouth 4 (four) times daily -  before meals and at bedtime.    Historical Provider, MD  Ensure Plus (ENSURE PLUS) LIQD Take 237 mLs by mouth 3 (three) times daily between meals.    Historical Provider, MD   BP 133/78 mmHg  Pulse 53  Temp(Src) 97.6 F (36.4 C) (Oral)  Resp 20  Wt 102 lb (46.267 kg)  SpO2 97% Physical Exam  Constitutional: She is oriented to person, place, and time. She appears well-developed and well-nourished. No distress.  HENT:  Head: Normocephalic and atraumatic.  Mouth/Throat: Oropharynx is clear and moist.  Eyes: Conjunctivae are normal. Pupils are equal, round, and reactive to light.  Neck: Normal range of motion.  Cardiovascular: Normal rate, regular rhythm and normal heart sounds.   Pulmonary/Chest: Effort normal and breath sounds normal. No respiratory distress.  Abdominal: Soft. Bowel sounds are normal. There is no tenderness.  Musculoskeletal: Normal range of motion.  Neurological: She is alert and oriented to person, place, and time. No cranial nerve deficit. She exhibits normal muscle tone. Coordination normal.  Skin: Skin is warm. No rash noted.  Nursing note and vitals reviewed.   ED Course  Procedures (including  critical care time) Labs Review Labs Reviewed  CBC - Abnormal; Notable for the following:    RBC 5.79 (*)    MCV 75.6 (*)    MCH 23.3 (*)    RDW 15.7 (*)    Platelets 405 (*)    All other components within normal limits  COMPREHENSIVE METABOLIC PANEL - Abnormal; Notable for the following:    Glucose, Bld 125 (*)    Alkaline Phosphatase 141 (*)    GFR calc non Af Amer 71 (*)    GFR calc Af Amer 82 (*)    All other components within normal limits  ACETAMINOPHEN LEVEL - Abnormal; Notable for the following:    Acetaminophen (Tylenol), Serum <10.0 (*)    All other components within normal limits  ETHANOL  SALICYLATE LEVEL  URINE RAPID DRUG SCREEN (HOSP PERFORMED)  URINALYSIS, ROUTINE W REFLEX MICROSCOPIC   Results for orders placed or performed during the hospital encounter of 07/02/14  CBC  Result Value Ref Range   WBC 7.5 4.0 - 10.5 K/uL   RBC 5.79 (H) 3.87 - 5.11 MIL/uL   Hemoglobin 13.5 12.0 - 15.0 g/dL   HCT 82.9 56.2 - 13.0 %   MCV 75.6 (L) 78.0 - 100.0 fL   MCH 23.3 (L) 26.0 - 34.0 pg   MCHC 30.8 30.0 - 36.0 g/dL   RDW 86.5 (H) 78.4 - 69.6 %   Platelets 405 (H) 150 - 400 K/uL  Comprehensive metabolic panel  Result Value Ref Range   Sodium 140 135 - 145 mmol/L   Potassium 3.7 3.5 - 5.1 mmol/L   Chloride 102 96 - 112 mmol/L   CO2 28 19 - 32 mmol/L   Glucose, Bld 125 (H) 70 - 99 mg/dL   BUN 6 6 - 23 mg/dL   Creatinine, Ser 2.95 0.50 - 1.10 mg/dL   Calcium 28.4 8.4 - 13.2 mg/dL   Total Protein 7.5 6.0 - 8.3 g/dL   Albumin 3.7 3.5 - 5.2 g/dL   AST 21 0 - 37 U/L   ALT 15 0 - 35 U/L   Alkaline Phosphatase 141 (H) 39 - 117 U/L   Total Bilirubin 0.7 0.3 - 1.2 mg/dL   GFR calc non Af Amer 71 (L) >90 mL/min   GFR calc Af Amer 82 (L) >90 mL/min   Anion gap 10 5 - 15  Ethanol (ETOH)  Result Value Ref Range   Alcohol, Ethyl (B) <5 0 - 9 mg/dL  Acetaminophen level  Result Value Ref Range   Acetaminophen (Tylenol), Serum <10.0 (L) 10 - 30 ug/mL  Salicylate level   Result Value Ref Range   Salicylate Lvl <4.0 2.8 - 20.0 mg/dL     Imaging Review Dg Chest 2 View  07/02/2014   CLINICAL DATA:  Abdominal pain, urinary frequency  EXAM: CHEST  2 VIEW  COMPARISON:  10/01/2013  FINDINGS: Chronic interstitial markings/emphysematous changes. No focal consolidation. No pleural effusion or pneumothorax.  The heart is normal in size.  Degenerative changes of the visualized thoracolumbar spine.  Large hiatal hernia.  IMPRESSION: No evidence of acute cardiopulmonary disease.   Electronically Signed   By:  Charline Bills M.D.   On: 07/02/2014 15:08     EKG Interpretation   Date/Time:  Thursday July 02 2014 14:43:27 EDT Ventricular Rate:  88 PR Interval:  116 QRS Duration: 70 QT Interval:  355 QTC Calculation: 429 R Axis:   33 Text Interpretation:  Sinus rhythm Borderline short PR interval Abnormal  R-wave progression, early transition Confirmed by Otelia Hettinger  MD, Antasia Haider  [phone]) on 07/02/2014 2:56:39 PM      MDM   Final diagnoses:  Abdominal pain  Suicidal ideation    Patient not completed cleared medically is still awaiting urinalysis. The rest of lab workup without any significant abnormalities. No evidence of any sniffing overdose. Patient denies any overdose. Patient states that definitely suicidal use a close hanger to hang herself. Patient is willing to stay voluntarily. Patient unable to take any of medications other than liquids currently only on liquid Tylenol and Maalox.   Patient will be moved over to pot see consult into TSS is been made. Based on patient's statements will require inpatient psychiatric admission.    Vanetta Mulders, MD 07/02/14 [phone]

## 2014-07-02 NOTE — ED Provider Notes (Signed)
Per TTS, they feel that the patient needs to be admitted to geriatric psychiatric facility. They are going to work on placement.  Shannon BaleElliott Jamirah Zelaya, MD 07/02/14 [phone]
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
## 2014-07-02 NOTE — ED Notes (Signed)
Pt not in room.

## 2014-07-03 NOTE — Progress Notes (Signed)
[facility] has been [facility] (bed [facility], Dr. Delanna AhmadiKimissarova, report [phone]) to be transported after 12.  After discussing pt case with Renata Capriceonrad, NP, CSW spoke with [facility] MD, secretary Kriste BasqueBecky, and [facility], regarding need for IVC. Spoke with Almira CoasterGina, psych SW, who will assist with completion of IVC paperwork.   Ilean SkillMeghan Myquan Schaumburg, MSW, LCSWA Clinical Social Work, Disposition  07/03/2014 [phone]

## 2014-07-03 NOTE — ED Notes (Signed)
[place] Sheriff transport called [facility]

## 2014-07-03 NOTE — Clinical Social Work Psych Note (Addendum)
11:25am- Psych CSW tubed completed IVC paperwork to POD [facility].  IVC paperwork has been completed and faxed to Magistrate's office.  Psych CSW confirmed receipt of IVC paperwork with Magistrate [place]. Patient is awaiting being served.    Psych CSW received notification that Involuntary Commitment (IVC) Paperwork was needed.  Psych CSW completed IVC paperwork for physician to sign.  ED secretary [facility] aware and agreeable to have physician sign and tube to 5N for psych CSW to complete IVC process with Magistrate.  Vickii PennaGina Madie Cahn, LCSWA [phone]  Psychiatric & Orthopedics (5N 1-8) Clinical Social Worker

## 2014-07-03 NOTE — ED Notes (Signed)
Patient eating lunch.

## 2014-07-03 NOTE — ED Notes (Signed)
Spoke with [facility] states they will not accept report until transport is here to pick the patient up.

## 2014-07-03 NOTE — ED Notes (Addendum)
RN to room to talk with pt. Pt. Denying SI/HI at this time. Pt. States she "lost her niece 3 months ago, she was murdered." Pt. Stating that the reason why she is here is "because of her nurse yesterday." She states she "said something yesterday that she did not mean." Pt. Thoughts are disorganized at this time. Pt. States she does not want to share what her plan was to hurt herself. When asked what changed her mind, pt. States "she prayed all night long."

## 2014-07-03 NOTE — ED Notes (Signed)
Pt didn't eat any of her breakfast. Has a hx of trouble swallowing. Pt sts, "i get choked easily". Requesting more coffee and milk.

## 2014-07-03 NOTE — ED Notes (Signed)
Per pt's sitter, pt did not want to clean herself up after going to the bathroom. The sitter offered to help the pt, but the pt declined.

## 2014-07-03 NOTE — ED Notes (Signed)
New meal tray ordered for pt.

## 2014-07-03 NOTE — ED Notes (Signed)
Patient belonging given to sheriff department copy kept in files. Copy kept of IVC papers.

## 2014-07-03 NOTE — ED Provider Notes (Signed)
2:33 PM [name].  Well appearing, all questions answered.    Shannon DivineJohn Samay Delcarlo, MD 07/03/14 1435

## 2014-07-03 NOTE — ED Notes (Signed)
Patient refused to sign transfer consent, Patient IVC

## 2014-10-01 ENCOUNTER — Inpatient Hospital Stay (HOSPITAL_COMMUNITY)
Admission: EM | Admit: 2014-10-01 | Discharge: 2014-10-06 | DRG: 309 | Disposition: A | Discharge status: Home / Self Care | Payer: Medicare Other | Attending: Internal Medicine | Admitting: Internal Medicine

## 2014-10-01 ENCOUNTER — Encounter (HOSPITAL_COMMUNITY): Payer: Self-pay | Admitting: Vascular Surgery

## 2014-10-01 ENCOUNTER — Emergency Department (HOSPITAL_COMMUNITY): Payer: Medicare Other

## 2014-10-01 DIAGNOSIS — F319 Bipolar disorder, unspecified: Secondary | ICD-10-CM | POA: Diagnosis not present

## 2014-10-01 DIAGNOSIS — R1310 Dysphagia, unspecified: Secondary | ICD-10-CM | POA: Diagnosis not present

## 2014-10-01 DIAGNOSIS — E869 Volume depletion, unspecified: Secondary | ICD-10-CM | POA: Diagnosis not present

## 2014-10-01 DIAGNOSIS — E010 Iodine-deficiency related diffuse (endemic) goiter: Secondary | ICD-10-CM | POA: Diagnosis not present

## 2014-10-01 DIAGNOSIS — F419 Anxiety disorder, unspecified: Secondary | ICD-10-CM | POA: Diagnosis not present

## 2014-10-01 DIAGNOSIS — Z880 Allergy status to penicillin: Secondary | ICD-10-CM | POA: Diagnosis not present

## 2014-10-01 DIAGNOSIS — Z9114 Patient's other noncompliance with medication regimen: Secondary | ICD-10-CM | POA: Diagnosis present

## 2014-10-01 DIAGNOSIS — R001 Bradycardia, unspecified: Secondary | ICD-10-CM | POA: Diagnosis not present

## 2014-10-01 DIAGNOSIS — I5030 Unspecified diastolic (congestive) heart failure: Secondary | ICD-10-CM

## 2014-10-01 DIAGNOSIS — I5032 Chronic diastolic (congestive) heart failure: Secondary | ICD-10-CM | POA: Diagnosis not present

## 2014-10-01 DIAGNOSIS — I4891 Unspecified atrial fibrillation: Secondary | ICD-10-CM | POA: Diagnosis present

## 2014-10-01 DIAGNOSIS — I10 Essential (primary) hypertension: Secondary | ICD-10-CM | POA: Diagnosis not present

## 2014-10-01 DIAGNOSIS — Z882 Allergy status to sulfonamides status: Secondary | ICD-10-CM

## 2014-10-01 DIAGNOSIS — F0390 Unspecified dementia without behavioral disturbance: Secondary | ICD-10-CM | POA: Diagnosis present

## 2014-10-01 DIAGNOSIS — I480 Paroxysmal atrial fibrillation: Secondary | ICD-10-CM | POA: Diagnosis not present

## 2014-10-01 DIAGNOSIS — Z888 Allergy status to other drugs, medicaments and biological substances status: Secondary | ICD-10-CM | POA: Diagnosis not present

## 2014-10-01 DIAGNOSIS — Z90710 Acquired absence of both cervix and uterus: Secondary | ICD-10-CM | POA: Diagnosis not present

## 2014-10-01 DIAGNOSIS — D509 Iron deficiency anemia, unspecified: Secondary | ICD-10-CM | POA: Diagnosis not present

## 2014-10-01 DIAGNOSIS — R300 Dysuria: Secondary | ICD-10-CM | POA: Diagnosis present

## 2014-10-01 DIAGNOSIS — E876 Hypokalemia: Secondary | ICD-10-CM | POA: Diagnosis present

## 2014-10-01 DIAGNOSIS — K219 Gastro-esophageal reflux disease without esophagitis: Secondary | ICD-10-CM | POA: Diagnosis not present

## 2014-10-01 DIAGNOSIS — R079 Chest pain, unspecified: Secondary | ICD-10-CM | POA: Diagnosis present

## 2014-10-01 DIAGNOSIS — I82409 Acute embolism and thrombosis of unspecified deep veins of unspecified lower extremity: Secondary | ICD-10-CM

## 2014-10-01 DIAGNOSIS — Z86718 Personal history of other venous thrombosis and embolism: Secondary | ICD-10-CM | POA: Diagnosis not present

## 2014-10-01 DIAGNOSIS — Z915 Personal history of self-harm: Secondary | ICD-10-CM

## 2014-10-01 DIAGNOSIS — R234 Changes in skin texture: Secondary | ICD-10-CM | POA: Diagnosis present

## 2014-10-01 NOTE — ED Notes (Signed)
Pt reports to the ED for eval of intermittent hypertension. She reports something is wrong with her heart and her blood is "not right." she reports she does have a HA and CP. 12 lead done in triage. Also reports bilateral feet swelling and weight loss. These symptoms have been ongoing x 1 month. She reports her psychiatrist told her to come here. Pt reports her medications were changed but she cannot remember how. Pt A&Ox4, resp e/u, and skin warm and dry.

## 2014-10-02 ENCOUNTER — Encounter (HOSPITAL_COMMUNITY): Payer: Self-pay | Admitting: Family Medicine

## 2014-10-02 DIAGNOSIS — D509 Iron deficiency anemia, unspecified: Secondary | ICD-10-CM | POA: Diagnosis not present

## 2014-10-02 DIAGNOSIS — I5030 Unspecified diastolic (congestive) heart failure: Secondary | ICD-10-CM

## 2014-10-02 DIAGNOSIS — R079 Chest pain, unspecified: Secondary | ICD-10-CM | POA: Diagnosis not present

## 2014-10-02 DIAGNOSIS — F319 Bipolar disorder, unspecified: Secondary | ICD-10-CM | POA: Diagnosis present

## 2014-10-02 DIAGNOSIS — R300 Dysuria: Secondary | ICD-10-CM | POA: Diagnosis not present

## 2014-10-02 DIAGNOSIS — I10 Essential (primary) hypertension: Secondary | ICD-10-CM | POA: Diagnosis not present

## 2014-10-02 DIAGNOSIS — R001 Bradycardia, unspecified: Secondary | ICD-10-CM | POA: Diagnosis not present

## 2014-10-02 DIAGNOSIS — F0390 Unspecified dementia without behavioral disturbance: Secondary | ICD-10-CM | POA: Diagnosis present

## 2014-10-02 DIAGNOSIS — Z90710 Acquired absence of both cervix and uterus: Secondary | ICD-10-CM | POA: Diagnosis not present

## 2014-10-02 DIAGNOSIS — F3130 Bipolar disorder, current episode depressed, mild or moderate severity, unspecified: Secondary | ICD-10-CM | POA: Diagnosis not present

## 2014-10-02 DIAGNOSIS — Z880 Allergy status to penicillin: Secondary | ICD-10-CM | POA: Diagnosis not present

## 2014-10-02 DIAGNOSIS — I5032 Chronic diastolic (congestive) heart failure: Secondary | ICD-10-CM | POA: Diagnosis not present

## 2014-10-02 DIAGNOSIS — I4891 Unspecified atrial fibrillation: Secondary | ICD-10-CM | POA: Diagnosis present

## 2014-10-02 DIAGNOSIS — Z915 Personal history of self-harm: Secondary | ICD-10-CM | POA: Diagnosis not present

## 2014-10-02 DIAGNOSIS — I480 Paroxysmal atrial fibrillation: Secondary | ICD-10-CM | POA: Diagnosis not present

## 2014-10-02 DIAGNOSIS — E869 Volume depletion, unspecified: Secondary | ICD-10-CM | POA: Diagnosis not present

## 2014-10-02 DIAGNOSIS — Z9114 Patient's other noncompliance with medication regimen: Secondary | ICD-10-CM | POA: Diagnosis not present

## 2014-10-02 DIAGNOSIS — E876 Hypokalemia: Secondary | ICD-10-CM | POA: Diagnosis not present

## 2014-10-02 DIAGNOSIS — K219 Gastro-esophageal reflux disease without esophagitis: Secondary | ICD-10-CM | POA: Diagnosis not present

## 2014-10-02 DIAGNOSIS — Z888 Allergy status to other drugs, medicaments and biological substances status: Secondary | ICD-10-CM | POA: Diagnosis not present

## 2014-10-02 DIAGNOSIS — Z86718 Personal history of other venous thrombosis and embolism: Secondary | ICD-10-CM | POA: Diagnosis not present

## 2014-10-02 DIAGNOSIS — R234 Changes in skin texture: Secondary | ICD-10-CM | POA: Diagnosis present

## 2014-10-02 DIAGNOSIS — Z882 Allergy status to sulfonamides status: Secondary | ICD-10-CM | POA: Diagnosis not present

## 2014-10-02 DIAGNOSIS — I82409 Acute embolism and thrombosis of unspecified deep veins of unspecified lower extremity: Secondary | ICD-10-CM

## 2014-10-02 DIAGNOSIS — R1310 Dysphagia, unspecified: Secondary | ICD-10-CM | POA: Diagnosis not present

## 2014-10-02 DIAGNOSIS — E010 Iodine-deficiency related diffuse (endemic) goiter: Secondary | ICD-10-CM | POA: Diagnosis not present

## 2014-10-02 DIAGNOSIS — F419 Anxiety disorder, unspecified: Secondary | ICD-10-CM | POA: Diagnosis not present

## 2014-10-02 LAB — CBC
HCT: 39 % (ref 36.0–46.0)
Hemoglobin: 11.9 g/dL — ABNORMAL LOW (ref 12.0–15.0)
MCH: 23 pg — ABNORMAL LOW (ref 26.0–34.0)
MCHC: 30.5 g/dL (ref 30.0–36.0)
MCV: 75.3 fL — AB (ref 78.0–100.0)
PLATELETS: 330 10*3/uL (ref 150–400)
RBC: 5.18 MIL/uL — ABNORMAL HIGH (ref 3.87–5.11)
RDW: 16.4 % — AB (ref 11.5–15.5)
WBC: 8.2 10*3/uL (ref 4.0–10.5)

## 2014-10-02 LAB — BASIC METABOLIC PANEL
Anion gap: 10 (ref 5–15)
BUN: 9 mg/dL (ref 6–20)
CALCIUM: 9.9 mg/dL (ref 8.9–10.3)
CO2: 24 mmol/L (ref 22–32)
Chloride: 104 mmol/L (ref 101–111)
Creatinine, Ser: 0.76 mg/dL (ref 0.44–1.00)
GFR calc Af Amer: >60 mL/min (ref >60)
GFR calc non Af Amer: >60 mL/min (ref >60)
GLUCOSE: 151 mg/dL — AB (ref 65–99)
POTASSIUM: 3.4 mmol/L — AB (ref 3.5–5.1)
Sodium: 138 mmol/L (ref 135–145)

## 2014-10-02 LAB — MRSA PCR SCREENING: MRSA by PCR: NEGATIVE

## 2014-10-02 LAB — TROPONIN I: Troponin I: <0.03 ng/mL (ref <0.031)

## 2014-10-02 LAB — TSH: TSH: 0.511 u[IU]/mL (ref 0.350–4.500)

## 2014-10-02 LAB — I-STAT TROPONIN, ED: Troponin i, poc: 0 ng/mL (ref 0.00–0.08)

## 2014-10-02 LAB — BRAIN NATRIURETIC PEPTIDE
B NATRIURETIC PEPTIDE 5: 52.3 pg/mL (ref 0.0–100.0)
B Natriuretic Peptide: 46.8 pg/mL (ref 0.0–100.0)

## 2014-10-02 LAB — T4, FREE: FREE T4: 1.14 ng/dL — AB (ref 0.61–1.12)

## 2014-10-02 MED ORDER — ACETAMINOPHEN 160 MG/5ML PO SUSP: 500.0000 mg | Freq: Four times a day (QID) | ORAL | Status: DC | PRN

## 2014-10-02 MED ORDER — DILTIAZEM HCL 25 MG/5ML IV SOLN
5.0000 mg | Freq: Once | INTRAVENOUS | Status: AC
  Administered 2014-10-02: 5 mg via INTRAVENOUS
  Filled 2014-10-02: qty 5

## 2014-10-02 MED ORDER — ACETAMINOPHEN 325 MG PO TABS: 650.0000 mg | ORAL_TABLET | ORAL | Status: DC | PRN

## 2014-10-02 MED ORDER — RIVAROXABAN 15 MG PO TABS
15.0000 mg | ORAL_TABLET | Freq: Every day | ORAL | Status: DC
  Administered 2014-10-02 – 2014-10-05 (×4): 15 mg via ORAL
  Filled 2014-10-02 (×4): qty 1

## 2014-10-02 MED ORDER — DILTIAZEM HCL 100 MG IV SOLR
5.0000 mg/h | Freq: Once | INTRAVENOUS | Status: AC
  Administered 2014-10-02: 5 mg/h via INTRAVENOUS

## 2014-10-02 MED ORDER — METOPROLOL TARTRATE 25 MG/10 ML ORAL SUSPENSION
12.5000 mg | Freq: Two times a day (BID) | ORAL | Status: DC
  Filled 2014-10-02 (×3): qty 5

## 2014-10-02 MED ORDER — SODIUM CHLORIDE 0.9 % IV BOLUS (SEPSIS)
500.0000 mL | Freq: Once | INTRAVENOUS | Status: AC
  Administered 2014-10-02: 500 mL via INTRAVENOUS

## 2014-10-02 MED ORDER — SODIUM CHLORIDE 0.9 % IV SOLN
INTRAVENOUS | Status: DC
  Administered 2014-10-03: 07:00:00 via INTRAVENOUS

## 2014-10-02 MED ORDER — ARIPIPRAZOLE 10 MG PO TABS
10.0000 mg | ORAL_TABLET | Freq: Every day | ORAL | Status: DC
  Administered 2014-10-02 – 2014-10-06 (×5): 10 mg via ORAL
  Filled 2014-10-02 (×6): qty 1

## 2014-10-02 MED ORDER — METOPROLOL TARTRATE 25 MG/10 ML ORAL SUSPENSION
25.0000 mg | Freq: Two times a day (BID) | ORAL | Status: DC
  Administered 2014-10-02: 25 mg via ORAL
  Filled 2014-10-02 (×2): qty 10

## 2014-10-02 MED ORDER — ONDANSETRON HCL 4 MG/2ML IJ SOLN: 4.0000 mg | Freq: Four times a day (QID) | INTRAMUSCULAR | Status: DC | PRN

## 2014-10-02 MED ORDER — ENSURE ENLIVE PO LIQD
237.0000 mL | Freq: Three times a day (TID) | ORAL | Status: DC
  Administered 2014-10-02 (×2): 237 mL via ORAL

## 2014-10-02 MED ORDER — DEXTROSE 5 % IV SOLN: 5.0000 mg/h | INTRAVENOUS | Status: DC

## 2014-10-02 MED ORDER — DILTIAZEM HCL 100 MG IV SOLR: 5.0000 mg/h | INTRAVENOUS | Status: DC

## 2014-10-02 MED ORDER — METOPROLOL TARTRATE 25 MG PO TABS
25.0000 mg | ORAL_TABLET | Freq: Two times a day (BID) | ORAL | Status: DC
  Filled 2014-10-02: qty 1

## 2014-10-02 NOTE — Consult Note (Signed)
[place] [facility] Consult   Reason for Consult:  Bipolar disorder and capacity evaluation Referring Physician:  Dr. Verlon Au Patient Identification: Shannon Hurst:  [id number] Principal Diagnosis: Bipolar disorder Diagnosis:   Patient Active Problem List   Diagnosis Date Noted  . Atrial fibrillation with rapid ventricular response [I48.91] 10/02/2014  . Bipolar disorder [F31.9] 10/02/2014  . HTN (hypertension) [I10] 10/02/2014  . Scab [R23.4] 10/02/2014  . (HFpEF) heart failure with preserved ejection fraction [I50.9] 10/02/2014  . Hypertension [I10] 10/02/2014  . DVT (deep venous thrombosis) [I82.409] 10/02/2014  . A-fib [I48.91] 10/02/2014  . GERD (gastroesophageal reflux disease) [K21.9]   . Mood disorder [F39]     Total Time spent with patient: 1 hour  Subjective:   Shannon Hurst is a 74 y.o. female patient admitted with shortness of breath.  HPI:  Shannon Hurst is a 74 years old female admitted to the [facility] with the shortness of breath and questionable compliant with her medication management. Psychiatric consultation requested for capacity evaluation and medication management for bipolar depression. Patient reportedly suffering with multiple episodes of depression and also suicidal attempts. Patient has a recent admission [place] ~ until 09/22/14. 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  Patient has a history of outpatient psychiatric medication management from a psychiatrist and [place]. Patient reportedly was taken Abilify but noncompliant with medication after going home. Patient denies current symptoms of depression, anxiety, psychosis, suicidal/homicidal ideation, intention or plans. Patient seems like having multiple psychosocial stresses especially financial difficulties. There is a questionable memory or cognitive deficits. Patient stated she was not  educated much so she cannot do math or focus much digit span. Patient has intact movement orientation including her name and of the hospital, year month and date. Patient has responded appropriately to most of the questions and has fair language and knowledge. Patient post nasal be taken care by home health aide.  HPI Elements:   Location:  Depression and anxiety. Quality:  Fair. Severity:  Chronic. Timing:  Shortness of breath. Duration:  Few weeks. Context:  Psychosocial stresses especially financial difficulties and no supportive family members.  Past Medical History:  Past Medical History  Diagnosis Date  . GERD (gastroesophageal reflux disease)   . Mood disorder   . Depression   . Difficulty swallowing   . Hypertension   . H/O suicide attempt     attempted to hang self  . DVT (deep venous thrombosis)     Past Surgical History  Procedure Laterality Date  . Hip surgery    . Arm surgery     . Abdominal hysterectomy     Family History:  Family History  Problem Relation Age of Onset  . Heart attack Mother    Social History:  History  Alcohol Use No     History  Drug Use No    History   Social History  . Marital Status: Divorced    Spouse Name: N/A  . Number of Children: N/A  . Years of Education: N/A   Social History Main Topics  . Smoking status: Never Smoker   . Smokeless tobacco: Not on file  . Alcohol Use: No  . Drug Use: No  . Sexual Activity: No   Other Topics Concern  . None   Social History Narrative   Lives in a group home      Amanda Park, case coordinator, [phone]  Tiffany, aid at home      [facility] - for her legs   [facility] Payment yesterday to say her lights were not on (no $ for light bill) and told Kennyth Lose she was going somewhere but would not say where. Called Kennyth Lose today to say she was in the hospital. Per Kennyth Lose, "exaggerates and lies"      Hx of Dementia      Tiffany cooks for her   Needs Help w/ shower/bathroom   No  drive - walker and bus    HCPA herself, no living will   Additional Social History:                          Allergies:   Allergies  Allergen Reactions  . Dalmane [Flurazepam Hcl] Rash  . Penicillins Rash  . Sulfa Antibiotics Rash    Labs:  Results for orders placed or performed during the hospital encounter of 10/01/14 (from the past 48 hour(s))  Brain natriuretic peptide     Status: None   Collection Time: 10/01/14  9:00 PM  Result Value Ref Range   B Natriuretic Peptide 46.8 0.0 - 100.0 pg/mL  I-stat troponin, ED     Status: None   Collection Time: 10/01/14  9:29 PM  Result Value Ref Range   Troponin i, poc 0.00 0.00 - 0.08 ng/mL   Comment 3            Comment: Due to the release kinetics of cTnI, a negative result within the first hours of the onset of symptoms does not rule out myocardial infarction with certainty. If myocardial infarction is still suspected, repeat the test at appropriate intervals.   Basic metabolic panel     Status: Abnormal   Collection Time: 10/01/14  9:35 PM  Result Value Ref Range   Sodium 138 135 - 145 mmol/L   Potassium 3.4 (L) 3.5 - 5.1 mmol/L   Chloride 104 101 - 111 mmol/L   CO2 24 22 - 32 mmol/L   Glucose, Bld 151 (H) 65 - 99 mg/dL   BUN 9 6 - 20 mg/dL   Creatinine, Ser 0.76 0.44 - 1.00 mg/dL   Calcium 9.9 8.9 - 10.3 mg/dL   GFR calc non Af Amer >60 >60 mL/min   GFR calc Af Amer >60 >60 mL/min    Comment: (NOTE) The eGFR has been calculated using the CKD EPI equation. This calculation has not been validated in all clinical situations. eGFR's persistently <60 mL/min signify possible Chronic Kidney Disease.    Anion gap 10 5 - 15  CBC     Status: Abnormal   Collection Time: 10/01/14  9:35 PM  Result Value Ref Range   WBC 8.2 4.0 - 10.5 K/uL   RBC 5.18 (H) 3.87 - 5.11 MIL/uL   Hemoglobin 11.9 (L) 12.0 - 15.0 g/dL   HCT 39.0 36.0 - 46.0 %   MCV 75.3 (L) 78.0 - 100.0 fL   MCH 23.0 (L) 26.0 - 34.0 pg   MCHC 30.5 30.0  - 36.0 g/dL   RDW 16.4 (H) 11.5 - 15.5 %   Platelets 330 150 - 400 K/uL  MRSA PCR Screening     Status: None   Collection Time: 10/02/14  7:10 AM  Result Value Ref Range   MRSA by PCR NEGATIVE NEGATIVE    Comment:        The GeneXpert MRSA Assay (FDA approved for NASAL specimens only), is one  component of a comprehensive MRSA colonization surveillance program. It is not intended to diagnose MRSA infection nor to guide or monitor treatment for MRSA infections.   Brain natriuretic peptide     Status: None   Collection Time: 10/02/14  9:05 AM  Result Value Ref Range   B Natriuretic Peptide 52.3 0.0 - 100.0 pg/mL  Troponin I     Status: None   Collection Time: 10/02/14  9:05 AM  Result Value Ref Range   Troponin I <0.03 <0.031 ng/mL    Comment:        NO INDICATION OF MYOCARDIAL INJURY.   TSH     Status: None   Collection Time: 10/02/14  9:05 AM  Result Value Ref Range   TSH 0.511 0.350 - 4.500 uIU/mL  T4, free     Status: Abnormal   Collection Time: 10/02/14  9:05 AM  Result Value Ref Range   Free T4 1.14 (H) 0.61 - 1.12 ng/dL    Vitals: Blood pressure 72/43, pulse 46, temperature 98.7 F (37.1 C), temperature source Oral, resp. rate 18, height 4' 11"  (1.499 m), weight 46.1 kg (101 lb 10.1 oz), SpO2 96 %.  Risk to Self: Is patient at risk for suicide?: No Risk to Others:   Prior Inpatient Therapy:   Prior Outpatient Therapy:    Current Facility-Administered Medications  Medication Dose Route Frequency Provider Last Rate Last Dose  . acetaminophen (TYLENOL) tablet 650 mg  650 mg Oral Q4H PRN Nita Sells, MD      . ARIPiprazole (ABILIFY) tablet 10 mg  10 mg Oral Daily Nita Sells, MD   10 mg at 10/02/14 1116  . diltiazem (CARDIZEM) 100 mg in dextrose 5 % 100 mL (1 mg/mL) infusion  5-15 mg/hr Intravenous Titrated Nita Sells, MD   Stopped at 10/02/14 1135  . feeding supplement (ENSURE ENLIVE) (ENSURE ENLIVE) liquid 237 mL  237 mL Oral TID BM  Nita Sells, MD   237 mL at 10/02/14 1116  . metoprolol tartrate (LOPRESSOR) 25 mg/10 mL oral suspension 12.5 mg  12.5 mg Oral BID Nita Sells, MD      . ondansetron (ZOFRAN) injection 4 mg  4 mg Intravenous Q6H PRN Nita Sells, MD      . Rivaroxaban (XARELTO) tablet 15 mg  15 mg Oral Q supper Nita Sells, MD      . sodium chloride 0.9 % bolus 500 mL  500 mL Intravenous Once Nita Sells, MD        Musculoskeletal: Strength & Muscle Tone: decreased Gait & Station: unable to stand Patient leans: N/A  Psychiatric Specialty Exam: Physical Exam as per history and physical   ROS denied Nausea,  chills,  fever,  cough, + headache, difficulty swallowing occasionally. No dysuria, no sputum. No shortness of breath No Fever-chills, No Headache, No changes with Vision or hearing, reports vertigo No problems swallowing food or Liquids, No Chest pain, Cough or Shortness of Breath, No Abdominal pain, No Nausea or Vommitting, Bowel movements are regular, No Blood in stool or Urine, No dysuria, No new skin rashes or bruises, No new joints pains-aches,  No new weakness, tingling, numbness in any extremity, No recent weight gain or loss, No polyuria, polydypsia or polyphagia,   A full 10 point Review of Systems was done, except as stated above, all other Review of Systems were negative.  Blood pressure 72/43, pulse 46, temperature 98.7 F (37.1 C), temperature source Oral, resp. rate 18, height 4' 11"  (1.499 m), weight 46.1 kg (  101 lb 10.1 oz), SpO2 96 %.Body mass index is 20.52 kg/(m^2).  General Appearance: Casual  Eye Contact::  Good  Speech:  Slow  Volume:  Decreased  Mood:  Anxious and Depressed  Affect:  Appropriate and Congruent  Thought Process:  Coherent and Goal Directed  Orientation:  Full (Time, Place, and Person)  Thought Content:  WDL  Suicidal Thoughts:  No  Homicidal Thoughts:  No  Memory:  Immediate;   Fair Recent;   Fair  Judgement:   Fair  Insight:  Fair  Psychomotor Activity:  Decreased  Concentration:  Good  Recall:  Good  Fund of Knowledge:Good  Language: Good  Akathisia:  Negative  Handed:  Right  AIMS (if indicated):     Assets:  Communication Skills Desire for Improvement Financial Resources/Insurance Housing Intimacy Leisure Time Resilience Social Support  ADL's:  Impaired  Cognition: WNL  Sleep:      Medical Decision Making: Review of Psycho-Social Stressors (1), Review or order clinical lab tests (1), Established Problem, Worsening (2), Review of Last Therapy Session (1), Review of Medication Regimen & Side Effects (2) and Review of New Medication or Change in Dosage (2)  Treatment Plan Summary: Patient has been suffering with depression, anxiety and has been noncompliant with her medication management as his at home. Patient is willing to take her medication well in the hospital. Daily contact with patient to assess and evaluate symptoms and progress in treatment and Medication management  Plan:  Agree with restarting her medication Abilify 10 mg daily which can be titrated to 15 mg if needed Patient does not meet criteria for psychiatric inpatient admission. Supportive therapy provided about ongoing stressors.  Appreciate psychiatric consultation and follow up as clinically required Please contact [phone] or [phone] if needs further assistance  Disposition: Patient will be referred to the psychiatric outpatient medication management when medically stable.  Magdiel Bartles,JANARDHAHA R. 10/02/2014 1:26 PM

## 2014-10-02 NOTE — ED Provider Notes (Signed)
CSN: [id number]     Arrival date & time 10/01/14  2040 History  This chart was scribed for Devoria Albe, MD by Evon Slack, ED Scribe. This patient was seen in room A11C/A11C and the patient's care was started at 11:56 AM.    Chief Complaint  Patient presents with  . Hypertension   Patient is a 74 y.o. female presenting with hypertension. The history is provided by the patient. No language interpreter was used.  Hypertension Associated symptoms include chest pain.   HPI Comments: Shannon Hurst is a 74 y.o. female with PMHX of GERD, mood disorder, depression, difficulty swallowing, Hx of suicide attempt and DVT who presents to the Emergency Department complaining of worsening generalized edema for the past 10 days. She states that " I almost had a heart attack at [facility]" and every since then her symptoms have been constant. She states that she had received several prescriptions from [facility] when she was discharged 10 days ago but didn't get them filled. She reports constant CP and cough for the past 10 days well. She reports losing about 25 lbs in the past 10 days as well because she is unable to swallow.  She states that she has not been eating due to having trouble swallowing. She also reports dysuria and decreased urine 10 days ago. She states that she is also having some abdominal pain. She states that she decided to come tonight because she just wasn't feeling well. Denies fever, nausea, vomiting, diarrhea or urinary frequency. Pt does report that she lives alone. She denies tobacco or alcohol use. She also states "my whole body is swelling the past 10 days". She states she has had constant central chest pain for the past 10 days. She describes a dry cough without fever.  PCP none Psych Dr Allena Katz  Past Medical History  Diagnosis Date  . GERD (gastroesophageal reflux disease)   . Mood disorder   . Depression   . Difficulty swallowing   . Hypertension   . H/O  suicide attempt     attempted to hang self  . DVT (deep venous thrombosis)    Past Surgical History  Procedure Laterality Date  . Hip surgery    . Arm surgery     . Abdominal hysterectomy     No family history on file. History  Substance Use Topics  . Smoking status: Never Smoker   . Smokeless tobacco: Not on file  . Alcohol Use: No   Lives alone Uses a walker  OB History    No data available     Review of Systems  Constitutional: Negative for fever.  Respiratory: Positive for cough.   Cardiovascular: Positive for chest pain and leg swelling.  Gastrointestinal: Negative for nausea, vomiting and diarrhea.  Genitourinary: Positive for dysuria and decreased urine volume.  All other systems reviewed and are negative.   Allergies  Dalmane; Penicillins; and Sulfa antibiotics  Home Medications   Prior to Admission medications   Medication Sig Start Date End Date Taking? Authorizing Provider  acetaminophen (TYLENOL) 160 MG/5ML suspension Take 480 mg by mouth 2 (two) times daily as needed. For pain.   Yes Historical Provider, MD  Ensure Plus (ENSURE PLUS) LIQD Take 237 mLs by mouth 3 (three) times daily between meals.   Yes Historical Provider, MD   BP 137/82 mmHg  Pulse 99  Temp(Src) 98.2 F (36.8 C) (Oral)  Resp 24  Wt 100 lb 3.2 oz (45.45 kg)  SpO2 98%  Vital signs normal     Physical Exam  Constitutional: She is oriented to person, place, and time. She appears well-developed and well-nourished.  Non-toxic appearance. She does not appear ill. No distress.  Mentally slow.  HENT:  Head: Normocephalic and atraumatic.  Right Ear: External ear normal.  Left Ear: External ear normal.  Nose: Nose normal. No mucosal edema or rhinorrhea.  Mouth/Throat: Oropharynx is clear and moist and mucous membranes are normal. No dental abscesses or uvula swelling.  Eyes: Conjunctivae and EOM are normal. Pupils are equal, round, and reactive to light.  Neck: Normal range of motion  and full passive range of motion without pain. Neck supple.  Cardiovascular: Normal rate, regular rhythm and normal heart sounds.  Exam reveals no gallop and no friction rub.   No murmur heard. Pulmonary/Chest: Effort normal and breath sounds normal. No respiratory distress. She has no wheezes. She has no rhonchi. She has no rales. She exhibits no tenderness and no crepitus.  Abdominal: Soft. Normal appearance and bowel sounds are normal. She exhibits no distension. There is no tenderness. There is no rebound and no guarding.  Musculoskeletal: Normal range of motion. She exhibits no edema or tenderness.  Moves all extremities well. Patient has no pitting edema. Please see photo  Neurological: She is alert and oriented to person, place, and time. She has normal strength. No cranial nerve deficit.  Skin: Skin is warm, dry and intact. No rash noted. No erythema. No pallor.  Psychiatric: She has a normal mood and affect. Her speech is normal and behavior is normal. Her mood appears not anxious.  Nursing note and vitals reviewed.   As seen in this picture patient has no edema.      ED Course  Procedures (including critical care time)  Medications  diltiazem (CARDIZEM) 100 mg in dextrose 5 % 100 mL (1 mg/mL) infusion (7.5 mg/hr Intravenous Rate/Dose Change 10/02/14 0601)  diltiazem (CARDIZEM) injection 5 mg (5 mg Intravenous Given 10/02/14 0524)    DIAGNOSTIC STUDIES: Oxygen Saturation is 98% on RA, normal by my interpretation.    COORDINATION OF CARE: 12:25 AM-Discussed treatment plan with pt at bedside and pt agreed to plan.   [facility]. However they also started her on Eliquis and metoprolol  The nurse gave patient something to drink and she drank it without difficulty. I had ordered a barium swallow however it is unavailable at night time unless there is a foreign body suspected.  About 5 AM patient  walked to the bathroom. She was noted to have a fast heart rate by nursing staff. She was placed on a monitor and EKG was done which showed atrial fibrillation with fast ventricular response. Her heart rate was 144 on her EKG. Patient was started on a Cardizem bolus and drip.  06:00 Dr Clyde LundborgNiu, hospitalist, admit to step-down  Labs Review BASIC metabolic panel normal except Potassium slightly low 3.4, glucose 151  CBC normal except for hemoglobin 11.9 which is mildly low, MCV low at 75.3, MCH low at 23.0 and RDW high at 16.4  Troponin is 0  Imaging Review Dg Chest 2 View  10/01/2014   CLINICAL DATA:  One day history of chest pain  EXAM: CHEST  2 VIEW  COMPARISON:  July 02, 2014  FINDINGS: There is no edema or consolidation. The heart size and pulmonary vascularity are normal. No adenopathy. There is a sizable hiatal type hernia with much of the  stomach above the diaphragm. There is degenerative change in the thoracic spine.  IMPRESSION: Sizable hiatal type hernia.  No lung edema or consolidation.   Electronically Signed   By: Bretta Bang III M.D.   On: 10/01/2014 21:38     EKG Interpretation   Date/Time:  Thursday October 01 2014 20:59:53 EDT Ventricular Rate:  94 PR Interval:  114 QRS Duration: 70 QT Interval:  346 QTC Calculation: 432 R Axis:   32 Text Interpretation:  Normal sinus rhythm Right atrial enlargement No  significant change since last tracing 02 Jul 2014 Confirmed by Nayellie Sanseverino   MD-I, Janaki Exley ([id number]) on 10/02/2014 12:01:01 AM    EKG Interpretation  Date/Time:  Friday October 02 2014 05:08:05 EDT Ventricular Rate:  144 PR Interval:  114 QRS Duration: 103 QT Interval:  306 QTC Calculation: 474 R Axis:   51 Text Interpretation:  Atrial fibrillation with rapid V-rate Ventricular premature complex Repolarization abnormality, prob rate related Since last tracing of earlier today Atrial fibrillation has replaced Normal sinus rhythm Confirmed by Cuauhtemoc Huegel  MD-I, Kendell Sagraves ([id number]) on 10/02/2014  5:41:08 AM         MDM   Final diagnoses:  Atrial fibrillation with rapid ventricular response   Plan admission  CRITICAL CARE Performed by: Devoria Albe L Total critical care time: 32 min Critical care time was exclusive of separately billable procedures and treating other patients. Critical care was necessary to treat or prevent imminent or life-threatening deterioration. Critical care was time spent personally by me on the following activities: development of treatment plan with patient and/or surrogate as well as nursing, discussions with consultants, evaluation of patient's response to treatment, examination of patient, obtaining history from patient or surrogate, ordering and performing treatments and interventions, ordering and review of laboratory studies, ordering and review of radiographic studies, pulse oximetry and re-evaluation of patient's condition.     I personally performed the services described in this documentation, which was scribed in my presence. The recorded information has been reviewed and considered.   Devoria Albe, MD, Concha Pyo, MD 10/02/14 [phone]

## 2014-10-02 NOTE — Progress Notes (Signed)
Dr. Mahala MenghiniSamtani notified that pt is now in sinus rhythm and cardizem drip has been turned off for bradycardic events. Orders received.  Will continue to monitor pt closely.

## 2014-10-02 NOTE — ED Notes (Signed)
Pt escorted to bathroom. Upon returning to room, pt HR jumped from 170s to 120s.  Monitor showed A-Fib, MD notified. Pt A&O x 4, no changes in mental status.
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
## 2014-10-02 NOTE — Progress Notes (Signed)
Dr. Mahala MenghiniSamtani notified of decrease in blood pressure after metoprolol dose .  Pt asymptomatic with low blood pressure.  Orders received.   Will continue to monitor pt closely.

## 2014-10-02 NOTE — Progress Notes (Signed)
Initial Nutrition Assessment  DOCUMENTATION CODES:   Not applicable  INTERVENTION:   Ensure Enlive po TID, each supplement provides 350 kcal and 20 grams of protein  NUTRITION DIAGNOSIS:   Predicted suboptimal nutrient intake related to acute illness as evidenced by per patient/family report.  GOAL:   Patient will meet greater than or equal to 90% of their needs  MONITOR:   PO intake, Supplement acceptance, Labs, Weight trends, Skin, I & O's  REASON FOR ASSESSMENT:   Malnutrition Screening Tool    ASSESSMENT:   74 y/o ? recent admission [facility] ~ until 09/22/14-Rx there allegedly for Depression-has prior attempts at Suicide, dysphagia prior DVT--also subj c/o LE edema which is what brought her to ED.\  Pt admitted with a-fib.   Spoke with pt at bedside, who reports she has a good appetite. She is very adamant about receiving "regular food". Noted that pt with several missing teeth, however, pt denies any difficulty chewing or swallowing despite this. Per chart review, pt has an aide who assists with food preparation. When asked about what foods her aide prepares for her at home, she sharply reported "I do not wish to discuss her". Per RN, pt ate all of her breakfast this morning. RN also reports there has been a lot of conflict between pt and caregivers, per home caseworker report.   Pt reports UBW of 130#, however, this is not consistent with wt hx. Noted UBE around 101#. Wt of 106# verified by bedscale.   Pt reports she consumes Ensure three times daily; enjoys the strawberry and vanilla flavors. She requests to continue order in the hospital.   Nutrition-Focused physical exam completed. Findings are mild fat depletion, mild muscle depletion, and mild edema. RD suspects that muscle depletion is likely related to decreased mobility and older age.   Labs reviewed: K: 3.4.   Diet Order:  Diet Heart Room service appropriate?: Yes; Fluid consistency:: Thin  Skin:  Reviewed, no  issues  Last BM:  PTA  Height:   Ht Readings from Last 1 Encounters:  10/02/14 4\' 11"  (1.499 m)    Weight:   Wt Readings from Last 1 Encounters:  10/02/14 101 lb 10.1 oz (46.1 kg)    Ideal Body Weight:  44.5 kg  Wt Readings from Last 10 Encounters:  10/02/14 101 lb 10.1 oz (46.1 kg)  07/02/14 102 lb (46.267 kg)  03/28/11 90 lb (40.824 kg)    BMI:  Body mass index is 20.52 kg/(m^2).  Estimated Nutritional Needs:   Kcal:  1200-1400  Protein:  50-60 grams  Fluid:  1.2-1.4 L  EDUCATION NEEDS:   Education needs addressed  Edoardo Laforte A. Mayford KnifeWilliams, RD, LDN, CDE Pager: [phone] After hours Pager: [phone]

## 2014-10-02 NOTE — Care Management Note (Signed)
Case Management Note  Patient Details  Name: Shannon Hurst MRN: [id number] Date of Birth: 1941/01/05  Subjective/Objective:        Adm w at fib            Action/Plan: lives at home   Expected Discharge Date:                  Expected Discharge Plan:     In-House Referral:     Discharge planning Services     Post Acute Care Choice:    Choice offered to:     DME Arranged:    DME Agency:     HH Arranged:    HH Agency:     Status of Service:     Medicare Important Message Given:    Date Medicare IM Given:    Medicare IM give by:    Date Additional Medicare IM Given:    Additional Medicare Important Message give by:     If discussed at Long Length of Stay Meetings, dates discussed:    Additional Comments: ur review done  Hanley HaysDowell, [name], RN 10/02/2014, 7:52 AM

## 2014-10-02 NOTE — ED Notes (Signed)
Paper copy of lab results given to Dr. Lynelle DoctorKnapp.

## 2014-10-03 DIAGNOSIS — R001 Bradycardia, unspecified: Secondary | ICD-10-CM | POA: Diagnosis not present

## 2014-10-03 DIAGNOSIS — F0390 Unspecified dementia without behavioral disturbance: Secondary | ICD-10-CM | POA: Diagnosis present

## 2014-10-03 DIAGNOSIS — F419 Anxiety disorder, unspecified: Secondary | ICD-10-CM | POA: Diagnosis not present

## 2014-10-03 DIAGNOSIS — I82409 Acute embolism and thrombosis of unspecified deep veins of unspecified lower extremity: Secondary | ICD-10-CM | POA: Diagnosis not present

## 2014-10-03 DIAGNOSIS — E869 Volume depletion, unspecified: Secondary | ICD-10-CM | POA: Diagnosis not present

## 2014-10-03 DIAGNOSIS — F319 Bipolar disorder, unspecified: Secondary | ICD-10-CM | POA: Diagnosis not present

## 2014-10-03 DIAGNOSIS — Z915 Personal history of self-harm: Secondary | ICD-10-CM | POA: Diagnosis not present

## 2014-10-03 DIAGNOSIS — I509 Heart failure, unspecified: Secondary | ICD-10-CM | POA: Diagnosis not present

## 2014-10-03 DIAGNOSIS — R1310 Dysphagia, unspecified: Secondary | ICD-10-CM | POA: Diagnosis not present

## 2014-10-03 DIAGNOSIS — F3130 Bipolar disorder, current episode depressed, mild or moderate severity, unspecified: Secondary | ICD-10-CM | POA: Diagnosis not present

## 2014-10-03 DIAGNOSIS — Z86718 Personal history of other venous thrombosis and embolism: Secondary | ICD-10-CM | POA: Diagnosis not present

## 2014-10-03 DIAGNOSIS — R079 Chest pain, unspecified: Secondary | ICD-10-CM | POA: Diagnosis not present

## 2014-10-03 DIAGNOSIS — I4891 Unspecified atrial fibrillation: Secondary | ICD-10-CM | POA: Diagnosis not present

## 2014-10-03 DIAGNOSIS — D509 Iron deficiency anemia, unspecified: Secondary | ICD-10-CM | POA: Diagnosis not present

## 2014-10-03 DIAGNOSIS — I5032 Chronic diastolic (congestive) heart failure: Secondary | ICD-10-CM | POA: Diagnosis not present

## 2014-10-03 DIAGNOSIS — I10 Essential (primary) hypertension: Secondary | ICD-10-CM | POA: Diagnosis not present

## 2014-10-03 DIAGNOSIS — I480 Paroxysmal atrial fibrillation: Secondary | ICD-10-CM | POA: Diagnosis not present

## 2014-10-03 DIAGNOSIS — E010 Iodine-deficiency related diffuse (endemic) goiter: Secondary | ICD-10-CM | POA: Diagnosis not present

## 2014-10-03 DIAGNOSIS — Z888 Allergy status to other drugs, medicaments and biological substances status: Secondary | ICD-10-CM | POA: Diagnosis not present

## 2014-10-03 DIAGNOSIS — Z880 Allergy status to penicillin: Secondary | ICD-10-CM | POA: Diagnosis not present

## 2014-10-03 DIAGNOSIS — E876 Hypokalemia: Secondary | ICD-10-CM | POA: Diagnosis not present

## 2014-10-03 DIAGNOSIS — K219 Gastro-esophageal reflux disease without esophagitis: Secondary | ICD-10-CM | POA: Diagnosis not present

## 2014-10-03 DIAGNOSIS — Z90710 Acquired absence of both cervix and uterus: Secondary | ICD-10-CM | POA: Diagnosis not present

## 2014-10-03 DIAGNOSIS — Z9114 Patient's other noncompliance with medication regimen: Secondary | ICD-10-CM | POA: Diagnosis not present

## 2014-10-03 DIAGNOSIS — Z882 Allergy status to sulfonamides status: Secondary | ICD-10-CM | POA: Diagnosis not present

## 2014-10-03 DIAGNOSIS — R300 Dysuria: Secondary | ICD-10-CM | POA: Diagnosis not present

## 2014-10-03 MED ORDER — SODIUM CHLORIDE 0.9 % IV BOLUS (SEPSIS)
250.0000 mL | Freq: Once | INTRAVENOUS | Status: AC
  Administered 2014-10-03: 250 mL via INTRAVENOUS

## 2014-10-03 MED ORDER — DILTIAZEM HCL 30 MG PO TABS
30.0000 mg | ORAL_TABLET | Freq: Three times a day (TID) | ORAL | Status: DC
  Administered 2014-10-03: 30 mg via ORAL
  Filled 2014-10-03 (×3): qty 1

## 2014-10-03 MED ORDER — POTASSIUM CHLORIDE CRYS ER 20 MEQ PO TBCR
40.0000 meq | EXTENDED_RELEASE_TABLET | Freq: Two times a day (BID) | ORAL | Status: AC
  Administered 2014-10-03 – 2014-10-04 (×3): 40 meq via ORAL
  Filled 2014-10-03 (×4): qty 2

## 2014-10-03 NOTE — Progress Notes (Addendum)
[place] TEAM 1 - Stepdown/ICU TEAM Progress Note  Rosebud PolesGoldie Adams Aguiniga ZOX:[id number] DOB: [birth year] DOA: 10/01/2014 PCP: No primary care provider on file.  Admit HPI / Brief Narrative: 74 y/o ? with a [facility] until 09/22/14 for depression w/ a prior hx of multiple suicide attempts, dysphagia, A fib w/ RVR, and DVT who [facility] c/o "worsening generalized edema."    HPI/Subjective: The patient appears comfortable at the time of my visit.  She displays pressured speech.  Her primary goals are to be able to eat and to get home by Monday.  She denies chest pain fevers chills nausea vomiting or abdominal pain but the review of systems is quite difficult as she continues to steer the conversation to the 2 issues I mentioned.  Assessment/Plan:  Parox Afib w/ RVR Converted to NSR spontaneously - adjust rate controlling meds as the pt is mildy bradycardic and hypotensive when in NSR - cont Xarelto as CHA2DS2 - VASc score is at least 4  Mild hypokalemia  Replace with goal of 4.0 - follow  Microcytic anemia  Check anemia panel - no gross evidence of blood loss  Bipolar D/O To resume Abilify per Psych   HTN Not an active problem at this time - follow with adjustment in A. fib medications  Chronic diastolic CHF  Appears if anything to be mildly volume depleted at present - follow weights and I's and O's - baseline wgt appears to be ~46kg as of April 2016 - presently 46.1kg  Hx of DVT Continue Xarelto  Code Status: FULL Family Communication: no family present at time of exam Disposition Plan: Transfer to telemetry bed - PT/OT  Consultants: none  Procedures: none  Antibiotics: none  DVT prophylaxis: Xarelto  Objective: Blood pressure 97/33, pulse 67, temperature 98.2 F (36.8 C), temperature source Oral, resp. rate 22, height 4\' 11"  (1.499 m), weight 46.1 kg (101 lb 10.1 oz), SpO2 96 %.  Intake/Output Summary (Last 24 hours) at 10/03/14  1433 Last data filed at 10/03/14 1300  Gross per 24 hour  Intake 767.67 ml  Output    775 ml  Net  -7.33 ml    Exam: General: No acute respiratory distress Lungs: Clear to auscultation bilaterally without wheezes or crackles Cardiovascular: Bradycardic at 50 bpm but sinus without appreciable murmur gallop or rub Abdomen: Nontender, nondistended, soft, bowel sounds positive, no rebound, no ascites, no appreciable mass Extremities: No significant cyanosis, clubbing, or edema bilateral lower extremities  Data Reviewed: Basic Metabolic Panel:  Recent Labs Lab 10/01/14 2135  NA 138  K 3.4*  CL 104  CO2 24  GLUCOSE 151*  BUN 9  CREATININE 0.76  CALCIUM 9.9    CBC:  Recent Labs Lab 10/01/14 2135  WBC 8.2  HGB 11.9*  HCT 39.0  MCV 75.3*  PLT 330    Liver Function Tests: No results for input(s): AST, ALT, ALKPHOS, BILITOT, PROT, ALBUMIN in the last 168 hours. No results for input(s): LIPASE, AMYLASE in the last 168 hours. No results for input(s): AMMONIA in the last 168 hours.  Cardiac Enzymes:  Recent Labs Lab 10/02/14 0905  TROPONINI <0.03    Recent Results (from the past 240 hour(s))  MRSA PCR Screening     Status: None   Collection Time: 10/02/14  7:10 AM  Result Value Ref Range Status   MRSA by PCR NEGATIVE NEGATIVE Final    Comment:        The GeneXpert MRSA  Assay (FDA approved for NASAL specimens only), is one component of a comprehensive MRSA colonization surveillance program. It is not intended to diagnose MRSA infection nor to guide or monitor treatment for MRSA infections.      Studies:   Recent x-ray studies have been reviewed in detail by the Attending Physician  Scheduled Meds:  Scheduled Meds: . ARIPiprazole  10 mg Oral Daily  . feeding supplement (ENSURE ENLIVE)  237 mL Oral TID BM  . metoprolol tartrate  12.5 mg Oral BID  . rivaroxaban  15 mg Oral Q supper    Time spent on care of this patient: 35 mins   Gerrianne Aydelott  T , MD   [facility] Office  [phone] Pager - Text Page per Loretha Stapler as per below:  On-Call/Text Page:      [email or web address]      password [email or web address]  If 7PM-7AM, please contact night-coverage [email or web address] Password [email or web address] 10/03/2014, 2:33 PM   LOS: 1 day

## 2014-10-03 NOTE — Progress Notes (Signed)
Pt with 150cc uo during the night. Pt refuses bedpan/attempt to void because "she doesn't have to go right now" and she "normally doesn't go much at night. Will contact NP on call about possible need for a bolus. Will continue to monitor

## 2014-10-04 DIAGNOSIS — I82409 Acute embolism and thrombosis of unspecified deep veins of unspecified lower extremity: Secondary | ICD-10-CM | POA: Diagnosis not present

## 2014-10-04 DIAGNOSIS — I10 Essential (primary) hypertension: Secondary | ICD-10-CM

## 2014-10-04 DIAGNOSIS — I509 Heart failure, unspecified: Secondary | ICD-10-CM

## 2014-10-04 DIAGNOSIS — I4891 Unspecified atrial fibrillation: Secondary | ICD-10-CM | POA: Diagnosis not present

## 2014-10-04 DIAGNOSIS — F3130 Bipolar disorder, current episode depressed, mild or moderate severity, unspecified: Secondary | ICD-10-CM | POA: Diagnosis not present

## 2014-10-04 DIAGNOSIS — I480 Paroxysmal atrial fibrillation: Principal | ICD-10-CM

## 2014-10-04 LAB — CBC
HEMATOCRIT: 34.8 % — AB (ref 36.0–46.0)
HEMOGLOBIN: 10.6 g/dL — AB (ref 12.0–15.0)
MCH: 23.7 pg — ABNORMAL LOW (ref 26.0–34.0)
MCHC: 30.5 g/dL (ref 30.0–36.0)
MCV: 77.7 fL — ABNORMAL LOW (ref 78.0–100.0)
Platelets: 285 10*3/uL (ref 150–400)
RBC: 4.48 MIL/uL (ref 3.87–5.11)
RDW: 16.3 % — ABNORMAL HIGH (ref 11.5–15.5)
WBC: 5.1 10*3/uL (ref 4.0–10.5)

## 2014-10-04 LAB — COMPREHENSIVE METABOLIC PANEL
ALBUMIN: 2.8 g/dL — AB (ref 3.5–5.0)
ALK PHOS: 78 U/L (ref 38–126)
ALT: 10 U/L — ABNORMAL LOW (ref 14–54)
AST: 14 U/L — ABNORMAL LOW (ref 15–41)
Anion gap: 5 (ref 5–15)
BUN: 6 mg/dL (ref 6–20)
CO2: 25 mmol/L (ref 22–32)
CREATININE: 0.6 mg/dL (ref 0.44–1.00)
Calcium: 9.4 mg/dL (ref 8.9–10.3)
Chloride: 108 mmol/L (ref 101–111)
GFR calc Af Amer: >60 mL/min (ref >60)
GFR calc non Af Amer: >60 mL/min (ref >60)
GLUCOSE: 88 mg/dL (ref 65–99)
Potassium: 4.4 mmol/L (ref 3.5–5.1)
Sodium: 138 mmol/L (ref 135–145)
TOTAL PROTEIN: 5.7 g/dL — AB (ref 6.5–8.1)
Total Bilirubin: 0.8 mg/dL (ref 0.3–1.2)

## 2014-10-04 MED ORDER — DILTIAZEM HCL ER COATED BEADS 120 MG PO CP24: 120.0000 mg | ORAL_CAPSULE | Freq: Every day | ORAL | Status: DC

## 2014-10-04 MED ORDER — DILTIAZEM HCL 30 MG PO TABS
30.0000 mg | ORAL_TABLET | Freq: Three times a day (TID) | ORAL | Status: DC
  Administered 2014-10-04 – 2014-10-05 (×2): 30 mg via ORAL
  Filled 2014-10-04 (×2): qty 1

## 2014-10-04 NOTE — Evaluation (Signed)
Physical Therapy Evaluation Patient Details Name: Shannon Hurst Bugarin MRN: [id number] DOB: 10-Oct-1940 Today's Date: 10/04/2014   History of Present Illness  Shannon Hurst [place] is a 74 y.o. female with PMHX of GERD, mood disorder, depression, difficulty swallowing, Hx of suicide attempt and DVT. She presented to the ED 10-01-14 with c/o LE edema. She was admitted with dx of bipolar disorder.  Clinical Impression  Pt is at baseline level of function. No further PT intervention indicated. PT signing off.    Follow Up Recommendations Supervision - Intermittent    Equipment Recommendations  None recommended by PT    Recommendations for Other Services       Precautions / Restrictions Precautions Precautions: None      Mobility  Bed Mobility Overal bed mobility: Modified Independent                Transfers Overall transfer level: Modified independent Equipment used: 4-wheeled walker                Ambulation/Gait Ambulation/Gait assistance: Supervision Ambulation Distance (Feet): 200 Feet Assistive device: 4-wheeled walker Gait Pattern/deviations: Step-through pattern;Decreased stride length Gait velocity: WFL      Stairs            Wheelchair Mobility    Modified Rankin (Stroke Patients Only)       Balance                                             Pertinent Vitals/Pain Pain Assessment: No/denies pain    Home Living Family/patient expects to be discharged to:: Private residence Living Arrangements: Alone Available Help at Discharge: Personal care attendant;Available PRN/intermittently Type of Home: House Home Access: Level entry     Home Layout: One level Home Equipment: Walker - 4 wheels      Prior Function Level of Independence: Independent with assistive device(s)               Hand Dominance        Extremity/Trunk Assessment   Upper Extremity Assessment: Overall WFL for tasks assessed           Lower Extremity Assessment: Overall WFL for tasks assessed      Cervical / Trunk Assessment: Kyphotic  Communication   Communication: No difficulties  Cognition Arousal/Alertness: Awake/alert Behavior During Therapy: WFL for tasks assessed/performed Overall Cognitive Status: History of cognitive impairments - at baseline                      General Comments      Exercises        Assessment/Plan    PT Assessment Patent does not need any further PT services  PT Diagnosis Difficulty walking   PT Problem List    PT Treatment Interventions     PT Goals (Current goals can be found in the Care Plan section) Acute Rehab PT Goals Patient Stated Goal: home PT Goal Formulation: All assessment and education complete, DC therapy    Frequency     Barriers to discharge        Co-evaluation               End of Session Equipment Utilized During Treatment: Gait belt Activity Tolerance: Patient tolerated treatment well Patient left: in bed;with call bell/phone within reach;with bed alarm set Nurse Communication: Mobility status  Time: 1914-7829 PT Time Calculation (min) (ACUTE ONLY): 11 min   Charges:   PT Evaluation $Initial PT Evaluation Tier I: 1 Procedure     PT G Codes:        Ilda Foil 10/04/2014, 1:56 PM

## 2014-10-04 NOTE — Clinical Social Work Note (Signed)
CSW met with patient at bedside to complete assessment. Patient denies being from a group home. She states that she has her own apartment at [facility] on [address]. Patient became very upset with CSW when CSW explained that patient could DC today. She stated she would not be leaving the hospital until Monday. CSW has updated charge Theatre stage manager. CSW attempted to reach [facility] [place], but voicemail is full. CSW signing off at this time.   Liz Beach MSW, [place], [place], [phone]

## 2014-10-04 NOTE — Progress Notes (Signed)
[facility]                                                                              Patient Demographics  Shannon Hurst, is a 74 y.o. female, DOB - 1940-03-23, ZOX:[id number]  Admit date - 10/01/2014   Admitting Physician Lorretta Harp, MD  Outpatient Primary MD for the patient is No primary care provider on file.  LOS - 2   Chief Complaint  Patient presents with  . Hypertension       Brief HPI  74 y/o female with a recent admission to [facility] ~ until 09/22/14 for depression w/ a prior hx of multiple suicide attempts, dysphagia, A fib w/ RVR, and DVT who presented to the [facility] ED c/o "worsening generalized edema."   Assessment & Plan    Principal Problem: Paroxysmal atrial fibrillation with RVR  -Patient had presented with atrial fibrillation with RVR and was subsequently started on diltiazem. She converted to normal sinus rhythm spontaneously. -Will continue diltiazem 30 mg every 8 hours - cont Xarelto as CHA2DS2 - VASc score is at least 4 - Obtain 2-D echocardiogram, ruled out for acute ACS, TSH 0.5  Microcytic anemia  Check anemia panel, no obvious GI bleeding  Bipolar disorder To resume Abilify per Psych   HTN Currently stable  Chronic diastolic CHF  - Currently stable and appears to be euvolemic, negative balance of +940 mL.  - baseline wgt appears to be ~46kg as of April 2016 - presently 46.4kg  Hx of DVT Continue Xarelto  Code Status: Full code  Family Communication: Discussed in detail with the patient, all imaging results, lab results explained to the patient    Disposition Plan: DC home in a.m.  Time Spent in minutes  25 minutes  Procedures  2-D echo  Consults   None  DVT Prophylaxis xarelto  Medications  Scheduled Meds: . ARIPiprazole  10 mg Oral Daily  . diltiazem  30 mg Oral 3 times per day  . feeding supplement (ENSURE ENLIVE)  237 mL Oral TID BM  . rivaroxaban  15 mg Oral Q supper   Continuous Infusions: . sodium  chloride 50 mL/hr at 10/03/14 1544   PRN Meds:.acetaminophen, ondansetron (ZOFRAN) IV   Antibiotics   Anti-infectives    None        Subjective:   Mekaila Tarnow was seen and examined today.  Appeared comfortable, denies any fevers or chills, chest pain or shortness of breath. Afebrile no acute issues.  Objective:   Blood pressure 130/58, pulse 61, temperature 98.4 F (36.9 C), temperature source Oral, resp. rate 20, height  (1.499 m), weight 46.448 kg (102 lb 6.4 oz), SpO2 98 %.  Wt Readings from Last 3 Encounters:  10/04/14 46.448 kg (102 lb 6.4 oz)  07/02/14 46.267 kg (102 lb)  03/28/11 40.824 kg (90 lb)     Intake/Output Summary (Last 24 hours) at 10/04/14 1118 Last data filed at 10/03/14 1800  Gross per 24 hour  Intake 303.33 ml  Output    100 ml  Net 203.33 ml    Exam  General: Alert and oriented  NAD  HEENT:  PERRLA,  EOMI, Anicteric Sclera, mucous membranes moist.   Neck: Supple, no JVD, no masses  CVS: S1 S2 auscultated, no rubs, murmurs or gallops. Regular rate and rhythm.  Respiratory: Clear to auscultation bilaterally, no wheezing, rales or rhonchi  Abdomen: Soft, nontender, nondistended, + bowel sounds  Ext: no cyanosis clubbing or edema  Neuro: moving all 4 extremities spontaneously  Skin: No rashes  Psych: Normal affect and demeanor, alert and oriented x3    Data Review   Micro Results Recent Results (from the past 240 hour(s))  MRSA PCR Screening     Status: None   Collection Time: 10/02/14  7:10 AM  Result Value Ref Range Status   MRSA by PCR NEGATIVE NEGATIVE Final    Comment:        The GeneXpert MRSA Assay (FDA approved for NASAL specimens only), is one component of a comprehensive MRSA colonization surveillance program. It is not intended to diagnose MRSA infection nor to guide or monitor treatment for MRSA infections.     Radiology Reports Dg Chest 2 View  10/01/2014   CLINICAL DATA:  One day history of  chest pain  EXAM: CHEST  2 VIEW  COMPARISON:  July 02, 2014  FINDINGS: There is no edema or consolidation. The heart size and pulmonary vascularity are normal. No adenopathy. There is a sizable hiatal type hernia with much of the stomach above the diaphragm. There is degenerative change in the thoracic spine.  IMPRESSION: Sizable hiatal type hernia.  No lung edema or consolidation.   Electronically Signed   By: Bretta BangWilliam  Woodruff III M.D.   On: 10/01/2014 21:38    CBC  Recent Labs Lab 10/01/14 2135 10/04/14 0433  WBC 8.2 5.1  HGB 11.9* 10.6*  HCT 39.0 34.8*  PLT 330 285  MCV 75.3* 77.7*  MCH 23.0* 23.7*  MCHC 30.5 30.5  RDW 16.4* 16.3*    Chemistries   Recent Labs Lab 10/01/14 2135 10/04/14 0433  NA 138 138  K 3.4* 4.4  CL 104 108  CO2 24 25  GLUCOSE 151* 88  BUN 9 6  CREATININE 0.76 0.60  CALCIUM 9.9 9.4  AST  --  14*  ALT  --  10*  ALKPHOS  --  78  BILITOT  --  0.8   ------------------------------------------------------------------------------------------------------------------ estimated creatinine clearance is 42.1 mL/min (by C-G formula based on Cr of 0.6). ------------------------------------------------------------------------------------------------------------------ No results for input(s): HGBA1C in the last 72 hours. ------------------------------------------------------------------------------------------------------------------ No results for input(s): CHOL, HDL, LDLCALC, TRIG, CHOLHDL, LDLDIRECT in the last 72 hours. ------------------------------------------------------------------------------------------------------------------  Recent Labs  10/02/14 0905  TSH 0.511   ------------------------------------------------------------------------------------------------------------------ No results for input(s): VITAMINB12, FOLATE, FERRITIN, TIBC, IRON, RETICCTPCT in the last 72 hours.  Coagulation profile No results for input(s): INR, PROTIME in the  last 168 hours.  No results for input(s): DDIMER in the last 72 hours.  Cardiac Enzymes  Recent Labs Lab 10/02/14 0905  TROPONINI <0.03   ------------------------------------------------------------------------------------------------------------------ Invalid input(s): POCBNP  No results for input(s): GLUCAP in the last 72 hours.   Hartley Urton M.D. [facility] 10/04/2014, 11:18 AM  Pager: [phone] Between 7am to 7pm - call Pager - [phone]  After 7pm go to [email or web address] - password [email or web address]  Call night coverage person covering after 7pm

## 2014-10-04 NOTE — Progress Notes (Signed)
2nd dose po cardizem held due to HR 52. Emelda Brothershristy Paulita Licklider RN

## 2014-10-04 NOTE — Progress Notes (Signed)
Pt refusing IVF tonight.  She states "you can hook me back up in the morning".  Pt is drinking fluids and has voided 3 times since 1930.

## 2014-10-04 NOTE — Progress Notes (Signed)
Held 2200 dose of Cardizem, HR on monitor at 55-59 range. Pt asleep

## 2014-10-04 NOTE — Progress Notes (Signed)
Collection hat for I/O was placed in toilet.  Pt stated,  "take it out, I don't want that in there".

## 2014-10-05 ENCOUNTER — Telehealth: Payer: Self-pay

## 2014-10-05 ENCOUNTER — Ambulatory Visit (HOSPITAL_COMMUNITY): Payer: Medicare Other

## 2014-10-05 DIAGNOSIS — I4891 Unspecified atrial fibrillation: Secondary | ICD-10-CM

## 2014-10-05 DIAGNOSIS — F3130 Bipolar disorder, current episode depressed, mild or moderate severity, unspecified: Secondary | ICD-10-CM | POA: Diagnosis not present

## 2014-10-05 DIAGNOSIS — I509 Heart failure, unspecified: Secondary | ICD-10-CM | POA: Diagnosis not present

## 2014-10-05 DIAGNOSIS — I5032 Chronic diastolic (congestive) heart failure: Secondary | ICD-10-CM | POA: Diagnosis not present

## 2014-10-05 DIAGNOSIS — I480 Paroxysmal atrial fibrillation: Secondary | ICD-10-CM | POA: Diagnosis not present

## 2014-10-05 DIAGNOSIS — I10 Essential (primary) hypertension: Secondary | ICD-10-CM | POA: Diagnosis not present

## 2014-10-05 DIAGNOSIS — F0390 Unspecified dementia without behavioral disturbance: Secondary | ICD-10-CM | POA: Diagnosis not present

## 2014-10-05 MED ORDER — DILTIAZEM HCL 30 MG PO TABS
30.0000 mg | ORAL_TABLET | Freq: Two times a day (BID) | ORAL | Status: DC
  Administered 2014-10-05 – 2014-10-06 (×2): 30 mg via ORAL
  Filled 2014-10-05 (×2): qty 1

## 2014-10-05 MED ORDER — DILTIAZEM HCL 25 MG/5ML IV SOLN
10.0000 mg | Freq: Once | INTRAVENOUS | Status: AC
  Administered 2014-10-05: 10 mg via INTRAVENOUS
  Filled 2014-10-05: qty 5

## 2014-10-05 MED ORDER — DILTIAZEM HCL 30 MG PO TABS: 30.0000 mg | ORAL_TABLET | Freq: Every day | ORAL | Status: DC

## 2014-10-05 MED ORDER — RIVAROXABAN 15 MG PO TABS: 15.0000 mg | ORAL_TABLET | Freq: Every day | ORAL | Status: AC

## 2014-10-05 MED ORDER — ARIPIPRAZOLE 10 MG PO TABS: 10.0000 mg | ORAL_TABLET | Freq: Every day | ORAL | Status: DC

## 2014-10-05 NOTE — Discharge Summary (Addendum)
Physician Discharge Summary   Patient ID: Shannon Hurst MRN: [id number] DOB/AGE: November 20, 1940 74 y.o.  Admit date: 10/01/2014 Discharge date: 10/05/2014  Primary Care Physician:  Quitman LivingsHASSAN,SAMI, MD  Discharge Diagnoses:     . Atrial fibrillation with rapid ventricular response . Bipolar disorder . HTN (hypertension)   Consults: None   Recommendations for Outpatient Follow-up:  Patient is started on Abilify, she has psychiatry outpatient appointment   Patient is also started on xarelto.  TESTS THAT NEED FOLLOW-UP CBC, BMET   DIET: heart healthy     Allergies:   Allergies  Allergen Reactions  . Dalmane [Flurazepam Hcl] Rash  . Penicillins Rash  . Sulfa Antibiotics Rash     Discharge Medications:   Medication List    TAKE these medications        acetaminophen 160 MG/5ML suspension  Commonly known as:  TYLENOL  Take 480 mg by mouth 2 (two) times daily as needed. For pain.     ARIPiprazole 10 MG tablet  Commonly known as:  ABILIFY  Take 1 tablet (10 mg total) by mouth daily.     diltiazem 30 MG tablet  Commonly known as:  CARDIZEM  Take 1 tablet (30 mg total) by mouth daily.  Start taking on:  10/06/2014     ENSURE PLUS Liqd  Take 237 mLs by mouth 3 (three) times daily between meals.     Rivaroxaban 15 MG Tabs tablet  Commonly known as:  XARELTO  Take 1 tablet (15 mg total) by mouth daily with supper.         Brief H and P: For complete details please refer to admission H and P, but in brief 74 y/o female with a recent admission to [facility] ~ until 09/22/14 for depression w/ a prior hx of multiple suicide attempts, dysphagia, A fib w/ RVR, and DVT who [facility] c/o "worsening generalized edema."  Hospital Course:   Paroxysmal atrial fibrillation with RVR -now back in sinus rhythm  -Patient had presented with atrial fibrillation with RVR and was subsequently started on diltiazem. She converted to normal sinus rhythm spontaneously.  Patient was initially started on Cardizem 30 mg every 8 hours however her heart rate mostly has been 50's-90's and normal sinus rhythm. For now, continue Cardizem 30mg  daily. cont Xarelto as CHA2DS2 - VASc score is at least 4. TSH 0.5 2-D echo showed EF 60-65%, grade 1 diastolic dysfunction.  Microcytic anemia  no obvious GI bleeding, outpatient GI workup if indicated  Bipolar disorder To resume Abilify per Psych, outpatient psych appointment has been made.    HTN Currently stable  Chronic diastolic CHF  - Currently stable and appears to be euvolemic. Baseline wgt appears to be ~46kg as of April 2016 - presently 46.4kg  Hx of DVT Continue Xarelto  Day of Discharge BP 119/65 mmHg  Pulse 81  Temp(Src) 98.2 F (36.8 C) (Oral)  Resp 14  Ht 4\' 11"  (1.499 m)  Wt 46.63 kg (102 lb 12.8 oz)  BMI 20.75 kg/m2  SpO2 99%  Physical Exam: General: Alert and awake oriented x3 not in any acute distress. HEENT: anicteric sclera, pupils reactive to light and accommodation CVS: S1-S2 clear no murmur rubs or gallops Chest: clear to auscultation bilaterally, no wheezing rales or rhonchi Abdomen: soft nontender, nondistended, normal bowel sounds Extremities: no cyanosis, clubbing or edema noted bilaterally Neuro: Cranial nerves II-XII intact, no focal neurological deficits   The results of significant diagnostics from this hospitalization (including imaging,  microbiology, ancillary and laboratory) are listed below for reference.    LAB RESULTS: Basic Metabolic Panel:  Recent Labs Lab 10/01/14 2135 10/04/14 0433  NA 138 138  K 3.4* 4.4  CL 104 108  CO2 24 25  GLUCOSE 151* 88  BUN 9 6  CREATININE 0.76 0.60  CALCIUM 9.9 9.4   Liver Function Tests:  Recent Labs Lab 10/04/14 0433  AST 14*  ALT 10*  ALKPHOS 78  BILITOT 0.8  PROT 5.7*  ALBUMIN 2.8*   No results for input(s): LIPASE, AMYLASE in the last 168 hours. No results for input(s): AMMONIA in the last 168  hours. CBC:  Recent Labs Lab 10/01/14 2135 10/04/14 0433  WBC 8.2 5.1  HGB 11.9* 10.6*  HCT 39.0 34.8*  MCV 75.3* 77.7*  PLT 330 285   Cardiac Enzymes:  Recent Labs Lab 10/02/14 0905  TROPONINI <0.03   BNP: Invalid input(s): POCBNP CBG: No results for input(s): GLUCAP in the last 168 hours.  Significant Diagnostic Studies:  Dg Chest 2 View  10/01/2014   CLINICAL DATA:  One day history of chest pain  EXAM: CHEST  2 VIEW  COMPARISON:  July 02, 2014  FINDINGS: There is no edema or consolidation. The heart size and pulmonary vascularity are normal. No adenopathy. There is a sizable hiatal type hernia with much of the stomach above the diaphragm. There is degenerative change in the thoracic spine.  IMPRESSION: Sizable hiatal type hernia.  No lung edema or consolidation.   Electronically Signed   By: Bretta Bang III M.D.   On: 10/01/2014 21:38    2D ECHO: Study Conclusions  - Left ventricle: The cavity size was normal. Systolic function was normal. The estimated ejection fraction was in the range of 60% to 65%. Wall motion was normal; there were no regional wall motion abnormalities. There was an increased relative contribution of atrial contraction to ventricular filling. Doppler parameters are consistent with abnormal left ventricular relaxation (grade 1 diastolic dysfunction). - Aortic valve: Poorly visualized. - Mitral valve: Calcified annulus  Disposition and Follow-up:    DISPOSITION: home    DISCHARGE FOLLOW-UP Follow-up Information    Follow up with [facility], [place].   Specialty:  Internal Medicine   Why:  please arrange follow up appointment.   Contact information:   [address] [place] [address] [phone]       Follow up with [facility]. Go on 10/12/2014.   Why:  10/12/2014 10:45 am be sure to bring your ins cards, Outpatient psychiatric follow-up   Contact information:   [address]  [place]  [place] [phone]       Time spent on Discharge: 35 mins  Signed:   Hazelee Harbold M.D. [facility] 10/05/2014, 2:52 PM Pager: [phone]

## 2014-10-05 NOTE — Care Management Important Message (Signed)
Important Message  Patient Details  Name: Shannon Hurst MRN: [id number] Date of Birth: [birth year]   Medicare Important Message Given:  Yes-second notification given    [name] 10/05/2014, 3:11 PM

## 2014-10-05 NOTE — Clinical Social Work Psych Note (Signed)
Psych was consulted for capacity and hx dx of bi-polar.  Patient has hx of non-compliance.  Patient is currently agreeable to outpatient follow-up.  Psych CSW contacted [facility] to schedule counseling appointment: Monday July 25th 10:45am.  Per [facility], psychiatrist does not provide medication management to geriatric patients.  Per psychiatrist at [facility], patient will need to be followed by PCP for medication management at time of discharge.  Per chart review, patient has no PCP on file.  Psych CSW consulted RNCM to obtain PCP and assist with appointment at time of discharge for medication management.  Shannon PennaGina Lakesa Coste, LCSW [phone]  Psychiatric & Orthopedics (5N 1-8) Clinical Social Worker

## 2014-10-05 NOTE — Progress Notes (Signed)
  Echocardiogram 2D Echocardiogram has been performed.  Janalyn HarderWest, Martin Smeal R 10/05/2014, 12:35 PM

## 2014-10-05 NOTE — Progress Notes (Signed)
[facility]                                                                              Patient Demographics  Shannon Hurst, is a 74 y.o. female, DOB - 1940/06/09, OZH:[id number]  Admit date - 10/01/2014   Admitting Physician Lorretta Harp, MD  Outpatient Primary MD for the patient is [facility], MD  LOS - 3   Chief Complaint  Patient presents with  . Hypertension       Brief HPI  74 y/o female with a recent admission to [facility] ~ until 09/22/14 for depression w/ a prior hx of multiple suicide attempts, dysphagia, A fib w/ RVR, and DVT who presented to the [facility] ED c/o "worsening generalized edema."   Assessment & Plan    Principal Problem: Paroxysmal atrial fibrillation with RVR  -Patient had presented with atrial fibrillation with RVR and was subsequently started on diltiazem. She converted to normal sinus rhythm spontaneously however today reverted again to Afib with RVR with anxiety. - give cardizem  IV x1, then continue oral cardizem  q12 hrs - cont Xarelto as CHA2DS2 - VASc score is at least 4 - ruled out for acute ACS, TSH 0.5 - 2-D echo showed EF 60-65%, grade 1 diastolic dysfunction  Microcytic anemia   no obvious GI bleeding  Bipolar disorder To resume Abilify per Psych   HTN Currently stable  Chronic diastolic CHF  - Currently stable and appears to be euvolemic, negative balance of +940 mL.  - baseline wgt appears to be ~46kg as of April 2016 - presently 46.4kg  Hx of DVT Continue Xarelto  Code Status: Full code  Family Communication: Discussed in detail with the patient, all imaging results, lab results explained to the patient    Disposition Plan: DC home in a.m.  Time Spent in minutes  25 minutes  Procedures  2-D echo  Consults   None  DVT Prophylaxis xarelto  Medications  Scheduled Meds: . ARIPiprazole  10 mg Oral Daily  . diltiazem  10 mg Intravenous Once  . [START ON 10/06/2014] diltiazem  30 mg Oral Q12H  .  feeding supplement (ENSURE ENLIVE)  237 mL Oral TID BM  . rivaroxaban  15 mg Oral Q supper   Continuous Infusions:   PRN Meds:.acetaminophen, ondansetron (ZOFRAN) IV   Antibiotics   Anti-infectives    None        Subjective:   Shannon Hurst was seen and examined today.  Appeared comfortable, denied any fevers or chills, chest pain or shortness of breath. Afebrile no acute issues. Subsequently, reverted back to Afib with RVR   Objective:   Blood pressure 130/68, pulse 145, temperature 98.2 F (36.8 C), temperature source Oral, resp. rate 14, height  (1.499 m), weight 46.63 kg (102 lb 12.8 oz), SpO2 99 %.  Wt Readings from Last 3 Encounters:  10/05/14 46.63 kg (102 lb 12.8 oz)  07/02/14 46.267 kg (102 lb)  03/28/11 40.824 kg (90 lb)     Intake/Output Summary (Last 24 hours) at 10/05/14 1548 Last data filed at 10/05/14 0500  Gross per 24 hour  Intake    330  ml  Output      0 ml  Net    330 ml    Exam  General: Alert and oriented  NAD  HEENT:  PERRLA, EOMI, Anicteric Sclera, mucous membranes moist.   Neck: Supple, no JVD, no masses  CVS:  ireg ireg, tachy  Respiratory: Clear to auscultation bilaterally, no wheezing, rales or rhonchi  Abdomen: Soft, nontender, nondistended, + bowel sounds  Ext: no cyanosis clubbing or edema  Neuro: moving all 4 extremities spontaneously  Skin: No rashes  Psych: Normal affect and demeanor, alert and oriented x3    Data Review   Micro Results Recent Results (from the past 240 hour(s))  MRSA PCR Screening     Status: None   Collection Time: 10/02/14  7:10 AM  Result Value Ref Range Status   MRSA by PCR NEGATIVE NEGATIVE Final    Comment:        The GeneXpert MRSA Assay (FDA approved for NASAL specimens only), is one component of a comprehensive MRSA colonization surveillance program. It is not intended to diagnose MRSA infection nor to guide or monitor treatment for MRSA infections.     Radiology  Reports Dg Chest 2 View  10/01/2014   CLINICAL DATA:  One day history of chest pain  EXAM: CHEST  2 VIEW  COMPARISON:  July 02, 2014  FINDINGS: There is no edema or consolidation. The heart size and pulmonary vascularity are normal. No adenopathy. There is a sizable hiatal type hernia with much of the stomach above the diaphragm. There is degenerative change in the thoracic spine.  IMPRESSION: Sizable hiatal type hernia.  No lung edema or consolidation.   Electronically Signed   By: Bretta BangWilliam  Woodruff III M.D.   On: 10/01/2014 21:38    CBC  Recent Labs Lab 10/01/14 2135 10/04/14 0433  WBC 8.2 5.1  HGB 11.9* 10.6*  HCT 39.0 34.8*  PLT 330 285  MCV 75.3* 77.7*  MCH 23.0* 23.7*  MCHC 30.5 30.5  RDW 16.4* 16.3*    Chemistries   Recent Labs Lab 10/01/14 2135 10/04/14 0433  NA 138 138  K 3.4* 4.4  CL 104 108  CO2 24 25  GLUCOSE 151* 88  BUN 9 6  CREATININE 0.76 0.60  CALCIUM 9.9 9.4  AST  --  14*  ALT  --  10*  ALKPHOS  --  78  BILITOT  --  0.8   ------------------------------------------------------------------------------------------------------------------ estimated creatinine clearance is 42.1 mL/min (by C-G formula based on Cr of 0.6). ------------------------------------------------------------------------------------------------------------------ No results for input(s): HGBA1C in the last 72 hours. ------------------------------------------------------------------------------------------------------------------ No results for input(s): CHOL, HDL, LDLCALC, TRIG, CHOLHDL, LDLDIRECT in the last 72 hours. ------------------------------------------------------------------------------------------------------------------ No results for input(s): TSH, T4TOTAL, T3FREE, THYROIDAB in the last 72 hours.  Invalid input(s): FREET3 ------------------------------------------------------------------------------------------------------------------ No results for input(s):  VITAMINB12, FOLATE, FERRITIN, TIBC, IRON, RETICCTPCT in the last 72 hours.  Coagulation profile No results for input(s): INR, PROTIME in the last 168 hours.  No results for input(s): DDIMER in the last 72 hours.  Cardiac Enzymes  Recent Labs Lab 10/02/14 0905  TROPONINI <0.03   ------------------------------------------------------------------------------------------------------------------ Invalid input(s): POCBNP  No results for input(s): GLUCAP in the last 72 hours.   RAI,RIPUDEEP M.D. [facility] 10/05/2014, 3:48 PM  Pager: [phone] Between 7am to 7pm - call Pager - [phone]  After 7pm go to [email or web address] - password [email or web address]  Call night coverage person covering after 7pm

## 2014-10-05 NOTE — Telephone Encounter (Signed)
Call received from Isidoro DonningAlesia Shavis, CM requesting an appointment for the patient.  An appointment was scheduled for 10/07/14 @ 1130.  Another call was then received from A. Shavis,CM  requesting that the appointment be cancelled as the patient already has a PCP that she will follow up with.

## 2014-10-05 NOTE — Care Management Note (Addendum)
Case Management Note  Patient Details  Name: Shannon Hurst MRN: [id number] Date of Birth: [birth year]  Subjective/Objective:    Afib                Action/Plan: Lives at home alone. Has aide for 2.5 hours per day.  Expected Discharge Date:  10/05/2014               Expected Discharge Plan:  Home/Self Care  In-House Referral:  Clinical Social Work  Discharge planning Services  CM Consult   Status of Service:  Completed, signed off  Medicare Important Message Given:    Date Medicare IM Given:    Medicare IM give by:    Date Additional Medicare IM Given:    Additional Medicare Important Message give by:     If discussed at Long Length of Stay Meetings, dates discussed:    Additional Comments: 10/05/2014 1130 NCM spoke Harlon FlorJackie Johnson, she manages her aide services. She has arranged for pt to follow up with Dr. Roseanne RenoHassan, PCP.  Case Worker states pt will need additional hours for Personal Care Services. Requested NCM fax dc summary to pt's PCP office. [facility]. [name] aide, Elmarie Shileyiffany will pick up pt after 3 pm today. Isidoro DonningAlesia Onyx Edgley RN CCM Case Mgmt phone [phone]   Consult for [facility] and Xarelto. NCM spoke to pt and states her Case Worker, Annice PihJackie assist her with getting to appts and picking up meds from pharmacy. Attempted call to number for Annice PihJackie, # [phone] or [phone], but the mailbox was full. Provided pt with a 30 day trial card for Xarelto. Pt has Medicaid that covers her meds. Xarelto is a preferred medication for Medicaid. Pt has a Agricultural consultantolling Walker with a seat in the room from home. [facility]. The aide comes 2.5 hours per day, Mon-Fri. [facility], [facility] and pt not appropriate for [facility]. Will arrange an [facility]t for [facility].  Elliot CousinShavis, Allex Madia Ellen, RN 10/05/2014, 10:36 AM

## 2014-10-05 NOTE — Progress Notes (Signed)
10/05/14 1236   Important Message Medicare important message given? Yes-second notification given

## 2014-10-05 NOTE — Evaluation (Signed)
Occupational Therapy Evaluation Patient Details Name: Shannon PolesGoldie Adams Sonn MRN: [id number] DOB: Feb 11, 1941 Today's Date: 10/05/2014    History of Present Illness 74 y.o. female with PMHX of GERD, mood disorder, depression, difficulty swallowing, Hx of suicide attempt and DVT. She presented to the ED 10-01-14 with c/o LE edema. She was admitted with dx of bipolar disorder.   Clinical Impression   Pt admitted with above. Pt reports she gets assist with ADLs at home. Recommending HHOT upon d/c. Pt could not tell OT what number she would call her if house was on fire.     Follow Up Recommendations  Home health OT;Supervision/Assistance - 24 hour    Equipment Recommendations  None recommended by OT    Recommendations for Other Services       Precautions / Restrictions Precautions Precautions: Fall Restrictions Weight Bearing Restrictions: No      Mobility Bed Mobility                  Transfers Overall transfer level: Needs assistance   Transfers: Sit to/from Stand Sit to Stand: Supervision         General transfer comment: cues to lock breaks on rollator and hand placement    Balance  No LOB in session.                                          ADL Overall ADL's : Needs assistance/impaired     Grooming: Wash/dry face;Set up;Supervision/safety;Standing               Lower Body Dressing: Sit to/from stand;Moderate assistance (pt able to don/doff briefs with setup/supervision assist) Lower Body Dressing Details (indicate cue type and reason): seems to be self limiting, refused to take off socks and reports she can't do it. Toilet Transfer: Supervision/safety;Ambulation (rollator; did not sit on commode, but stood/squatted)   Toileting- Clothing Manipulation and Hygiene: Supervision/safety;Sit to/from stand       Functional mobility during ADLs: Supervision/safety (rollator) General ADL Comments: Rinsed out coffee cup at sink. Pt  washed face standing at sink. When asked for pt to take socks off, she insists she can't and that someone helps her with this. Pt able to later don/doff briefs in session.     Vision     Perception     Praxis      Pertinent Vitals/Pain Pain Assessment: Faces Faces Pain Scale: Hurts little more Pain Location: bilateral LEs Pain Descriptors / Indicators:  (pt reported pain and that it hurt a lot) Pain Intervention(s): Monitored during session     Hand Dominance     Extremity/Trunk Assessment Upper Extremity Assessment Upper Extremity Assessment: Overall WFL for tasks assessed   Lower Extremity Assessment Lower Extremity Assessment: Defer to PT evaluation       Communication Communication Communication: No difficulties   Cognition Arousal/Alertness: Awake/alert Behavior During Therapy: WFL for tasks assessed/performed (became agitated) Overall Cognitive Status: History of cognitive impairments - at baseline (decreased attention and memory in session)                     General Comments       Exercises       Shoulder Instructions      Home Living Family/patient expects to be discharged to:: Private residence Living Arrangements: Alone Available Help at Discharge: Personal care attendant;Available PRN/intermittently Type of Home: House Home Access:  Level entry     Home Layout: One level     Bathroom Shower/Tub: Tub/shower unit         Home Equipment: Walker - 4 wheels          Prior Functioning/Environment Level of Independence: Needs assistance        Comments: Per PT evaluation, pt independent with assistive device PTA. Pt reported to OT that someone assists her with bathing/dressing    OT Diagnosis: Acute pain   OT Problem List: Decreased cognition;Decreased safety awareness;Decreased knowledge of use of DME or AE;Decreased knowledge of precautions;Pain   OT Treatment/Interventions:      OT Goals(Current goals can be found in the  care plan section)    OT Frequency:     Barriers to D/C:            Co-evaluation              End of Session Equipment Utilized During Treatment: Gait belt;Other (comment) (rollator) Nurse Communication: Other (comment) (d/c recommendation; refused chair alarm)  Activity Tolerance: Patient tolerated treatment well Patient left: in chair;with call bell/phone within reach   Time: 1414-1430 OT Time Calculation (min): 16 min Charges:  OT General Charges $OT Visit: 1 Procedure OT Evaluation $Initial OT Evaluation Tier I: 1 Procedure G-CodesEarlie Raveling OTR/L [phone] 10/05/2014, 3:00 PM

## 2014-10-06 DIAGNOSIS — I509 Heart failure, unspecified: Secondary | ICD-10-CM | POA: Diagnosis not present

## 2014-10-06 DIAGNOSIS — I480 Paroxysmal atrial fibrillation: Secondary | ICD-10-CM | POA: Diagnosis not present

## 2014-10-06 DIAGNOSIS — I4891 Unspecified atrial fibrillation: Secondary | ICD-10-CM | POA: Diagnosis not present

## 2014-10-06 DIAGNOSIS — F3130 Bipolar disorder, current episode depressed, mild or moderate severity, unspecified: Secondary | ICD-10-CM | POA: Diagnosis not present

## 2014-10-06 MED ORDER — DILTIAZEM HCL 30 MG PO TABS: 30.0000 mg | ORAL_TABLET | Freq: Two times a day (BID) | ORAL | Status: AC

## 2014-10-06 NOTE — Care Management Note (Signed)
Case Management Note  Patient Details  Name: Rosebud PolesGoldie Adams Crupi MRN: [id number] Date of Birth: 07/10/1940  Subjective/Objective:   Pt admitted for A fib. Pt is from home alone, however has Aide services via PCS 7 days a week. CSW Harlon FlorJackie Johnson @ [phone] and Aide is Tiffany. Elmarie Shileyiffany will provide transportation home.                  Action/Plan: [facility]. SOC to begin within 24-48 hrs post d/c. CM did speak with Annice PihJackie in regards to upgrading PCS hours. Annice PihJackie will contact PCP to see if hours can be increased to 130 hours. No further needs identified by CM at this time.    Expected Discharge Date:                  Expected Discharge Plan:  Home/Self Care  In-House Referral:  Clinical Social Work  Discharge planning Services  CM Consult  Post Acute Care Choice:  NA Choice offered to:  Patient, HC POA / Guardian  DME Arranged:  N/A DME Agency:  NA  HH Arranged:  RN, OT HH Agency:  [facility]  Status of Service:  Completed, signed off  Medicare Important Message Given:  Yes-second notification given Date Medicare IM Given:    Medicare IM give by:    Date Additional Medicare IM Given:    Additional Medicare Important Message give by:     If discussed at Long Length of Stay Meetings, dates discussed:    Additional Comments:  Gala LewandowskyGraves-Bigelow, Temika Sutphin Kaye, RN 10/06/2014, 11:07 AM

## 2014-10-06 NOTE — Discharge Summary (Signed)
Physician Discharge Summary   Patient ID: Shannon Hurst MRN: [id number] DOB/AGE: 04-16-1940 74 y.o.  Admit date: 10/01/2014 Discharge date: 10/06/2014  Primary Care Physician:  Quitman LivingsHASSAN,SAMI, MD  Discharge Diagnoses:     . Atrial fibrillation with rapid ventricular response . Bipolar disorder . HTN (hypertension)   Consults: None   Recommendations for Outpatient Follow-up:  Patient is started on Abilify, she has psychiatry outpatient appointment   Patient is also started on xarelto.  TESTS THAT NEED FOLLOW-UP CBC, BMET   DIET: heart healthy     Allergies:   Allergies  Allergen Reactions  . Dalmane [Flurazepam Hcl] Rash  . Penicillins Rash  . Sulfa Antibiotics Rash     Discharge Medications:   Medication List    TAKE these medications        acetaminophen 160 MG/5ML suspension  Commonly known as:  TYLENOL  Take 480 mg by mouth 2 (two) times daily as needed. For pain.     ARIPiprazole 10 MG tablet  Commonly known as:  ABILIFY  Take 1 tablet (10 mg total) by mouth daily.     diltiazem 30 MG tablet  Commonly known as:  CARDIZEM  Take 1 tablet (30 mg total) by mouth 2 (two) times daily.     ENSURE PLUS Liqd  Take 237 mLs by mouth 3 (three) times daily between meals.     Rivaroxaban 15 MG Tabs tablet  Commonly known as:  XARELTO  Take 1 tablet (15 mg total) by mouth daily with supper.         Brief H and P: For complete details please refer to admission H and P, but in brief 74 y/o female [facility] 09[facility]14 for depression w/ a prior hx of multiple suicide attempts, dysphagia, A fib w/ RVR, and DVT who [facility] c/o "worsening generalized edema."  Hospital Course:   Paroxysmal atrial fibrillation with RVR -now back in sinus rhythm  -Patient had presented with atrial fibrillation with RVR and was subsequently started on diltiazem. She converted to normal sinus rhythm spontaneously. Patient however  reverted back to atrial fibrillation with RVR on 7/18, was given IV Cardizem 10 mg. Patient will continue oral Cardizem 30 mg every 12 hours. Continue Xarelto as CHA2DS2 - VASc score is at least 4. TSH 0.5 2-D echo showed EF 60-65%, grade 1 diastolic dysfunction.  Microcytic anemia  no obvious GI bleeding, outpatient GI workup if indicated  Bipolar disorder To resume Abilify per Psych, outpatient psych appointment has been made.    HTN Currently stable  Chronic diastolic CHF  - Currently stable and appears to be euvolemic. Baseline wgt appears to be ~46kg as of April 2016 - presently 46.4kg  Hx of DVT Continue Xarelto  Day of Discharge BP 101/58 mmHg  Pulse 74  Temp(Src) 98.3 F (36.8 C) (Oral)  Resp 17  Ht 4\' 11"  (1.499 m)  Wt 46.539 kg (102 lb 9.6 oz)  BMI 20.71 kg/m2  SpO2 96%  Physical Exam: General: Alert and awake oriented x3 not in any acute distress. HEENT: anicteric sclera, pupils reactive to light and accommodation CVS: S1-S2 clear no murmur rubs or gallops Chest: clear to auscultation bilaterally, no wheezing rales or rhonchi Abdomen: soft nontender, nondistended, normal bowel sounds Extremities: no cyanosis, clubbing or edema noted bilaterally Neuro: Cranial nerves II-XII intact, no focal neurological deficits   The results of significant diagnostics from this hospitalization (including imaging, microbiology, ancillary and laboratory) are  listed below for reference.    LAB RESULTS: Basic Metabolic Panel:  Recent Labs Lab 10/01/14 2135 10/04/14 0433  NA 138 138  K 3.4* 4.4  CL 104 108  CO2 24 25  GLUCOSE 151* 88  BUN 9 6  CREATININE 0.76 0.60  CALCIUM 9.9 9.4   Liver Function Tests:  Recent Labs Lab 10/04/14 0433  AST 14*  ALT 10*  ALKPHOS 78  BILITOT 0.8  PROT 5.7*  ALBUMIN 2.8*   No results for input(s): LIPASE, AMYLASE in the last 168 hours. No results for input(s): AMMONIA in the last 168 hours. CBC:  Recent Labs Lab  10/01/14 2135 10/04/14 0433  WBC 8.2 5.1  HGB 11.9* 10.6*  HCT 39.0 34.8*  MCV 75.3* 77.7*  PLT 330 285   Cardiac Enzymes:  Recent Labs Lab 10/02/14 0905  TROPONINI <0.03   BNP: Invalid input(s): POCBNP CBG: No results for input(s): GLUCAP in the last 168 hours.  Significant Diagnostic Studies:  Dg Chest 2 View  10/01/2014   CLINICAL DATA:  One day history of chest pain  EXAM: CHEST  2 VIEW  COMPARISON:  July 02, 2014  FINDINGS: There is no edema or consolidation. The heart size and pulmonary vascularity are normal. No adenopathy. There is a sizable hiatal type hernia with much of the stomach above the diaphragm. There is degenerative change in the thoracic spine.  IMPRESSION: Sizable hiatal type hernia.  No lung edema or consolidation.   Electronically Signed   By: Bretta Bang III M.D.   On: 10/01/2014 21:38    2D ECHO: Study Conclusions  - Left ventricle: The cavity size was normal. Systolic function was normal. The estimated ejection fraction was in the range of 60% to 65%. Wall motion was normal; there were no regional wall motion abnormalities. There was an increased relative contribution of atrial contraction to ventricular filling. Doppler parameters are consistent with abnormal left ventricular relaxation (grade 1 diastolic dysfunction). - Aortic valve: Poorly visualized. - Mitral valve: Calcified annulus  Disposition and Follow-up: Discharge Instructions    Increase activity slowly    Complete by:  As directed             DISPOSITION: home    DISCHARGE FOLLOW-UP Follow-up Information    Follow up with [facility] MD On 10/20/2014.   Specialty:  Internal Medicine   Why:  for hospital follow up @ 2:00pm   Contact information:   [address] [place] [address] [phone]       Follow up with [facility]. Go on 10/12/2014.   Why:  10/12/2014 10:45 am be sure to bring your ins cards, Outpatient psychiatric follow-up    Contact information:   [address]  [place] [phone]      Follow up with [facility].   Why:  Registered Nurse and Occupational Therapy   Contact information:   [address] [place] [address] [phone]        Time spent on Discharge: 35 mins  Signed:   Shellby Schlink M.D. [facility] 10/06/2014, 12:05 PM Pager: [phone]

## 2014-10-06 NOTE — Progress Notes (Signed)
Faxed dc summary and H&P to PCP's office, Dr. Roseanne RenoHassan. Appt scheduled for follow up with Dr. Roseanne RenoHassan on 10/20/2014.Isidoro DonningAlesia Merisa Julio RN CCM Case Mgmt phone [phone]

## 2014-10-07 ENCOUNTER — Inpatient Hospital Stay: Payer: Self-pay | Admitting: Internal Medicine

## 2014-10-24 ENCOUNTER — Emergency Department (EMERGENCY_DEPARTMENT_HOSPITAL): Payer: Medicare Other

## 2014-10-24 ENCOUNTER — Emergency Department
Admission: EM | Admit: 2014-10-24 | Discharge: 2014-10-25 | Disposition: A | Discharge status: Other Acute Inpatient Hospital | Payer: Medicare Other | Attending: Emergency Medicine | Admitting: Emergency Medicine

## 2014-10-24 DIAGNOSIS — J810 Acute pulmonary edema: Secondary | ICD-10-CM

## 2014-10-24 DIAGNOSIS — F32A Depression, unspecified: Secondary | ICD-10-CM | POA: Insufficient documentation

## 2014-10-24 DIAGNOSIS — F329 Major depressive disorder, single episode, unspecified: Principal | ICD-10-CM | POA: Insufficient documentation

## 2014-10-24 DIAGNOSIS — R45851 Suicidal ideations: Secondary | ICD-10-CM | POA: Insufficient documentation

## 2014-10-24 DIAGNOSIS — R918 Other nonspecific abnormal finding of lung field: Secondary | ICD-10-CM

## 2014-10-24 DIAGNOSIS — I517 Cardiomegaly: Secondary | ICD-10-CM

## 2014-10-24 DIAGNOSIS — R000 Tachycardia, unspecified: Secondary | ICD-10-CM

## 2014-10-24 HISTORY — DX: Disorder of kidney and ureter, unspecified: N28.9

## 2014-10-24 HISTORY — DX: Unspecified osteoarthritis, unspecified site: M19.90

## 2014-10-24 HISTORY — DX: Chronic obstructive pulmonary disease, unspecified: J44.9

## 2014-10-24 HISTORY — DX: Diaphragmatic hernia without obstruction or gangrene: K44.9

## 2014-10-24 LAB — CBC WITH DIFFERENTIAL
BASOPHILS % AUTO: 0.4 %
BASOPHILS ABS AUTO: 0 10*3/uL (ref 0–0.2)
EOSINOPHIL % AUTO: 0.4 %
EOSINOPHIL ABS AUTO: 0 10*3/uL (ref 0–0.5)
HEMATOCRIT: 36.4 % (ref 36–46)
HEMOGLOBIN: 11.2 g/dL — AB (ref 12.0–16.0)
LYMPHOCYTE ABS AUTO: 2.5 10*3/uL (ref 1.0–4.8)
LYMPHOCYTES % AUTO: 29.8 %
MCH: 22.7 pg — AB (ref 27–33)
MCHC: 30.8 % — AB (ref 32–36)
MCV: 73.6 UM3 — AB (ref 80–100)
MONOCYTES % AUTO: 9.1 %
MONOCYTES ABS AUTO: 0.8 10*3/uL (ref 0.1–0.8)
MPV: 8.5 UM3 (ref 6.8–10.0)
NEUTROPHIL ABS AUTO: 5 10*3/uL (ref 1.80–7.70)
NEUTROPHILS % AUTO: 60.3 %
PLATELET COUNT: 364 10*3/uL (ref 130–400)
RDW: 16.5 U — AB (ref 0–14.7)
RED CELL COUNT: 4.95 10*6/uL (ref 4.0–5.2)
WHITE BLOOD CELL COUNT: 8.3 10*3/uL (ref 4.5–11.0)

## 2014-10-24 LAB — BASIC METABOLIC PANEL
CALCIUM: 9.9 mg/dL (ref 8.6–10.5)
CARBON DIOXIDE TOTAL: 29 meq/L (ref 24–32)
CHLORIDE: 101 meq/L (ref 95–110)
CREATININE BLOOD: 0.74 mg/dL (ref 0.44–1.27)
GLUCOSE: 92 mg/dL (ref 70–99)
POTASSIUM: 4.4 meq/L (ref 3.3–5.0)
SODIUM: 138 meq/L (ref 135–145)
UREA NITROGEN, BLOOD (BUN): 18 mg/dL (ref 8–22)

## 2014-10-24 LAB — HEPATIC FUNCTION PANEL
ALANINE TRANSFERASE (ALT): 19 U/L (ref 5–54)
ALBUMIN: 3.3 g/dL (ref 3.2–4.6)
ALKALINE PHOSPHATASE (ALP): 99 U/L (ref 35–115)
ASPARTATE TRANSAMINASE (AST): 26 U/L (ref 15–43)
BILIRUBIN DIRECT: 0.2 mg/dL (ref 0.0–0.2)
BILIRUBIN TOTAL: 0.6 mg/dL (ref 0.3–1.3)
PROTEIN: 6.6 g/dL (ref 6.3–8.3)

## 2014-10-24 LAB — URINALYSIS-COMPLETE
BILIRUBIN URINE: NEGATIVE
GLUCOSE URINE: NEGATIVE mg/dL
KETONES: NEGATIVE mg/dL
LEUK. ESTERASE: NEGATIVE
NITRITE URINE: NEGATIVE
OCCULT BLOOD URINE: NEGATIVE mg/dL
PH URINE: 7.5 (ref 4.8–7.8)
PROTEIN URINE: NEGATIVE mg/dL
SPECIFIC GRAVITY: 1.018 (ref 1.002–1.030)
UROBILINOGEN.: NEGATIVE mg/dL (ref ?–2.0)

## 2014-10-24 LAB — TROPONIN I

## 2014-10-24 LAB — INR: INR: 1.01 (ref 0.87–1.18)

## 2014-10-24 LAB — B-TYPE NATRIURETIC PEPTIDE: B-TYPE NATRIURETIC PEPTIDE: 83 pg/mL (ref 0–100)

## 2014-10-24 LAB — UR DRUGS OF ABUSE SCREEN
AMPHETAMINE SCREEN, URINE: NEGATIVE ng/mL
BARBITURATES SCREEN, URINE: NEGATIVE ng/mL
BENZODIAZEPINES SCREEN, URINE: NEGATIVE ng/mL
COCAINE METABOLITE SCRN, URINE: NEGATIVE ng/mL
OPIATES SCREEN, URINE: NEGATIVE ng/mL

## 2014-10-24 LAB — ETHANOL, PLASMA: ETHANOL, PLASMA: NEGATIVE mg/dL

## 2014-10-24 NOTE — ED Nursing Note (Signed)
PES just arrived at bedside. Pt still wants to eat. Dr aware, tray will be ordered.

## 2014-10-24 NOTE — ED Nursing Note (Signed)
1 hour nursing lunch relief provided. EKG done and given to Dr. Raynelle Fanning who is BS to examine the patient. PIV placed on single attempt to [facility] and labs sent. Pt asking for a "supper tray." Informed she will have to wait for results before we can feed her. She verbalizes understanding of this. Placed on monitors, sitting upright in the gurney without distress.

## 2014-10-24 NOTE — ED Nursing Note (Signed)
Pt had wet the bed. Pt still got up and ambulated to restroom and gave urine for lab. Pt cleaned up as well as bed. No other complaints verbalized at this time.

## 2014-10-24 NOTE — ED Nursing Note (Signed)
Report to [facility] and transferred care of patient.

## 2014-10-24 NOTE — ED Triage Note (Signed)
Pt biba for eval of feeling weak and pain all over. [place], attempting to find a place to live, requesting an ICP form so she can get placement at [facility]. Pt also feeling suicidal. Pt calm, cooperative. Pt to room

## 2014-10-24 NOTE — ED Initial Note (Signed)
EMERGENCY DEPARTMENT PHYSICIAN NOTE - Geralynn Ochs       Date of Service:   10/24/2014 11:57 AM Patient's PCP: No primary care provider on file.   Note Started: 10/24/2014 13:04 DOB: Nov 05, 1940             Chief Complaint   Patient presents with    Weakness           The history provided by the patient.  Interpreter used: No    Brianna Guzman is a 74yr old female, with a past medical history significant for arthritis, COPD, kidney disease, hiatal hernia, who presents to the ED with a chief complaint of numbness of the feet and hands BL that began 3 weeks ago. Unable to ambulate.     Pt says she recently moved from [place] and is currently homeless. Also reports having suicidal ideation and has attempted in the past to slit her throat with a clothes hanger while she was in the hospital. She says she recently lost her nephew and her best friend and has been depressed and feeling suicidal.     Reports having palpitations, shortness of breath, left side chest discomfort, difficulty sleeping, dysuria.   Denies nausea, vomiting, abdominal pain, diarrhea, blood in stool, hematuria, fever, or chills.     No abdominal or cardiac surgeries.  Taking Tylenol and denies any other medications.      A full history, including pertinent past medical and social history was reviewed.    HISTORY:  There are no hospital problems to display for this patient.   Allergies   Allergen Reactions    Pcn (Penicillin) [Penicillins] Rash    Sulfa (Sulfonamide Antibiotics) Rash      Past Medical History:    Arthritis                                                     Kidney disease                                                Hiatal hernia                                                 COPD (chronic obstructive pulmonary disease)               Past Surgical History:    Hysterectomy abdominal open                                   Social History    Marital Status: SINGLE              Spouse Name:                       Years of  Education:                 Number of children:               Occupational History  None on file    Social History Main Topics    Smoking Status: Never Smoker                      Smokeless Status: Not on file                       Alcohol Use: No              Drug Use: No              Sexual Activity: Not on file          Other Topics            Concern    None on file    Social History Narrative    10/24/2014 moved from [place], [place]. [place] recently. Currently homeless.      No family history on file.       Review of Systems   Constitutional: Negative for fever and chills.   Respiratory: Positive for shortness of breath.    Cardiovascular: Positive for palpitations and leg swelling (BLE legs, 3 weeks).        +L side chest discomfort   Gastrointestinal: Negative for nausea, vomiting, diarrhea and blood in stool.   Genitourinary: Positive for dysuria. Negative for hematuria.   Neurological: Positive for numbness (BUE and BLE).   Psychiatric/Behavioral: Positive for suicidal ideas.        +depressed  +difficulty sleeping   All other systems reviewed and are negative.      TRIAGE VITAL SIGNS:  Temp: 36.7 C (98.1 F) (10/24/14 1152)  Temp src: Oral (10/24/14 1152)  Pulse: 95 (10/24/14 1152)  BP: 137/79 mmHg (10/24/14 1152)  Resp: 18 (10/24/14 1152)  SpO2: 98 % (10/24/14 1152)  Weight: (not recorded)    Physical Exam   Constitutional: She is oriented to person, place, and time. She appears well-developed and well-nourished. No distress.   HENT:   Head: Normocephalic and atraumatic.   Mouth/Throat: Oropharynx is clear and moist.   Eyes: Pupils are equal, round, and reactive to light. Right eye exhibits no discharge. Left eye exhibits no discharge.   Neck: Normal range of motion. Neck supple.   Cardiovascular: Normal heart sounds and intact distal pulses.  An irregularly irregular rhythm present. Tachycardia present.  Exam reveals no gallop and no friction rub.    No murmur heard.  Pulmonary/Chest: Effort normal and  breath sounds normal. No respiratory distress. She has no wheezes. She has no rales. She exhibits no tenderness.   Abdominal: Soft. She exhibits no distension and no mass. There is no tenderness. There is no rebound and no guarding.   Musculoskeletal: She exhibits edema (BLE, not involving the feet. Non-pitting).   Lymphadenopathy:     She has no cervical adenopathy.   Neurological: She is alert and oriented to person, place, and time.   Able to dorsiflex and plantar flex BL feet.   Unable to raise BLE 10 degrees.   BUE Sensations intact.    Skin: Skin is warm and dry. She is not diaphoretic.   Psychiatric: She has a normal mood and affect. Her behavior is normal.   Nursing note and vitals reviewed.        INITIAL ASSESSMENT & PLAN, MEDICAL DECISION MAKING, ED COURSE  Brianna Guzman is a 88yr female who presents with a chief complaint of numbness of BUE and BLE and suicidal ideation.  Differential includes, but is not limited to: SI, depression, bereavement.     The results of the ED evaluation were notable for the following:    Pertinent lab results:   Labs Reviewed   CBC WITH DIFFERENTIAL - Abnormal; Notable for the following:     HEMOGLOBIN 11.2 (*)     MCV 73.6 (*)     MCH 22.7 (*)     MCHC 30.8 (*)     RDW 16.5 (*)     All other components within normal limits   BASIC METABOLIC PANEL   HEPATIC FUNCTION PANEL   B-TYPE NATRIURETIC PEPTIDE   TROPONIN I   INR   ETHANOL, PLASMA   URINALYSIS-COMPLETE   UR DRUGS OF ABUSE SCREEN      Pertinent imaging results (reviewed and interpreted independently by me):   CHEST 1 VIEW:   Cardiomegaly, perihilar fullness, and diffuse interstitial prominence consistent with mild to moderate pulmonary interstitial edema.      Radiology reads:   CHEST 1 VIEW:   Cardiomegaly, perihilar fullness, and diffuse interstitial prominence all consistent with mild to moderate pulmonary interstitial edema.    EKG (reviewed and interpreted independently by me): The rhythm is NSR, rate 82, axis  is normal, normal intervals, no ST/T changes, normal EKG, normal sinus rhythm, there are no previous tracings available for comparison.    Consults: A Consult was obtained from the PES service to evaluate for SI. They recommend 5150 hold. Consults Crisis (PES): Called (10/24/14 1318)    Chart Review: I reviewed the patient's prior medical records.     Patient Summary:   Brianna Guzman is a 74yr old female who presents with a chief complaint of numbness of BUE and BLE and suicidal ideation. Pt was evaluated by PES who placed her on 5150 for depression and SI. Bmp and cbc unremarkable. EKG and CXR unremarkable. Pending urine studies for medical clearance.       LAST VITAL SIGNS:  Temp: 36.7 C (98.1 F) (10/24/14 1406)  Temp src: Oral (10/24/14 1406)  Pulse: 84 (10/24/14 1406)  BP: 121/68 mmHg (10/24/14 1406)  Resp: 18 (10/24/14 1152)  SpO2: 98 % (10/24/14 1406)  Weight: (not recorded)      Clinical Impression:   Depression  SI      Disposition: Observation        PRESENT ON ADMISSION:  Are any of the following four conditions present or suspected on admission: decubitus ulcer, infection from an intravascular device, infection due to an indwelling catheter, surgical site infection or pneumonia? No.    PATIENT'S GENERAL CONDITION:  Good: Vital signs are stable and within normal limits. Patient is conscious and comfortable. Indicators are excellent.     SCRIBE STATEMENT  I, Josephine Appeng, SCRIBE,  am personally taking down the notes in the presence of Dr. Lyndel Pleasure.  Electronically signed by - Julieanne Cotton Appeng, SCRIBE, Scribe  10/24/2014  13:04        SCRIBE DISCLAIMER   I, Harvest Forest, MD, personally performed the services described in this documentation, as scribed by the trained medical scribe above in my presence, and it is both accurate and complete.     Electronically signed by: Harvest Forest, MD, Resident      This patient was seen, evaluated, and care plan was developed with the resident.  I agree with  the findings and plan as outlined in our combined note. I personally independently visualized the images and tracings as noted above.  Jayme Cloud, MD      Electronically signed by: Jayme Cloud, MD, Attending Physician

## 2014-10-24 NOTE — ED Nursing Note (Signed)
Pt resting. Did ask about something to eat a little while ago. Snoring sounds heard.

## 2014-10-24 NOTE — ED Nursing Note (Signed)
Assumed care of patient at this time. First encounter.

## 2014-10-24 NOTE — Allied Health Progress (Signed)
CLINICAL SOCIAL SERVICES   CRISIS SERVICES PROGRESS NOTE  Note Date and Time: 10/24/2014    17:42  Date of Admission: 10/24/2014 11:57 AM    Patient Name: Brianna Guzman Referral Source: MD   DOB: [birth year]  Age: 36yr     Patient to be placed on a 5150 as DTS.  Full evaluation to follow.  RN notified.      Signature: Jethro Bastos, [place]       Pager # [phone]   Licensed Clinical Social Worker
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
## 2014-10-24 NOTE — ED Nursing Note (Signed)
Resident at bedside with pt.

## 2014-10-24 NOTE — ED Nursing Note (Signed)
Patient currently asleep, unlabored spontaneous respirations noted.

## 2014-10-24 NOTE — Discharge Instructions (Signed)
Thank you for choosing [facility] for your emergency health care needs. It has been our privilege to take care of you today. Your primary complaints have been evaluated based on your history, lab tests, imaging tests and you have been treated for your symptoms and discharged home. Please take all medicines that are prescribed to you as directed (see below).  It is crucial, if you have a primary care physician, to follow up with him or her in the time frame recommended as many health conditions that seem self-limited initially may actually worsen over time.  If you do not have a primary care physician, we will outline the various resources available for you to find one.    If at any time you feel that your condition is worsening, call your doctor or return to the Emergency Department for reevaluation. CALL 911 IF YOU THINK YOU ARE HAVING A MEDICAL EMERGENCY. Return to the Emergency Department if you are unable to obtain the recommended follow-up treatment or you are not better as expected. You can call the Medical Center with questions at [phone].    Please realize that the results of some studies that you had done during your stay with us (such as x-rays and cultures) have only preliminarily results at this time.  Results of these studies may change as more information becomes available or as the studies are re-evaluated by other members of our health care team in the next few days. We will attempt to contact you with any important changes or additions to the studies that were obtained today, particularly if any of these results require a change in your treatment.

## 2014-10-24 NOTE — ED Nursing Note (Addendum)
Received report from Pat Kocher and assumed care of patient. Patient in bed resting quietly. A&Ox4, GCS 15. Monitor x 3, vitals stable, respirations even and unlabored, no acute distress. No unsafe behaviors noted, patient calm and cooperative with care. Safety maintained and updated on plan of care.

## 2014-10-24 NOTE — ED Nursing Note (Signed)
Pt states she is suicidal because of losing someone that was dear to her. Pt then came back to [facility] and has been homeless for days. Feel that she would get better if placed in psychiatric institution. Came from [place], stated lived here in past. Per ems report  pt want and need  to be cleared for  shelter placement.

## 2014-10-25 DIAGNOSIS — R45851 Suicidal ideations: Secondary | ICD-10-CM | POA: Insufficient documentation

## 2014-10-25 DIAGNOSIS — F32A Depression, unspecified: Secondary | ICD-10-CM | POA: Insufficient documentation

## 2014-10-25 DIAGNOSIS — F329 Major depressive disorder, single episode, unspecified: Secondary | ICD-10-CM

## 2014-10-25 NOTE — ED Nursing Note (Signed)
Patient discharged from ED with AVS, Rx, related instructions and all belongings. Patient is in NAD, is awake/alert skin, is pink/warm/dry. Pt leaves the ED via gurney with EMS. Report given to [facility] at [facility].

## 2014-10-25 NOTE — ED Nursing Note (Signed)
Pt remains asleep, NAD

## 2014-10-25 NOTE — ED Nursing Note (Signed)
Bedding changed, pt assisted with putting on diaper, NAD

## 2014-10-25 NOTE — Case Management (ED) (Signed)
Emergency Department Psychiatric Facility Transfer Note    Patient requires 5150 hold at discharge.  5150: Date and Time written: 10-24-14  1157    Medically stable for transfer to psych facility per Dr. Rexene Edison    Name of accepting facility: [facility] [address]          Name of accepting physician: Dr. Jerrye Beavers  Telephone number: [phone]    Number to call for nurse to nurse report: [phone]    Ambulance RX: yes  Name of transport company: [employer]        Telephone number: [phone]  Level of transport: BLS  Scheduled Date and Time of Departure: 10-25-14  1600    Chart copy completed: yes  [facility] 5150: yes  Transfer Consent initiated: yes        Comments: none  Loni Beckwith RN BS ACM  Pager [phone]
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
## 2014-10-25 NOTE — ED Nursing Note (Signed)
Patient currently sitting in chair per request. Given baby powder and new brief.
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
## 2014-10-25 NOTE — ED Nursing Note (Signed)
Report to Janelle, RN.

## 2014-10-28 NOTE — Progress Notes (Signed)
PT evaluation addendum Late entry for 10-21-2014 G-codes    October 21, 2014 1356  PT Time Calculation  PT Start Time (ACUTE ONLY) 1338  PT Stop Time (ACUTE ONLY) 1349  PT Time Calculation (min) (ACUTE ONLY) 11 min  PT G-Codes **NOT FOR INPATIENT CLASS**  Functional Assessment Tool Used clinical judgement/chart review  Functional Limitation Mobility: Walking and moving around  Mobility: Walking and Moving Around Current Status [phone]) CI  Mobility: Walking and Moving Around Goal Status [phone]) CI  Mobility: Walking and Moving Around Discharge Status (U9811) CI  PT General Charges  $$ ACUTE PT VISIT 1 Procedure  PT Evaluation  $Initial PT Evaluation Tier I 1 Procedure   10/28/2014 Shannon Hurst, PT [phone]

## 2014-11-02 NOTE — Progress Notes (Signed)
OT evaluation addendum   10/05/14 1447  OT Time Calculation  OT Start Time (ACUTE ONLY) 1414  OT Stop Time (ACUTE ONLY) 1430  OT Time Calculation (min) 16 min  OT G-codes **NOT FOR INPATIENT CLASS**  Functional Assessment Tool Used clinical judgment  Functional Limitation Self care  Self Care Current Status (Z6109) CJ  Self Care Goal Status (U0454) CI  OT General Charges  $OT Visit 1 Procedure  OT Evaluation  $Initial OT Evaluation Tier I 1 Procedure    Jenell Milliner, OTR/L [phone]

## 2014-11-09 LAB — ELECTROCARDIOGRAM WITH RHYTHM STRIP: QTC: 427

## 2015-01-07 ENCOUNTER — Emergency Department (HOSPITAL_COMMUNITY): Payer: Medicare Other

## 2015-01-07 ENCOUNTER — Inpatient Hospital Stay (HOSPITAL_COMMUNITY)
Admission: EM | Admit: 2015-01-07 | Discharge: 2015-01-17 | DRG: 690 | Disposition: A | Discharge status: Skilled Nursing Facility | Payer: Medicare Other | Attending: Internal Medicine | Admitting: Internal Medicine

## 2015-01-07 ENCOUNTER — Encounter (HOSPITAL_COMMUNITY): Payer: Self-pay | Admitting: Emergency Medicine

## 2015-01-07 DIAGNOSIS — F0390 Unspecified dementia without behavioral disturbance: Secondary | ICD-10-CM | POA: Diagnosis present

## 2015-01-07 DIAGNOSIS — I480 Paroxysmal atrial fibrillation: Secondary | ICD-10-CM | POA: Diagnosis not present

## 2015-01-07 DIAGNOSIS — Z880 Allergy status to penicillin: Secondary | ICD-10-CM

## 2015-01-07 DIAGNOSIS — L89151 Pressure ulcer of sacral region, stage 1: Secondary | ICD-10-CM | POA: Diagnosis present

## 2015-01-07 DIAGNOSIS — Z7901 Long term (current) use of anticoagulants: Secondary | ICD-10-CM | POA: Diagnosis not present

## 2015-01-07 DIAGNOSIS — I959 Hypotension, unspecified: Secondary | ICD-10-CM | POA: Diagnosis not present

## 2015-01-07 DIAGNOSIS — R531 Weakness: Secondary | ICD-10-CM | POA: Diagnosis present

## 2015-01-07 DIAGNOSIS — G8929 Other chronic pain: Secondary | ICD-10-CM | POA: Diagnosis present

## 2015-01-07 DIAGNOSIS — Z882 Allergy status to sulfonamides status: Secondary | ICD-10-CM

## 2015-01-07 DIAGNOSIS — I110 Hypertensive heart disease with heart failure: Secondary | ICD-10-CM | POA: Diagnosis not present

## 2015-01-07 DIAGNOSIS — R1310 Dysphagia, unspecified: Secondary | ICD-10-CM | POA: Diagnosis not present

## 2015-01-07 DIAGNOSIS — M549 Dorsalgia, unspecified: Secondary | ICD-10-CM | POA: Diagnosis present

## 2015-01-07 DIAGNOSIS — R17 Unspecified jaundice: Secondary | ICD-10-CM | POA: Diagnosis present

## 2015-01-07 DIAGNOSIS — F489 Nonpsychotic mental disorder, unspecified: Secondary | ICD-10-CM

## 2015-01-07 DIAGNOSIS — F319 Bipolar disorder, unspecified: Secondary | ICD-10-CM | POA: Diagnosis not present

## 2015-01-07 DIAGNOSIS — Z79899 Other long term (current) drug therapy: Secondary | ICD-10-CM

## 2015-01-07 DIAGNOSIS — R29898 Other symptoms and signs involving the musculoskeletal system: Secondary | ICD-10-CM | POA: Diagnosis present

## 2015-01-07 DIAGNOSIS — Z8249 Family history of ischemic heart disease and other diseases of the circulatory system: Secondary | ICD-10-CM

## 2015-01-07 DIAGNOSIS — I5032 Chronic diastolic (congestive) heart failure: Secondary | ICD-10-CM | POA: Diagnosis not present

## 2015-01-07 DIAGNOSIS — E860 Dehydration: Secondary | ICD-10-CM | POA: Diagnosis present

## 2015-01-07 DIAGNOSIS — K219 Gastro-esophageal reflux disease without esophagitis: Secondary | ICD-10-CM | POA: Diagnosis present

## 2015-01-07 DIAGNOSIS — I10 Essential (primary) hypertension: Secondary | ICD-10-CM | POA: Diagnosis present

## 2015-01-07 DIAGNOSIS — I4891 Unspecified atrial fibrillation: Secondary | ICD-10-CM | POA: Diagnosis not present

## 2015-01-07 DIAGNOSIS — R45851 Suicidal ideations: Secondary | ICD-10-CM | POA: Diagnosis not present

## 2015-01-07 DIAGNOSIS — E869 Volume depletion, unspecified: Secondary | ICD-10-CM | POA: Diagnosis not present

## 2015-01-07 DIAGNOSIS — K449 Diaphragmatic hernia without obstruction or gangrene: Secondary | ICD-10-CM | POA: Diagnosis present

## 2015-01-07 DIAGNOSIS — I639 Cerebral infarction, unspecified: Secondary | ICD-10-CM

## 2015-01-07 DIAGNOSIS — N390 Urinary tract infection, site not specified: Principal | ICD-10-CM | POA: Diagnosis present

## 2015-01-07 DIAGNOSIS — L8990 Pressure ulcer of unspecified site, unspecified stage: Secondary | ICD-10-CM | POA: Diagnosis present

## 2015-01-07 DIAGNOSIS — I82409 Acute embolism and thrombosis of unspecified deep veins of unspecified lower extremity: Secondary | ICD-10-CM | POA: Diagnosis present

## 2015-01-07 DIAGNOSIS — Z888 Allergy status to other drugs, medicaments and biological substances status: Secondary | ICD-10-CM

## 2015-01-07 DIAGNOSIS — Z86718 Personal history of other venous thrombosis and embolism: Secondary | ICD-10-CM

## 2015-01-07 DIAGNOSIS — R748 Abnormal levels of other serum enzymes: Secondary | ICD-10-CM | POA: Diagnosis present

## 2015-01-07 HISTORY — DX: Unspecified atrial fibrillation: I48.91

## 2015-01-07 LAB — COMPREHENSIVE METABOLIC PANEL
ALK PHOS: 157 U/L — AB (ref 38–126)
ALT: 34 U/L (ref 14–54)
AST: 41 U/L (ref 15–41)
Albumin: 4.1 g/dL (ref 3.5–5.0)
Anion gap: 11 (ref 5–15)
BUN: 26 mg/dL — ABNORMAL HIGH (ref 6–20)
CALCIUM: 10.2 mg/dL (ref 8.9–10.3)
CO2: 23 mmol/L (ref 22–32)
Chloride: 102 mmol/L (ref 101–111)
Creatinine, Ser: 0.82 mg/dL (ref 0.44–1.00)
GFR calc non Af Amer: >60 mL/min (ref >60)
GLUCOSE: 139 mg/dL — AB (ref 65–99)
Potassium: 4.3 mmol/L (ref 3.5–5.1)
SODIUM: 136 mmol/L (ref 135–145)
Total Bilirubin: 2.1 mg/dL — ABNORMAL HIGH (ref 0.3–1.2)
Total Protein: 7.9 g/dL (ref 6.5–8.1)

## 2015-01-07 LAB — URINE MICROSCOPIC-ADD ON

## 2015-01-07 LAB — URINALYSIS, ROUTINE W REFLEX MICROSCOPIC
Glucose, UA: NEGATIVE mg/dL
Ketones, ur: 40 mg/dL — AB
NITRITE: NEGATIVE
Protein, ur: 100 mg/dL — AB
SPECIFIC GRAVITY, URINE: 1.03 (ref 1.005–1.030)
UROBILINOGEN UA: 1 mg/dL (ref 0.0–1.0)
pH: 7 (ref 5.0–8.0)

## 2015-01-07 LAB — CBC WITH DIFFERENTIAL/PLATELET
Basophils Absolute: 0 10*3/uL (ref 0.0–0.1)
Basophils Relative: 0 %
EOS ABS: 0 10*3/uL (ref 0.0–0.7)
Eosinophils Relative: 0 %
HCT: 40.9 % (ref 36.0–46.0)
HEMOGLOBIN: 12.6 g/dL (ref 12.0–15.0)
LYMPHS ABS: 1.9 10*3/uL (ref 0.7–4.0)
Lymphocytes Relative: 13 %
MCH: 23.5 pg — AB (ref 26.0–34.0)
MCHC: 30.8 g/dL (ref 30.0–36.0)
MCV: 76.2 fL — ABNORMAL LOW (ref 78.0–100.0)
Monocytes Absolute: 1 10*3/uL (ref 0.1–1.0)
Monocytes Relative: 7 %
NEUTROS PCT: 80 %
Neutro Abs: 11.8 10*3/uL — ABNORMAL HIGH (ref 1.7–7.7)
Platelets: 473 10*3/uL — ABNORMAL HIGH (ref 150–400)
RBC: 5.37 MIL/uL — ABNORMAL HIGH (ref 3.87–5.11)
RDW: 17.3 % — AB (ref 11.5–15.5)
WBC: 14.7 10*3/uL — ABNORMAL HIGH (ref 4.0–10.5)

## 2015-01-07 LAB — BRAIN NATRIURETIC PEPTIDE: B NATRIURETIC PEPTIDE 5: 36.4 pg/mL (ref 0.0–100.0)

## 2015-01-07 LAB — RAPID URINE DRUG SCREEN, HOSP PERFORMED
AMPHETAMINES: NOT DETECTED
BARBITURATES: NOT DETECTED
Benzodiazepines: NOT DETECTED
Cocaine: NOT DETECTED
Opiates: NOT DETECTED
Tetrahydrocannabinol: NOT DETECTED

## 2015-01-07 MED ORDER — DEXTROSE 5 % IV SOLN
1.0000 g | INTRAVENOUS | Status: DC
  Administered 2015-01-08 – 2015-01-09 (×2): 1 g via INTRAVENOUS
  Filled 2015-01-07 (×2): qty 10

## 2015-01-07 MED ORDER — ACETAMINOPHEN 325 MG PO TABS
650.0000 mg | ORAL_TABLET | Freq: Four times a day (QID) | ORAL | Status: DC | PRN
  Administered 2015-01-09 – 2015-01-17 (×8): 650 mg via ORAL
  Filled 2015-01-07 (×7): qty 2

## 2015-01-07 MED ORDER — DILTIAZEM HCL 30 MG PO TABS
30.0000 mg | ORAL_TABLET | Freq: Two times a day (BID) | ORAL | Status: DC
  Filled 2015-01-07 (×4): qty 1

## 2015-01-07 MED ORDER — SODIUM CHLORIDE 0.9 % IV BOLUS (SEPSIS)
1000.0000 mL | Freq: Once | INTRAVENOUS | Status: AC
  Administered 2015-01-07: 1000 mL via INTRAVENOUS

## 2015-01-07 MED ORDER — STROKE: EARLY STAGES OF RECOVERY BOOK
Freq: Once | Status: DC
  Filled 2015-01-07: qty 1

## 2015-01-07 MED ORDER — ENSURE ENLIVE PO LIQD
237.0000 mL | Freq: Three times a day (TID) | ORAL | Status: DC
  Administered 2015-01-08 – 2015-01-17 (×21): 237 mL via ORAL
  Filled 2015-01-07: qty 237

## 2015-01-07 MED ORDER — ONDANSETRON HCL 4 MG/2ML IJ SOLN: 4.0000 mg | Freq: Four times a day (QID) | INTRAMUSCULAR | Status: DC | PRN

## 2015-01-07 MED ORDER — QUETIAPINE FUMARATE 100 MG PO TABS
100.0000 mg | ORAL_TABLET | Freq: Every day | ORAL | Status: DC
  Administered 2015-01-07 – 2015-01-12 (×6): 100 mg via ORAL
  Filled 2015-01-07 (×6): qty 1

## 2015-01-07 MED ORDER — ENSURE PLUS PO LIQD: 237.0000 mL | Freq: Three times a day (TID) | ORAL | Status: DC

## 2015-01-07 MED ORDER — ARIPIPRAZOLE 10 MG PO TABS
10.0000 mg | ORAL_TABLET | Freq: Every day | ORAL | Status: DC
  Administered 2015-01-07 – 2015-01-12 (×6): 10 mg via ORAL
  Filled 2015-01-07 (×6): qty 1

## 2015-01-07 MED ORDER — RIVAROXABAN 15 MG PO TABS
15.0000 mg | ORAL_TABLET | Freq: Every day | ORAL | Status: DC
  Administered 2015-01-07 – 2015-01-17 (×11): 15 mg via ORAL
  Filled 2015-01-07 (×11): qty 1

## 2015-01-07 MED ORDER — METOPROLOL TARTRATE 12.5 MG HALF TABLET
12.5000 mg | ORAL_TABLET | Freq: Two times a day (BID) | ORAL | Status: DC
  Administered 2015-01-07: 12.5 mg via ORAL
  Filled 2015-01-07 (×3): qty 1

## 2015-01-07 MED ORDER — DEXTROSE 5 % IV SOLN
1.0000 g | Freq: Once | INTRAVENOUS | Status: AC
  Administered 2015-01-07: 1 g via INTRAVENOUS
  Filled 2015-01-07: qty 10

## 2015-01-07 MED ORDER — ACETAMINOPHEN 650 MG RE SUPP: 650.0000 mg | Freq: Four times a day (QID) | RECTAL | Status: DC | PRN

## 2015-01-07 MED ORDER — ONDANSETRON HCL 4 MG PO TABS: 4.0000 mg | ORAL_TABLET | Freq: Four times a day (QID) | ORAL | Status: DC | PRN

## 2015-01-07 NOTE — Progress Notes (Signed)
Discussed admission status with Dr. Rito EhrlichKrishnan.

## 2015-01-07 NOTE — ED Notes (Signed)
Attempted to call report to [facility].  [facility] & was informed pt as not been assigned yet.

## 2015-01-07 NOTE — H&P (Signed)
History and Physical  Shannon Hurst UEA:[id number] DOB: 10/24/1940 DOA: 01/07/2015  Referring physician: Marily Memos, ER physician PCP: Quitman Livings, MD   Chief Complaint: Weakness   HPI: Shannon Hurst is a 74 y.o. female  With past history of depression and DVT on Xarelto who was hospitalized in [place] for what reportedly was a suicide attempt. Patient was felt to be stable, discharged given a train ticket back to her home in [place] and then told to go to an ER there. (While she was in [place], patient lost her apartment ) Upon arrival in the emergency room today, 10/20, patient was complaining of some overall weakness and she was found to have a moderate UTI with a mild leukocytosis. She reported active suicidal ideation and hospitalists were called for further evaluation.  Patient states she can no longer walk at all. She states that she had a stroke while she was in [place].   Review of Systems:  Patient seen in the emergency room. Pt complains of not being able to walk, feeling very depressed and very weak. She states that occasionally she has trouble swallowing now. She states that both her legs can barely move. Patient states that she wants to hurt herself and when asked how, she says that she would use a coat hanger to hurt herself  Pt denies any headaches, vision changes, chest pain, palpitations, shortness of breath, wheeze, cough, abdominal pain, hematuria, dysuria, constipation, diarrhea,, focal extremity numbness or pain.  Review of systems are otherwise negative  Past Medical History  Diagnosis Date  . GERD (gastroesophageal reflux disease)   . Mood disorder (HCC)   . Depression   . Difficulty swallowing   . Hypertension   . H/O suicide attempt     attempted to hang self  . DVT (deep venous thrombosis) (HCC)   . Atrial fibrillation [facility])    Past Surgical History  Procedure Laterality Date  . Hip surgery    . Arm surgery     . Abdominal  hysterectomy     Social History:  reports that she has never smoked. She does not have any smokeless tobacco history on file. She reports that she does not drink alcohol or use illicit drugs. Patient lost her residence here in [place] & now states she's not able to walk  Allergies  Allergen Reactions  . Dalmane [Flurazepam Hcl] Rash  . Penicillins Rash    Has patient had a PCN reaction causing immediate rash, facial/tongue/throat swelling, SOB or lightheadedness with hypotension: Yes Has patient had a PCN reaction causing severe rash involving mucus membranes or skin necrosis: Yes Has patient had a PCN reaction that required hospitalization Has patient had a PCN reaction occurring within the last 10 years:  If all of the above answers are "NO", then may proceed with Cephalosporin use.   . Sulfa Antibiotics Rash    Family History  Problem Relation Age of Onset  . Heart attack Mother       Prior to Admission medications   Medication Sig Start Date End Date Taking? Authorizing Provider  acetaminophen (TYLENOL) 160 MG/5ML suspension Take 480 mg by mouth 2 (two) times daily as needed. For pain.   Yes Historical Provider, MD  ARIPiprazole (ABILIFY) 10 MG tablet Take 1 tablet (10 mg total) by mouth daily. 10/05/14  Yes Ripudeep Jenna Luo, MD  diltiazem (CARDIZEM) 30 MG tablet Take 1 tablet (30 mg total) by mouth 2 (two) times daily. 10/06/14  Yes Ripudeep Jenna Luo, MD  Ensure  Plus (ENSURE PLUS) LIQD Take 237 mLs by mouth 3 (three) times daily between meals.   Yes Historical Provider, MD  QUEtiapine (SEROQUEL) 100 MG tablet Take 100 mg by mouth daily. 12/30/14  Yes Historical Provider, MD  Rivaroxaban (XARELTO) 15 MG TABS tablet Take 1 tablet (15 mg total) by mouth daily with supper. 10/05/14  Yes Ripudeep Jenna LuoK Rai, MD    Physical Exam: BP 134/60 mmHg  Pulse 95  Temp(Src) 98.3 F (36.8 C) (Oral)  Resp 15  SpO2 95%  General:  Alert and oriented 2, no acute distress Eyes: Sclera nonicteric,  extraocular movements are intact ENT: Normocephalic, atraumatic, mucous members are slightly dry Neck: No JVD Cardiovascular: Irregular rhythm, borderline tachycardia Respiratory: Clear to auscultation bilaterally Abdomen: Soft, nontender, nondistended, positive bowel sounds Skin: No skin breaks, tears or lesions Musculoskeletal: No clubbing or cyanosis or edema Psychiatric: Patient interacts well. No flattened affect. She continues to state that she does want to hurt herself Neurologic: Cranial nerves II through XII are intact. Upper extremities have some generalized nonspecific nonsymmetrical weakness. Lower extremities from the knees down note of 3/5 in regards to flexion and extension           Labs on Admission:  Basic Metabolic Panel:  Recent Labs Lab 01/07/15 1018  NA 136  K 4.3  CL 102  CO2 23  GLUCOSE 139*  BUN 26*  CREATININE 0.82  CALCIUM 10.2   Liver Function Tests:  Recent Labs Lab 01/07/15 1018  AST 41  ALT 34  ALKPHOS 157*  BILITOT 2.1*  PROT 7.9  ALBUMIN 4.1   No results for input(s): LIPASE, AMYLASE in the last 168 hours. No results for input(s): AMMONIA in the last 168 hours. CBC:  Recent Labs Lab 01/07/15 1018  WBC 14.7*  NEUTROABS 11.8*  HGB 12.6  HCT 40.9  MCV 76.2*  PLT 473*   Cardiac Enzymes: No results for input(s): CKTOTAL, CKMB, CKMBINDEX, TROPONINI in the last 168 hours.  BNP (last 3 results)  Recent Labs  10/01/14 2100 10/02/14 0905 01/07/15 1018  BNP 46.8 52.3 36.4    ProBNP (last 3 results) No results for input(s): PROBNP in the last 8760 hours.  CBG: No results for input(s): GLUCAP in the last 168 hours.  Radiological Exams on Admission: Dg Chest 2 View  01/07/2015  CLINICAL DATA:  Shortness of breath.  Evaluate for pneumonia EXAM: CHEST  2 VIEW COMPARISON:  10/01/2014 FINDINGS: Large hiatal hernia with similar appearance to prior. Hyperinflation, chronic. There is no edema, consolidation, effusion, or  pneumothorax. Normal heart size and stable aortic tortuosity. IMPRESSION: No acute finding. Large hiatal hernia. Electronically Signed   By: Marnee SpringJonathon  Watts M.D.   On: 01/07/2015 11:23    EKG: Independently reviewed. Looks like multifocal atrial tachycardia  Assessment/Plan Present on Admission:  . UTI (lower urinary tract infection): IV Rocephin, recheck CBC in the morning  . Chronic diastolic heart failure (HCC): Strict input and output, no additional IV fluids  . Feeling suicidal: Consult psychiatry. Sitter.  Marland Kitchen. HTN (hypertension): Added beta blocker  . Bipolar disorder (HCC): Continue Abilify and Seroquel  . DVT (deep venous thrombosis) (HCC): Continue Xarelto  . A-fib (HCC) paroxysmal: On Xarelto Lower extremity weakness: Patient states that she had a stroke while in [place] and now she cannot walk at all. She is only on Xarelto, so will initiate stroke workup with echocardiogram, A1c, MRI and lipids. PT and OT to see. Since she does complain of some swallowing issues at times,  will check bedside swallow eval before allowing TEE  Consultants: Psychiatry  Code Status: Full code  Family Communication: Patient states she has no living relatives  Disposition Plan: Anticipate short stay and likely will be cleared medically quickly, needs behavioral health assessment. She likely will need to go to a skilled nursing facility after  Time spent: 40 minutes  Hollice Espy [facility] Pager [phone]

## 2015-01-07 NOTE — ED Provider Notes (Signed)
CSN: [id number]     Arrival date & time 01/07/15  0941 History   First MD Initiated Contact with Patient 01/07/15 [phone]     Chief Complaint  Patient presents with  . Weakness  . Suicidal     (Consider location/radiation/quality/duration/timing/severity/associated sxs/prior Treatment) HPI Comments: 74 year old female here from [place] where she was in a psychiatric hospital for 2 months and the discharge and told to come here to be admitted again. She also states that she has lower show any swelling, overall weakness, difficulty swallowing, vomiting, urinary frequency and dysuria, and fevers.  Patient is a 74 y.o. female presenting with weakness.  Weakness This is a new problem. The problem occurs constantly. Associated symptoms include abdominal pain. Pertinent negatives include no chest pain and no shortness of breath. Nothing aggravates the symptoms. Nothing relieves the symptoms. She has tried nothing for the symptoms.    Past Medical History  Diagnosis Date  . GERD (gastroesophageal reflux disease)   . Mood disorder (HCC)   . Depression   . Difficulty swallowing   . Hypertension   . H/O suicide attempt     attempted to hang self  . DVT (deep venous thrombosis) (HCC)   . Atrial fibrillation [facility])    Past Surgical History  Procedure Laterality Date  . Hip surgery    . Arm surgery     . Abdominal hysterectomy     Family History  Problem Relation Age of Onset  . Heart attack Mother    Social History  Substance Use Topics  . Smoking status: Never Smoker   . Smokeless tobacco: None  . Alcohol Use: No   OB History    No data available     Review of Systems  Constitutional: Positive for fever. Negative for chills.  HENT: Negative for ear pain.   Eyes: Negative for photophobia and pain.  Respiratory: Negative for cough and shortness of breath.   Cardiovascular: Positive for leg swelling. Negative for chest pain.  Gastrointestinal: Positive for abdominal pain.   Endocrine: Negative for polyuria.  Genitourinary: Positive for dysuria. Negative for urgency and difficulty urinating.  Skin: Negative for rash.  Neurological: Positive for weakness.  All other systems reviewed and are negative.     Allergies  Dalmane; Penicillins; and Sulfa antibiotics  Home Medications   Prior to Admission medications   Medication Sig Start Date End Date Taking? Authorizing Provider  acetaminophen (TYLENOL) 160 MG/5ML suspension Take 480 mg by mouth 2 (two) times daily as needed. For pain.   Yes Historical Provider, MD  ARIPiprazole (ABILIFY) 10 MG tablet Take 1 tablet (10 mg total) by mouth daily. 10/05/14  Yes Ripudeep Jenna Luo, MD  diltiazem (CARDIZEM) 30 MG tablet Take 1 tablet (30 mg total) by mouth 2 (two) times daily. 10/06/14  Yes Ripudeep Jenna Luo, MD  Ensure Plus (ENSURE PLUS) LIQD Take 237 mLs by mouth 3 (three) times daily between meals.   Yes Historical Provider, MD  QUEtiapine (SEROQUEL) 100 MG tablet Take 100 mg by mouth daily. 12/30/14  Yes Historical Provider, MD  Rivaroxaban (XARELTO) 15 MG TABS tablet Take 1 tablet (15 mg total) by mouth daily with supper. 10/05/14  Yes Ripudeep K Rai, MD   BP 105/53 mmHg  Pulse 101  Temp(Src) 97.9 F (36.6 C) (Oral)  Resp 16  SpO2 98% Physical Exam  Constitutional: She is oriented to person, place, and time. She appears well-developed and well-nourished.  HENT:  Head: Normocephalic and atraumatic.  Eyes: Conjunctivae and EOM  are normal. Right eye exhibits no discharge. Left eye exhibits no discharge.  Cardiovascular: Normal rate and regular rhythm.   Pulmonary/Chest: Effort normal and breath sounds normal. No respiratory distress.  Abdominal: Soft. She exhibits no distension. There is no tenderness. There is no rebound.  Musculoskeletal: Normal range of motion. She exhibits no edema or tenderness.  Neurological: She is alert and oriented to person, place, and time.  Normal strength and pain perception in distal  extremities with non pitting edema  Skin: Skin is warm and dry.  Nursing note and vitals reviewed.   ED Course  Procedures (including critical care time) Labs Review Labs Reviewed  CBC WITH DIFFERENTIAL/PLATELET - Abnormal; Notable for the following:    WBC 14.7 (*)    RBC 5.37 (*)    MCV 76.2 (*)    MCH 23.5 (*)    RDW 17.3 (*)    Platelets 473 (*)    Neutro Abs 11.8 (*)    All other components within normal limits  COMPREHENSIVE METABOLIC PANEL - Abnormal; Notable for the following:    Glucose, Bld 139 (*)    BUN 26 (*)    Alkaline Phosphatase 157 (*)    Total Bilirubin 2.1 (*)    All other components within normal limits  URINALYSIS, ROUTINE W REFLEX MICROSCOPIC (NOT AT [facility]) - Abnormal; Notable for the following:    Color, Urine AMBER (*)    APPearance TURBID (*)    Hgb urine dipstick SMALL (*)    Bilirubin Urine MODERATE (*)    Ketones, ur 40 (*)    Protein, ur 100 (*)    Leukocytes, UA LARGE (*)    All other components within normal limits  URINE MICROSCOPIC-ADD ON - Abnormal; Notable for the following:    Squamous Epithelial / LPF FEW (*)    Bacteria, UA MANY (*)    All other components within normal limits  CBC - Abnormal; Notable for the following:    Hemoglobin 10.2 (*)    HCT 33.5 (*)    MCV 77.5 (*)    MCH 23.6 (*)    RDW 17.3 (*)    All other components within normal limits  URINE CULTURE  BRAIN NATRIURETIC PEPTIDE  URINE RAPID DRUG SCREEN, HOSP PERFORMED  BASIC METABOLIC PANEL  LIPID PANEL  HEMOGLOBIN A1C  LACTIC ACID, PLASMA  LACTIC ACID, PLASMA    Imaging Review Dg Chest 2 View  01/07/2015  CLINICAL DATA:  Shortness of breath.  Evaluate for pneumonia EXAM: CHEST  2 VIEW COMPARISON:  10/01/2014 FINDINGS: Large hiatal hernia with similar appearance to prior. Hyperinflation, chronic. There is no edema, consolidation, effusion, or pneumothorax. Normal heart size and stable aortic tortuosity. IMPRESSION: No acute finding. Large hiatal hernia.  Electronically Signed   By: Marnee Spring M.D.   On: 01/07/2015 11:23   Mr Brain Wo Contrast  01/08/2015  CLINICAL DATA:  CVA questioned due to confusion. Generalized weakness. EXAM: MRI HEAD WITHOUT CONTRAST TECHNIQUE: Multiplanar, multiecho pulse sequences of the brain and surrounding structures were obtained without intravenous contrast. COMPARISON:  Head CT 03/03/2010 FINDINGS: Motion degraded but overall diagnostic. Calvarium and upper cervical spine: No focal marrow signal abnormality. Orbits: No significant findings. Sinuses and Mastoids: Mucosal thickening within right concha bullosa. No acute finding. Brain: No acute infarct, hemorrhage, hydrocephalus, or mass lesion. No evidence of large vessel occlusion. No unusual/focal atrophy or ischemic change for age. No ventriculomegaly or extra-axial fluid collection. Borderline enlargement of the pituitary gland without heterogeneity to suggest  mass. IMPRESSION: Normal brain MRI for age. Electronically Signed   By: Marnee SpringJonathon  Watts M.D.   On: 01/08/2015 07:15   I have personally reviewed and evaluated these images and lab results as part of my medical decision-making.   EKG Interpretation   Date/Time:  Thursday January 07 2015 09:55:35 EDT Ventricular Rate:  104 PR Interval:  117 QRS Duration: 79 QT Interval:  365 QTC Calculation: 480 R Axis:   43 Text Interpretation:  Sinus tachycardia Atrial premature complex Posterior  infarct, old Artifact in lead(s) I II III aVR aVL aVF V5 Confirmed by  Maridel Pixler MD, Barbara CowerJASON [phone]([id number]) on 01/07/2015 10:30:16 AM      MDM   Final Diagnoses: cysititis without complication, unknown organism Suicidal thoughts Weakness Sepsis  Strong urine odor, will eval for UTI and othe rinfections. Fluid hydration in mean time. If infection or dehydration will admit for medical clearance, if all negative, will consult tts.  Patient septic from UTI, rocephin given. Fluids continued. Medicine consulted and agrees with  admission.     Marily MemosJason Vail Vuncannon, MD 01/08/15 [phone]

## 2015-01-07 NOTE — Progress Notes (Signed)
Utilization Review completed.  Camdon Saetern RN CM  

## 2015-01-07 NOTE — ED Notes (Signed)
Patient transported to X-ray 

## 2015-01-07 NOTE — ED Notes (Signed)
MD at bedside. 

## 2015-01-07 NOTE — ED Notes (Signed)
Pt drank po fluids and ate applesauce with no coughing.

## 2015-01-07 NOTE — ED Notes (Addendum)
Per EMS. [place]. Pt reports she has been having lower extremity weakness for the past 2 months. Pt reports she was seen in [place] for this, and was also recently in a psychiatric facility. Was told to come to a hospital in [place] once she got home. Pt was able to stand and pivot with assist with EMS. Pt has a walker. Also reports lower extremity edema. Pt reports pain all over. Pt then stated she has had suicidal thoughts and was just discharged from a psychiatric facility for this. Pt was told to come here for her suicidal thought by the hospital in [place]. Pt then goes on to say that she cannot swallow due to a hiatal hernia and she is paralyzed although she is moving both her feet. Pt complains of bilateral foot pain. Pt also adds she was diagnosed with a stroke in [place].

## 2015-01-07 NOTE — Progress Notes (Signed)
[facility].  EDCM spoke to patient at bedside.  Patient presents to the Ed with increased weakness, and SI.  ED workup revealed UTI, WBC 14.3, temp 99.5.  Patient reports she is homeless.  She reports she went away for 2 months in [place] where she reportedly had a stroke.  When patient returned to her apartment in [place], "My landlord made me move out of my apartment.  She wasn't very nice to me."  She reports she has just returned from [place] today.  Patient reports, "all of family is dead.  I have no one."  Patient reports she has a nephew, Malen Gauzeim Littendorf, but she doesn't want to call him, "Well maybe I should call him to let him know I'm here. But I really don't want too."  Patient uses a rolator to ambulate.  Rolator noted to be at patient's bedside in the ED.  Patient reports, "I need a new one.  I fall over that one."  Patient reports she cannot walk.  Patient reports she had a private duty health aide named Marisue Humbleracy Hicks from an agency out in [place]. French Anaracy would help patient with her food shopping.  Patient cannot remember the agency name.  She reports, "I need to get my SSI back.  [facility] informed [facility] that she will have to contact the DSS for her SSI.  Patient reports she is able to wash, dress and feed herself.  Patient reports she is homeless.  EDSW consulted for homeless issues/placement issues.  No further EDCM needs at this time.

## 2015-01-07 NOTE — ED Notes (Signed)
Bed: [id number] Expected date:  Expected time:  Means of arrival:  Comments: Ems- elderly weakness

## 2015-01-08 ENCOUNTER — Observation Stay (HOSPITAL_COMMUNITY): Payer: Medicare Other

## 2015-01-08 DIAGNOSIS — I6789 Other cerebrovascular disease: Secondary | ICD-10-CM

## 2015-01-08 DIAGNOSIS — R45851 Suicidal ideations: Secondary | ICD-10-CM | POA: Diagnosis present

## 2015-01-08 DIAGNOSIS — I110 Hypertensive heart disease with heart failure: Secondary | ICD-10-CM | POA: Diagnosis present

## 2015-01-08 DIAGNOSIS — L89151 Pressure ulcer of sacral region, stage 1: Secondary | ICD-10-CM | POA: Diagnosis present

## 2015-01-08 DIAGNOSIS — R17 Unspecified jaundice: Secondary | ICD-10-CM | POA: Diagnosis present

## 2015-01-08 DIAGNOSIS — G8929 Other chronic pain: Secondary | ICD-10-CM | POA: Diagnosis present

## 2015-01-08 DIAGNOSIS — F319 Bipolar disorder, unspecified: Secondary | ICD-10-CM

## 2015-01-08 DIAGNOSIS — I4891 Unspecified atrial fibrillation: Secondary | ICD-10-CM | POA: Diagnosis present

## 2015-01-08 DIAGNOSIS — F0390 Unspecified dementia without behavioral disturbance: Secondary | ICD-10-CM | POA: Diagnosis present

## 2015-01-08 DIAGNOSIS — K219 Gastro-esophageal reflux disease without esophagitis: Secondary | ICD-10-CM | POA: Diagnosis present

## 2015-01-08 DIAGNOSIS — E869 Volume depletion, unspecified: Secondary | ICD-10-CM | POA: Diagnosis present

## 2015-01-08 DIAGNOSIS — I5032 Chronic diastolic (congestive) heart failure: Secondary | ICD-10-CM | POA: Diagnosis not present

## 2015-01-08 DIAGNOSIS — R531 Weakness: Secondary | ICD-10-CM | POA: Diagnosis present

## 2015-01-08 DIAGNOSIS — M549 Dorsalgia, unspecified: Secondary | ICD-10-CM | POA: Diagnosis present

## 2015-01-08 DIAGNOSIS — N390 Urinary tract infection, site not specified: Secondary | ICD-10-CM | POA: Diagnosis present

## 2015-01-08 DIAGNOSIS — Z8249 Family history of ischemic heart disease and other diseases of the circulatory system: Secondary | ICD-10-CM | POA: Diagnosis not present

## 2015-01-08 DIAGNOSIS — I959 Hypotension, unspecified: Secondary | ICD-10-CM | POA: Diagnosis present

## 2015-01-08 DIAGNOSIS — E860 Dehydration: Secondary | ICD-10-CM | POA: Diagnosis present

## 2015-01-08 DIAGNOSIS — R748 Abnormal levels of other serum enzymes: Secondary | ICD-10-CM | POA: Diagnosis present

## 2015-01-08 DIAGNOSIS — Z7901 Long term (current) use of anticoagulants: Secondary | ICD-10-CM | POA: Diagnosis not present

## 2015-01-08 DIAGNOSIS — F489 Nonpsychotic mental disorder, unspecified: Secondary | ICD-10-CM | POA: Diagnosis not present

## 2015-01-08 LAB — CBC
HEMATOCRIT: 33.5 % — AB (ref 36.0–46.0)
HEMOGLOBIN: 10.2 g/dL — AB (ref 12.0–15.0)
MCH: 23.6 pg — AB (ref 26.0–34.0)
MCHC: 30.4 g/dL (ref 30.0–36.0)
MCV: 77.5 fL — AB (ref 78.0–100.0)
Platelets: 379 10*3/uL (ref 150–400)
RBC: 4.32 MIL/uL (ref 3.87–5.11)
RDW: 17.3 % — ABNORMAL HIGH (ref 11.5–15.5)
WBC: 8.6 10*3/uL (ref 4.0–10.5)

## 2015-01-08 LAB — LIPID PANEL
CHOL/HDL RATIO: 2.9 ratio
Cholesterol: 144 mg/dL (ref 0–200)
HDL: 50 mg/dL (ref >40)
LDL Cholesterol: 79 mg/dL (ref 0–99)
TRIGLYCERIDES: 75 mg/dL (ref <150)
VLDL: 15 mg/dL (ref 0–40)

## 2015-01-08 LAB — BASIC METABOLIC PANEL
ANION GAP: 5 (ref 5–15)
BUN: 16 mg/dL (ref 6–20)
CHLORIDE: 105 mmol/L (ref 101–111)
CO2: 27 mmol/L (ref 22–32)
Calcium: 9.3 mg/dL (ref 8.9–10.3)
Creatinine, Ser: 0.63 mg/dL (ref 0.44–1.00)
GFR calc Af Amer: >60 mL/min (ref >60)
GFR calc non Af Amer: >60 mL/min (ref >60)
GLUCOSE: 94 mg/dL (ref 65–99)
POTASSIUM: 3.5 mmol/L (ref 3.5–5.1)
Sodium: 137 mmol/L (ref 135–145)

## 2015-01-08 LAB — LACTIC ACID, PLASMA
Lactic Acid, Venous: 1 mmol/L (ref 0.5–2.0)
Lactic Acid, Venous: 1.4 mmol/L (ref 0.5–2.0)

## 2015-01-08 MED ORDER — SODIUM CHLORIDE 0.9 % IV BOLUS (SEPSIS)
500.0000 mL | Freq: Once | INTRAVENOUS | Status: AC
  Administered 2015-01-08: 500 mL via INTRAVENOUS

## 2015-01-08 MED ORDER — METOPROLOL TARTRATE 12.5 MG HALF TABLET
12.5000 mg | ORAL_TABLET | Freq: Two times a day (BID) | ORAL | Status: DC
  Administered 2015-01-08 – 2015-01-10 (×4): 12.5 mg via ORAL
  Filled 2015-01-08 (×6): qty 1

## 2015-01-08 NOTE — Progress Notes (Signed)
Nutrition Brief Note  Patient identified on the Malnutrition Screening Tool (MST) Report  Wt Readings from Last 15 Encounters:  10/06/14 102 lb 9.6 oz (46.539 kg)  07/02/14 102 lb (46.267 kg)  03/28/11 90 lb (40.824 kg)    There is no weight on file to calculate BMI.   Current diet order is 2 gm Na, patient is consuming approximately 75% of meals at this time. Labs and medications reviewed.   Pt recently became homeless since [place]. Per review, her weight has been stable x6 months with no weight hx prior to this since 2013. Ensure Shiela Mayernlive has already been ordered BID.  No further nutrition interventions warranted at this time. If nutrition issues arise, please consult RD.      Trenton GammonJessica Sewell Pitner, RD, LDN Inpatient Clinical Dietitian Pager # [phone] After hours/weekend pager # [phone]

## 2015-01-08 NOTE — Consult Note (Signed)
[facility] Consult   Reason for Consult:  Bipolar and suicide ideation Referring Physician:  Dr. Frederic Jericho Patient Identification: Shannon Hurst MRN:  [id number] Principal Diagnosis: Suicidal ideation Diagnosis:   Patient Active Problem List   Diagnosis Date Noted  . UTI (lower urinary tract infection) [N39.0] 01/07/2015  . Chronic diastolic heart failure ([place]) [I50.32] 01/07/2015  . Feeling suicidal [F48.9] 01/07/2015  . Lower extremity weakness [R29.898] 01/07/2015  . Suicidal ideation [R45.851] 01/07/2015  . Atrial fibrillation with rapid ventricular response ([place]) [I48.91] 10/02/2014  . Bipolar disorder ([place]) [F31.9] 10/02/2014  . HTN (hypertension) [I10] 10/02/2014  . Scab [R23.4] 10/02/2014  . (HFpEF) heart failure with preserved ejection fraction ([place]) [I50.30] 10/02/2014  . Hypertension [I10] 10/02/2014  . DVT (deep venous thrombosis) ([place]) [I82.409] 10/02/2014  . A-fib ([place]) [I48.91] 10/02/2014  . GERD (gastroesophageal reflux disease) [K21.9]   . Mood disorder ([place]) [F39]     Total Time spent with patient: 1 hour  Subjective:   Shannon Hurst is a 74 y.o. female patient admitted with Bipolar and suicide ideation.  HPI:  Shannon Hurst is a 74 y.o. female seen and chart reviewed. Patient complained of increased nerves, depression and suicide ideations. Reportedly she has been sick emotionally and having suicide thoughts and asking to be placed in mental health hospital. She can not walk and has poor ADL's. She was hospitalized while in [place], [place] for two months and came back by bus and than came to [facility].   Medical history: patient w ith past history of depression and DVT on Xarelto who was hospitalized in [place] for what reportedly was a suicide attempt. Patient was felt to be stable, discharged given a train ticket back to her home in [place] and then told to go to an ER there. (While she was in [place], patient lost her  apartment ) Upon arrival in the emergency room today, 10/20, patient was complaining of some overall weakness and she was found to have a moderate UTI with a mild leukocytosis. She reported active suicidal ideation and hospitalists were called for further evaluation.Patient states she can no longer walk at all. She states that she had a stroke while she was in [place].   Past Psychiatric History: She has history of bipolar disorder and and multiple acute psych admission.  Risk to Self: Is patient at risk for suicide?: Yes Risk to Others:   Prior Inpatient Therapy:   Prior Outpatient Therapy:    Past Medical History:  Past Medical History  Diagnosis Date  . GERD (gastroesophageal reflux disease)   . Mood disorder ([place])   . Depression   . Difficulty swallowing   . Hypertension   . H/O suicide attempt     attempted to hang self  . DVT (deep venous thrombosis) ([place])   . Atrial fibrillation [facility])     Past Surgical History  Procedure Laterality Date  . Hip surgery    . Arm surgery     . Abdominal hysterectomy     Family History:  Family History  Problem Relation Age of Onset  . Heart attack Mother    Family Psychiatric  History: unknown Social History:  History  Alcohol Use No     History  Drug Use No    Social History   Social History  . Marital Status: Divorced    Spouse Name: N/A  . Number of Children: N/A  . Years of Education: N/A   Social History Main Topics  . Smoking  status: Never Smoker   . Smokeless tobacco: None  . Alcohol Use: No  . Drug Use: No  . Sexual Activity: No   Other Topics Concern  . None   Social History Narrative   Lives in a group home      Kennyth Lose, case coordinator, [phone]   [place], aid at home      [facility] - for her [facility]   [place] Payment yesterday to say her lights were not on (no $ for light bill) and told Kennyth Lose she was going somewhere but would not say where. Called Kennyth Lose today to say she was in the  hospital. Per Kennyth Lose, "exaggerates and lies"      Hx of Dementia      Tiffany cooks for her   Needs Help w/ shower/bathroom   No drive - walker and bus    HCPA herself, no living will   Additional Social History:                          Allergies:   Allergies  Allergen Reactions  . Dalmane [Flurazepam Hcl] Rash  . Penicillins Rash    Has patient had a PCN reaction causing immediate rash, facial/tongue/throat swelling, SOB or lightheadedness with hypotension: Yes Has patient had a PCN reaction causing severe rash involving mucus membranes or skin necrosis: Yes Has patient had a PCN reaction that required hospitalization Has patient had a PCN reaction occurring within the last 10 years:  If all of the above answers are "NO", then may proceed with Cephalosporin use.   . Sulfa Antibiotics Rash    Labs:  Results for orders placed or performed during the hospital encounter of 01/07/15 (from the past 48 hour(s))  CBC with Differential     Status: Abnormal   Collection Time: 01/07/15 10:18 AM  Result Value Ref Range   WBC 14.7 (H) 4.0 - 10.5 K/uL   RBC 5.37 (H) 3.87 - 5.11 MIL/uL   Hemoglobin 12.6 12.0 - 15.0 g/dL   HCT 40.9 36.0 - 46.0 %   MCV 76.2 (L) 78.0 - 100.0 fL   MCH 23.5 (L) 26.0 - 34.0 pg   MCHC 30.8 30.0 - 36.0 g/dL   RDW 17.3 (H) 11.5 - 15.5 %   Platelets 473 (H) 150 - 400 K/uL   Neutrophils Relative % 80 %   Neutro Abs 11.8 (H) 1.7 - 7.7 K/uL   Lymphocytes Relative 13 %   Lymphs Abs 1.9 0.7 - 4.0 K/uL   Monocytes Relative 7 %   Monocytes Absolute 1.0 0.1 - 1.0 K/uL   Eosinophils Relative 0 %   Eosinophils Absolute 0.0 0.0 - 0.7 K/uL   Basophils Relative 0 %   Basophils Absolute 0.0 0.0 - 0.1 K/uL  Comprehensive metabolic panel     Status: Abnormal   Collection Time: 01/07/15 10:18 AM  Result Value Ref Range   Sodium 136 135 - 145 mmol/L   Potassium 4.3 3.5 - 5.1 mmol/L   Chloride 102 101 - 111 mmol/L   CO2 23 22 - 32 mmol/L   Glucose, Bld 139  (H) 65 - 99 mg/dL   BUN 26 (H) 6 - 20 mg/dL   Creatinine, Ser 0.82 0.44 - 1.00 mg/dL   Calcium 10.2 8.9 - 10.3 mg/dL   Total Protein 7.9 6.5 - 8.1 g/dL   Albumin 4.1 3.5 - 5.0 g/dL   AST 41 15 - 41 U/L   ALT 34 14 -  54 U/L   Alkaline Phosphatase 157 (H) 38 - 126 U/L   Total Bilirubin 2.1 (H) 0.3 - 1.2 mg/dL   GFR calc non Af Amer >60 >60 mL/min   GFR calc Af Amer >60 >60 mL/min    Comment: (NOTE) The eGFR has been calculated using the CKD EPI equation. This calculation has not been validated in all clinical situations. eGFR's persistently <60 mL/min signify possible Chronic Kidney Disease.    Anion gap 11 5 - 15  Brain natriuretic peptide     Status: None   Collection Time: 01/07/15 10:18 AM  Result Value Ref Range   B Natriuretic Peptide 36.4 0.0 - 100.0 pg/mL  Urinalysis, Routine w reflex microscopic (not at [facility])     Status: Abnormal   Collection Time: 01/07/15 10:32 AM  Result Value Ref Range   Color, Urine AMBER (A) YELLOW    Comment: BIOCHEMICALS MAY BE AFFECTED BY COLOR   APPearance TURBID (A) CLEAR   Specific Gravity, Urine 1.030 1.005 - 1.030   pH 7.0 5.0 - 8.0   Glucose, UA NEGATIVE NEGATIVE mg/dL   Hgb urine dipstick SMALL (A) NEGATIVE   Bilirubin Urine MODERATE (A) NEGATIVE   Ketones, ur 40 (A) NEGATIVE mg/dL   Protein, ur 100 (A) NEGATIVE mg/dL   Urobilinogen, UA 1.0 0.0 - 1.0 mg/dL   Nitrite NEGATIVE NEGATIVE   Leukocytes, UA LARGE (A) NEGATIVE  Urine microscopic-add on     Status: Abnormal   Collection Time: 01/07/15 10:32 AM  Result Value Ref Range   Squamous Epithelial / LPF FEW (A) RARE   WBC, UA TOO NUMEROUS TO COUNT <3 WBC/hpf   RBC / HPF 3-6 <3 RBC/hpf   Bacteria, UA MANY (A) RARE   Urine-Other MUCOUS PRESENT   Urine rapid drug screen (hosp performed)     Status: None   Collection Time: 01/07/15 10:32 AM  Result Value Ref Range   Opiates NONE DETECTED NONE DETECTED   Cocaine NONE DETECTED NONE DETECTED   Benzodiazepines NONE DETECTED NONE  DETECTED   Amphetamines NONE DETECTED NONE DETECTED   Tetrahydrocannabinol NONE DETECTED NONE DETECTED   Barbiturates NONE DETECTED NONE DETECTED    Comment:        DRUG SCREEN FOR MEDICAL PURPOSES ONLY.  IF CONFIRMATION IS NEEDED FOR ANY PURPOSE, NOTIFY LAB WITHIN 5 DAYS.        LOWEST DETECTABLE LIMITS FOR URINE DRUG SCREEN Drug Class       Cutoff (ng/mL) Amphetamine      1000 Barbiturate      200 Benzodiazepine   161 Tricyclics       096 Opiates          300 Cocaine          300 THC              50   CBC     Status: Abnormal   Collection Time: 01/08/15  5:26 AM  Result Value Ref Range   WBC 8.6 4.0 - 10.5 K/uL   RBC 4.32 3.87 - 5.11 MIL/uL   Hemoglobin 10.2 (L) 12.0 - 15.0 g/dL   HCT 33.5 (L) 36.0 - 46.0 %   MCV 77.5 (L) 78.0 - 100.0 fL   MCH 23.6 (L) 26.0 - 34.0 pg   MCHC 30.4 30.0 - 36.0 g/dL   RDW 17.3 (H) 11.5 - 15.5 %   Platelets 379 150 - 400 K/uL  Basic metabolic panel     Status: None   Collection  Time: 01/08/15  5:26 AM  Result Value Ref Range   Sodium 137 135 - 145 mmol/L   Potassium 3.5 3.5 - 5.1 mmol/L    Comment: DELTA CHECK NOTED REPEATED TO VERIFY    Chloride 105 101 - 111 mmol/L   CO2 27 22 - 32 mmol/L   Glucose, Bld 94 65 - 99 mg/dL   BUN 16 6 - 20 mg/dL   Creatinine, Ser 0.63 0.44 - 1.00 mg/dL   Calcium 9.3 8.9 - 10.3 mg/dL   GFR calc non Af Amer >60 >60 mL/min   GFR calc Af Amer >60 >60 mL/min    Comment: (NOTE) The eGFR has been calculated using the CKD EPI equation. This calculation has not been validated in all clinical situations. eGFR's persistently <60 mL/min signify possible Chronic Kidney Disease.    Anion gap 5 5 - 15  Lipid panel     Status: None   Collection Time: 01/08/15  5:26 AM  Result Value Ref Range   Cholesterol 144 0 - 200 mg/dL   Triglycerides 75 <150 mg/dL   HDL 50 >40 mg/dL   Total CHOL/HDL Ratio 2.9 RATIO   VLDL 15 0 - 40 mg/dL   LDL Cholesterol 79 0 - 99 mg/dL    Comment:        Total Cholesterol/HDL:CHD  Risk Coronary Heart Disease Risk Table                     Men   Women  1/2 Average Risk   3.4   3.3  Average Risk       5.0   4.4  2 X Average Risk   9.6   7.1  3 X Average Risk  23.4   11.0        Use the calculated Patient Ratio above and the CHD Risk Table to determine the patient's CHD Risk.        ATP III CLASSIFICATION (LDL):  <100     mg/dL   Optimal  100-129  mg/dL   Near or Above                    Optimal  130-159  mg/dL   Borderline  160-189  mg/dL   High  >190     mg/dL   Very High Performed at [facility]   Lactic acid, plasma     Status: None   Collection Time: 01/08/15  8:34 AM  Result Value Ref Range   Lactic Acid, Venous 1.0 0.5 - 2.0 mmol/L    Current Facility-Administered Medications  Medication Dose Route Frequency Provider Last Rate Last Dose  .  stroke: mapping our early stages of recovery book   Does not apply Once Annita Brod, MD      . acetaminophen (TYLENOL) tablet 650 mg  650 mg Oral Q6H PRN Annita Brod, MD       Or  . acetaminophen (TYLENOL) suppository 650 mg  650 mg Rectal Q6H PRN Annita Brod, MD      . ARIPiprazole (ABILIFY) tablet 10 mg  10 mg Oral Daily Annita Brod, MD   10 mg at 01/08/15 0956  . cefTRIAXone (ROCEPHIN) 1 g in dextrose 5 % 50 mL IVPB  1 g Intravenous Q24H Annita Brod, MD   1 g at 01/08/15 0953  . feeding supplement (ENSURE ENLIVE) (ENSURE ENLIVE) liquid 237 mL  237 mL Oral TID BM Annita Brod, MD  237 mL at 01/08/15 0959  . metoprolol tartrate (LOPRESSOR) tablet 12.5 mg  12.5 mg Oral BID Belkys A Regalado, MD   12.5 mg at 01/08/15 0956  . ondansetron (ZOFRAN) tablet 4 mg  4 mg Oral Q6H PRN Annita Brod, MD       Or  . ondansetron [facility]) injection 4 mg  4 mg Intravenous Q6H PRN Annita Brod, MD      . QUEtiapine (SEROQUEL) tablet 100 mg  100 mg Oral Daily Annita Brod, MD   100 mg at 01/08/15 0956  . Rivaroxaban (XARELTO) tablet 15 mg  15 mg Oral Q supper Annita Brod, MD   15 mg at 01/07/15 5284    Musculoskeletal: Strength & Muscle Tone: decreased Gait & Station: unable to stand Patient leans: N/A  Psychiatric Specialty Exam: ROS sick, weak, tired.   Blood pressure 88/44, pulse 80, temperature 97.7 F (36.5 C), temperature source Oral, resp. rate 14, weight 47.3 kg (104 lb 4.4 oz), SpO2 100 %.Body mass index is 21.05 kg/(m^2).  General Appearance: Guarded  Eye Contact::  Good  Speech:  Slow  Volume:  Decreased  Mood:  Anxious and Depressed  Affect:  Constricted and Depressed  Thought Process:  Coherent and Goal Directed  Orientation:  Full (Time, Place, and Person)  Thought Content:  Rumination  Suicidal Thoughts:  Yes.  without intent/plan  Homicidal Thoughts:  No  Memory:  Immediate;   Fair Recent;   Fair  Judgement:  Fair  Insight:  Fair  Psychomotor Activity:  Decreased  Concentration:  Fair  Recall:  [employer] of Knowledge:Fair  Language: Good  Akathisia:  Negative  Handed:  Right  AIMS (if indicated):     Assets:  Communication Skills Desire for Improvement Leisure Time Resilience  ADL's:  Impaired  Cognition: Impaired,  Mild  Sleep:      Treatment Plan Summary: Daily contact with patient to assess and evaluate symptoms and progress in treatment and Medication management  Disposition: Continue current medication treatment Refer to Psych SW regarding geriatric placement Recommend psychiatric Inpatient admission when medically cleared. Supportive therapy provided about ongoing stressors.  Appreciate psychiatric consultation and follow up as clinically required Please contact [phone] or [phone] if needs further assistance  Iza Preston,JANARDHAHA R. 01/08/2015 11:17 AM

## 2015-01-08 NOTE — Progress Notes (Signed)
Echocardiogram 2D Echocardiogram has been performed.  Shannon Hurst 01/08/2015, 2:21 PM 

## 2015-01-08 NOTE — Progress Notes (Signed)
[facility] PROGRESS NOTE  Jetty Berland [place] IRJ:[id number] DOB: [birth year] DOA: 01/07/2015 PCP: Antonietta Jewel, MD  Assessment/Plan:  Shannon Hurst is a 74 y.o. female  With past history of depression and DVT on Xarelto who was hospitalized in [place] for what reportedly was a suicide attempt. Patient was felt to be stable, discharged given a train ticket back to her home in [place] and then told to go to an ER there. (While she was in [place], patient lost her apartment ) Upon arrival in the emergency room on , 10/20, patient was complaining of some overall weakness and she was found to have a moderate UTI with a mild leukocytosis. She reported active suicidal ideation and hospitalists were called for further evaluation.  1-UTI;  Presents with leukocytosis, UA with too numerous to count WBC.  Follow urine culture.  Continue with ceftriaxone.   Hypotension;  Check lactic acid.  IV fluids.  Holder parameter for metoprolol.   Hyperbilirubinemia, elevated Alk phosphatase:  Repeat labs in am.   Chronic Diastolic HF; Compensated. Monitor on fluids.   HTN; hold Cardizem due to hypotension.   [place], depression, suicidal thought. Psych consulted.  Sitter at bedside.   DVT; Continue with xarelto.   A-fib (HCC) paroxysmal: On Xarelto. Hold cardizem due to hypotension. Continue with metoprolol/   Lower extremity weakness:  stroke workup with echocardiogram, A1c pending.  LDL 79.  MRI brain normal.  PT evaluation.  Will request records from [place]. ??   Code Status: Full Code.  Family Communication:  Care discussed with patient.  Disposition Plan: Remain inpatient.    Consultants:  Psych  Procedures:  ECHO: pending.   Antibiotics:  Ceftriaxone 10-20.   HPI/Subjective: Feeling ok, she has chronic back pain. She has not been able to walk for last 2 months. She relates that MRI of Back was done in [place].   Objective: Filed Vitals:    01/08/15 0756  BP: 105/53  Pulse: 101  Temp:   Resp:     Intake/Output Summary (Last 24 hours) at 01/08/15 0826 Last data filed at 01/08/15 0811  Gross per 24 hour  Intake    120 ml  Output      0 ml  Net    120 ml   There were no vitals filed for this visit.  Exam:   General:  Alert in no distress  Cardiovascular: S 1, S 2 RRR  Respiratory: CTA  Abdomen: BS present, soft, nt  Musculoskeletal: no edema  Data Reviewed: Basic Metabolic Panel:  Recent Labs Lab 01/07/15 1018 01/08/15 0526  NA 136 137  K 4.3 3.5  CL 102 105  CO2 23 27  GLUCOSE 139* 94  BUN 26* 16  CREATININE 0.82 0.63  CALCIUM 10.2 9.3   Liver Function Tests:  Recent Labs Lab 01/07/15 1018  AST 41  ALT 34  ALKPHOS 157*  BILITOT 2.1*  PROT 7.9  ALBUMIN 4.1   No results for input(s): LIPASE, AMYLASE in the last 168 hours. No results for input(s): AMMONIA in the last 168 hours. CBC:  Recent Labs Lab 01/07/15 1018 01/08/15 0526  WBC 14.7* 8.6  NEUTROABS 11.8*  --   HGB 12.6 10.2*  HCT 40.9 33.5*  MCV 76.2* 77.5*  PLT 473* 379   Cardiac Enzymes: No results for input(s): CKTOTAL, CKMB, CKMBINDEX, TROPONINI in the last 168 hours. BNP (last 3 results)  Recent Labs  10/01/14 2100 10/02/14 0905 01/07/15 1018  BNP 46.8 52.3 36.4    ProBNP (last  3 results) No results for input(s): PROBNP in the last 8760 hours.  CBG: No results for input(s): GLUCAP in the last 168 hours.  No results found for this or any previous visit (from the past 240 hour(s)).   Studies: Dg Chest 2 View  01/07/2015  CLINICAL DATA:  Shortness of breath.  Evaluate for pneumonia EXAM: CHEST  2 VIEW COMPARISON:  10/01/2014 FINDINGS: Large hiatal hernia with similar appearance to prior. Hyperinflation, chronic. There is no edema, consolidation, effusion, or pneumothorax. Normal heart size and stable aortic tortuosity. IMPRESSION: No acute finding. Large hiatal hernia. Electronically Signed   By: Monte Fantasia M.D.   On: 01/07/2015 11:23   Mr Brain Wo Contrast  01/08/2015  CLINICAL DATA:  CVA questioned due to confusion. Generalized weakness. EXAM: MRI HEAD WITHOUT CONTRAST TECHNIQUE: Multiplanar, multiecho pulse sequences of the brain and surrounding structures were obtained without intravenous contrast. COMPARISON:  Head CT 03/03/2010 FINDINGS: Motion degraded but overall diagnostic. Calvarium and upper cervical spine: No focal marrow signal abnormality. Orbits: No significant findings. Sinuses and Mastoids: Mucosal thickening within right concha bullosa. No acute finding. Brain: No acute infarct, hemorrhage, hydrocephalus, or mass lesion. No evidence of large vessel occlusion. No unusual/focal atrophy or ischemic change for age. No ventriculomegaly or extra-axial fluid collection. Borderline enlargement of the pituitary gland without heterogeneity to suggest mass. IMPRESSION: Normal brain MRI for age. Electronically Signed   By: Monte Fantasia M.D.   On: 01/08/2015 07:15    Scheduled Meds: .  stroke: mapping our early stages of recovery book   Does not apply Once  . ARIPiprazole  10 mg Oral Daily  . cefTRIAXone (ROCEPHIN)  IV  1 g Intravenous Q24H  . feeding supplement (ENSURE ENLIVE)  237 mL Oral TID BM  . QUEtiapine  100 mg Oral Daily  . Rivaroxaban  15 mg Oral Q supper   Continuous Infusions:   Active Problems:   Bipolar disorder ([place])   HTN (hypertension)   DVT (deep venous thrombosis) (HCC)   A-fib (HCC)   UTI (lower urinary tract infection)   Chronic diastolic heart failure (HCC)   Feeling suicidal   Lower extremity weakness   Suicidal ideation    Time spent: 35 minutes.     Niel Hummer A  [facility] Pager [phone]. If 7PM-7AM, please contact night-coverage at [email or web address], password [facility] 01/08/2015, 8:26 AM  LOS: 1 day

## 2015-01-08 NOTE — Evaluation (Signed)
Occupational Therapy Evaluation Patient Details Name: Shannon Hurst MRN: [id number] DOB: 02/29/1941 Today's Date: 01/08/2015    History of Present Illness  This 74 y.o. Female admitted with generalized weakness, and suicidal ideation.  Pt found to have moderate UTI.   Pt recently was hospitalized in [place] for suicide attempt,  and given train ticket back to [place] (per her report).  PMH includes:  Bipolar disorder with SI, DVT,  Chronic diastolic HF   Clinical Impression   Pt admitted with above. She demonstrates the below listed deficits and will benefit from continued OT to maximize safety and independence with BADLs.  Pt presents to OT with generalized weakness.   Currently, she requires mod - max A for ADLs, and min A for functional mobility.  She reports she is currently homeless.  She will require 24 hour assist at discharge based on performance on evaluation.   Optimally recommend SNF, although this may not be a viable option.      Follow Up Recommendations  SNF;Supervision/Assistance - 24 hour    Equipment Recommendations  3 in 1 bedside comode;Wheelchair (measurements OT)    Recommendations for Other Services       Precautions / Restrictions Precautions Precautions: Fall      Mobility Bed Mobility Overal bed mobility: Needs Assistance Bed Mobility: Supine to Sit;Sit to Supine     Supine to sit: Min assist Sit to supine: Min assist   General bed mobility comments: requires assist to move LEs off EOB and to lift trunk form bed.  She frequently states she "can't" move  Transfers Overall transfer level: Needs assistance   Transfers: Sit to/from Stand;Stand Pivot Transfers Sit to Stand: Min assist Stand pivot transfers: Min assist       General transfer comment: min A for balance and to steady    Balance Overall balance assessment: Needs assistance Sitting-balance support: Feet supported Sitting balance-Leahy Scale: Fair     Standing balance support:  Bilateral upper extremity supported Standing balance-Leahy Scale: Poor                              ADL Overall ADL's : Needs assistance/impaired Eating/Feeding: Set up;Sitting   Grooming: Wash/dry hands;Wash/dry face;Oral care;Minimal assistance;Sitting   Upper Body Bathing: Moderate assistance;Sitting   Lower Body Bathing: Maximal assistance;Sit to/from stand   Upper Body Dressing : Maximal assistance;Sitting   Lower Body Dressing: Moderate assistance;Sit to/from stand   Toilet Transfer: Minimal assistance;Stand-pivot;Ambulation;BSC;RW   Toileting- Clothing Manipulation and Hygiene: Minimal assistance;Sit to/from stand       Functional mobility during ADLs: Minimal assistance;Rolling walker General ADL Comments: Pt requires max encouragement for participation, frequently states "I can't" and will attempt only with max cues.   She appears to have generalized weakness that hinders her ability to perform ADLs, but difficult to accurately determine the true extent of her limitations due to behavioral issues      Vision     Perception     Praxis      Pertinent Vitals/Pain Pain Assessment: Faces Faces Pain Scale: Hurts little more Pain Location: did not specify  Pain Descriptors / Indicators: Grimacing Pain Intervention(s): Monitored during session     Hand Dominance Right   Extremity/Trunk Assessment Upper Extremity Assessment Upper Extremity Assessment: RUE deficits/detail;LUE deficits/detail RUE Deficits / Details: Pt initially reports she can't move her arms due to arthritis.  She will reach to ~70* shoulder elevation, but will not attempt further.  AAROM WFL.  Arthritis deformity noted bil. hands.  LUE Deficits / Details: Pt initially reports she can't move her arms due to arthritis.  She will reach to ~80* shoulder elevation, but will not attempt further.   AAROM WFL.  Arthritis deformity noted bil. hands.    Lower Extremity Assessment Lower  Extremity Assessment: Defer to PT evaluation   Cervical / Trunk Assessment Cervical / Trunk Assessment: Kyphotic   Communication     Cognition Arousal/Alertness: Awake/alert Behavior During Therapy: Anxious (irritable ) Overall Cognitive Status: No family/caregiver present to determine baseline cognitive functioning                 General Comments: Pt provides inconsistent responses during eval.   She follows one step commands consistently.   Cognition difficult to accurately assess due to behavioral issues   General Comments       Exercises       Shoulder Instructions      Home Living Family/patient expects to be discharged to:: Shelter/Homeless                                 Additional Comments: Pt reports she recently lost her apartment.  She reports she was in [place] hospitalized x 2 mos, and took train back to [employer]      Prior Functioning/Environment Level of Independence: Needs assistance  Gait / Transfers Assistance Needed: Pt uses a rollator.  She states she can't walk, and has not walked in 2 mos.  She reports they used a w/c to transport her on and off the train, and states she did not use bathroom, or get up for the 4 day trip ADL's / Homemaking Assistance Needed: Pt initially states she has been able to do her ADLs, but when prompted she states she can't.   She initially stated she got herself bathed and dressed on the train, but later stated "some nice woman helped me"        OT Diagnosis: Generalized weakness;Cognitive deficits   OT Problem List: Decreased strength;Decreased range of motion;Decreased activity tolerance;Impaired balance (sitting and/or standing);Decreased coordination;Decreased cognition;Decreased safety awareness;Decreased knowledge of use of DME or AE;Impaired UE functional use   OT Treatment/Interventions: Self-care/ADL training;Therapeutic exercise;DME and/or AE instruction;Therapeutic activities;Cognitive  remediation/compensation;Visual/perceptual remediation/compensation;Patient/family education;Balance training    OT Goals(Current goals can be found in the care plan section) Acute Rehab OT Goals Patient Stated Goal: did not state OT Goal Formulation: With patient Time For Goal Achievement: 01/22/15 Potential to Achieve Goals: Fair ADL Goals Pt Will Perform Grooming: with min assist;standing Pt Will Perform Upper Body Bathing: with supervision;sitting Pt Will Perform Lower Body Bathing: with min assist;sit to/from stand Pt Will Perform Lower Body Dressing: with min assist;sit to/from stand Pt Will Transfer to Toilet: with min guard assist;ambulating;regular height toilet;bedside commode;grab bars Pt Will Perform Toileting - Clothing Manipulation and hygiene: with min guard assist;sit to/from stand  OT Frequency: Min 2X/week   Barriers to D/C: Decreased caregiver support          Co-evaluation              End of Session Equipment Utilized During Treatment: Engineer, water Communication: Mobility status  Activity Tolerance: Patient tolerated treatment well Patient left: in bed;with call bell/phone within reach;with nursing/sitter in room   Time: 1004-1029 OT Time Calculation (min): 25 min Charges:  OT General Charges $OT Visit: 1 Procedure OT Evaluation $Initial OT Evaluation Tier I:  1 Procedure OT Treatments $Self Care/Home Management : 8-22 mins G-Codes: OT G-codes **NOT FOR INPATIENT CLASS** Functional Limitation: Self care Self Care Current Status (Z6109(G8987): At least 60 percent but less than 80 percent impaired, limited or restricted Self Care Goal Status (U0454(G8988): At least 20 percent but less than 40 percent impaired, limited or restricted  Haidynn Almendarez M 01/08/2015, 11:03 AM

## 2015-01-08 NOTE — Evaluation (Addendum)
Physical Therapy Evaluation Patient Details Name: Shannon Hurst MRN: [id number] DOB: [birth year] Today's Date: 01/08/2015   History of Present Illness  74 y.o. Female admitted with generalized weakness, and suicidal ideation. Pt found to have moderate UTI. Pt recently was hospitalized in [place] for suicide attempt, and given train ticket back to [place] (per her report). PMH includes: Bipolar disorder with SI, DVT, Chronic diastolic HF  Clinical Impression  Pt admitted with above diagnosis. Pt currently with functional limitations due to the deficits listed below (see PT Problem List). Pt repeatedly stated she could not walk and that she needed help to get out of bed. She walked 54' with her rollator without loss of balance and didn't require physical assist for getting in/out of bed. She was incontinent of urine while walking. She would benefit from 24* supervision  to manage ADLs and for safety 2* psych issues. Pt will benefit from skilled PT to increase their independence and safety with mobility to allow discharge to the venue listed below.       Follow Up Recommendations SNF;Supervision/Assistance - 24 hour    Equipment Recommendations  None recommended by PT    Recommendations for Other Services       Precautions / Restrictions Precautions Precautions: Fall Restrictions Weight Bearing Restrictions: No      Mobility  Bed Mobility Overal bed mobility: Needs Assistance Bed Mobility: Supine to Sit;Sit to Supine     Supine to sit: Modified independent (Device/Increase time) Sit to supine: Modified independent (Device/Increase time)   General bed mobility comments: Pt stated she needed help to get in and out of bed, however was able to perform these movements without physical assist.   Transfers Overall transfer level: Needs assistance Equipment used: 4-wheeled walker Transfers: Sit to/from Stand Sit to Stand: Min guard Stand pivot transfers: Min assist        General transfer comment: min/guard for safety  Ambulation/Gait Ambulation/Gait assistance: Supervision Ambulation Distance (Feet): 130 Feet Assistive device: 4-wheeled walker Gait Pattern/deviations: Step-through pattern;Decreased stride length   Gait velocity interpretation: at or above normal speed for age/gender General Gait Details: steady with rollator, no LOB, pt was incontinent of urine while walking, stated she usually uses depends  Stairs            Wheelchair Mobility    Modified Rankin (Stroke Patients Only)       Balance Overall balance assessment: Needs assistance Sitting-balance support: Feet supported Sitting balance-Leahy Scale: Fair     Standing balance support: Bilateral upper extremity supported Standing balance-Leahy Scale: Poor                               Pertinent Vitals/Pain Pain Assessment: No/denies pain Faces Pain Scale: Hurts little more Pain Location: did not specify  Pain Descriptors / Indicators: Grimacing Pain Intervention(s): Monitored during session    Home Living Family/patient expects to be discharged to:: Shelter/Homeless                 Additional Comments: Pt reports she recently lost her apartment.  She reports she was in [place] hospitalized x 2 mos, and took train back to [place]    Prior Function Level of Independence: Needs assistance   Gait / Transfers Assistance Needed: Pt uses a rollator.  She states she can't walk, and has not walked in 2 mos.  She reports they used a w/c to transport her on and off the train, and states  she did not use bathroom, or get up for the 4 day trip  ADL's / Homemaking Assistance Needed: Pt initially states she has been able to do her ADLs, but when prompted she states she can't.   She initially stated she got herself bathed and dressed on the train, but later stated "some nice woman helped me"        Hand Dominance   Dominant Hand: Right    Extremity/Trunk Assessment    Upper Extremity Assessment: RUE deficits/detail;LUE deficits/detail RUE Deficits / Details: Pt initially reports she can't move her arms due to arthritis.  She will reach to ~70* shoulder elevation, but will not attempt further.   AAROM WFL.  Arthritis deformity noted bil. hands.      LUE Deficits / Details: Pt initially reports she can't move her arms due to arthritis.  She will reach to ~80* shoulder elevation, but will not attempt further.   AAROM WFL.  Arthritis deformity noted bil. hands.    Lower Extremity Assessment: Overall WFL for tasks assessed      Cervical / Trunk Assessment: Kyphotic  Communication   Communication: No difficulties  Cognition Arousal/Alertness: Awake/alert Behavior During Therapy: Anxious;Agitated (irritable ) Overall Cognitive Status: No family/caregiver present to determine baseline cognitive functioning                 General Comments: Pt provides inconsistent responses during eval.   She follows one step commands consistently.   Cognition difficult to accurately assess due to behavioral issues    General Comments      Exercises        Assessment/Plan    PT Assessment Patient needs continued PT services  PT Diagnosis     PT Problem List Decreased activity tolerance  PT Treatment Interventions Gait training;Functional mobility training;Therapeutic activities   PT Goals (Current goals can be found in the Care Plan section) Acute Rehab PT Goals Patient Stated Goal: did not state PT Goal Formulation: Patient unable to participate in goal setting Time For Goal Achievement: 01/22/15 Potential to Achieve Goals: Fair    Frequency Min 3X/week   Barriers to discharge Decreased caregiver support pt stated, "I don't want to discuss that" when asked about DC plan and available support, per chart pt is homeless    Co-evaluation               End of Session Equipment Utilized During Treatment: Gait belt Activity Tolerance: Patient  tolerated treatment well;No increased pain Patient left: in bed;with call bell/phone within reach;with nursing/sitter in room (sitter in room) Nurse Communication: Mobility status    Functional Assessment Tool Used: clinical judgement Functional Limitation: Mobility: Walking and moving around Mobility: Walking and Moving Around Current Status (W0981(G8978): At least 1 percent but less than 20 percent impaired, limited or restricted Mobility: Walking and Moving Around Goal Status [phone](G8979): 0 percent impaired, limited or restricted    Time: 1323-1340 PT Time Calculation (min) (ACUTE ONLY): 17 min   Charges:   PT Evaluation $Initial PT Evaluation Tier I: 1 Procedure     PT G Codes:   PT G-Codes **NOT FOR INPATIENT CLASS** Functional Assessment Tool Used: clinical judgement Functional Limitation: Mobility: Walking and moving around Mobility: Walking and Moving Around Current Status (W2956(G8978): At least 1 percent but less than 20 percent impaired, limited or restricted Mobility: Walking and Moving Around Goal Status [phone](G8979): 0 percent impaired, limited or restricted    Tamala SerUhlenberg, Tarl Cephas Kistler 01/08/2015, 1:51 PM [phone]

## 2015-01-08 NOTE — Clinical Documentation Improvement (Signed)
Hospitalist  Can the diagnosis of systemic infection be further specified?   Sepsis - specify causative organism if known  Determine if there is Severe Sepsis (Sepsis with organ dysfunction - specify), Septic Shock if present  Identify etiology of Sepsis - Device, Implant, Graft, Infusion, Abortion  Specify organ dysfunction - Respiratory Failure, Encephalopathy, Acute Kidney Failure, Pneumonia, UTI, Other (specify), Unable to Clinically Determine}  Other  Clinically Undetermined  Document any associated diagnoses/conditions.   Supporting Information: 10/21 ED note lists Sepsis in the final diagnosis list. Patient is Septic from UTI, rocephin given per 10/21 ED note.  Lactic acid: 10/21:  1.4. 10/21:  1.0.   Please exercise your independent, professional judgment when responding. A specific answer is not anticipated or expected.   Thank Sabino DonovanYou,  Wylee Dorantes [facility] [employer] [facility] [place] [phone]

## 2015-01-09 DIAGNOSIS — I5032 Chronic diastolic (congestive) heart failure: Secondary | ICD-10-CM | POA: Diagnosis not present

## 2015-01-09 DIAGNOSIS — I480 Paroxysmal atrial fibrillation: Secondary | ICD-10-CM | POA: Diagnosis not present

## 2015-01-09 DIAGNOSIS — N390 Urinary tract infection, site not specified: Secondary | ICD-10-CM | POA: Diagnosis not present

## 2015-01-09 DIAGNOSIS — R531 Weakness: Secondary | ICD-10-CM | POA: Diagnosis not present

## 2015-01-09 DIAGNOSIS — R45851 Suicidal ideations: Secondary | ICD-10-CM | POA: Diagnosis not present

## 2015-01-09 DIAGNOSIS — L8990 Pressure ulcer of unspecified site, unspecified stage: Secondary | ICD-10-CM | POA: Diagnosis present

## 2015-01-09 LAB — BASIC METABOLIC PANEL
ANION GAP: 8 (ref 5–15)
BUN: 10 mg/dL (ref 6–20)
CALCIUM: 9.2 mg/dL (ref 8.9–10.3)
CO2: 25 mmol/L (ref 22–32)
Chloride: 106 mmol/L (ref 101–111)
Creatinine, Ser: 0.42 mg/dL — ABNORMAL LOW (ref 0.44–1.00)
GFR calc Af Amer: >60 mL/min (ref >60)
GFR calc non Af Amer: >60 mL/min (ref >60)
GLUCOSE: 83 mg/dL (ref 65–99)
POTASSIUM: 3.6 mmol/L (ref 3.5–5.1)
Sodium: 139 mmol/L (ref 135–145)

## 2015-01-09 LAB — HEPATIC FUNCTION PANEL
ALT: 20 U/L (ref 14–54)
AST: 32 U/L (ref 15–41)
Albumin: 3 g/dL — ABNORMAL LOW (ref 3.5–5.0)
Alkaline Phosphatase: 106 U/L (ref 38–126)
BILIRUBIN TOTAL: 0.3 mg/dL (ref 0.3–1.2)
Total Protein: 6 g/dL — ABNORMAL LOW (ref 6.5–8.1)

## 2015-01-09 LAB — URINE CULTURE

## 2015-01-09 LAB — CBC
HEMATOCRIT: 33.3 % — AB (ref 36.0–46.0)
Hemoglobin: 10.1 g/dL — ABNORMAL LOW (ref 12.0–15.0)
MCH: 23.8 pg — ABNORMAL LOW (ref 26.0–34.0)
MCHC: 30.3 g/dL (ref 30.0–36.0)
MCV: 78.4 fL (ref 78.0–100.0)
Platelets: 364 10*3/uL (ref 150–400)
RBC: 4.25 MIL/uL (ref 3.87–5.11)
RDW: 17.4 % — AB (ref 11.5–15.5)
WBC: 7.8 10*3/uL (ref 4.0–10.5)

## 2015-01-09 LAB — HEMOGLOBIN A1C
HEMOGLOBIN A1C: 5.6 % (ref 4.8–5.6)
MEAN PLASMA GLUCOSE: 114 mg/dL

## 2015-01-09 MED ORDER — POLYETHYLENE GLYCOL 3350 17 G PO PACK
17.0000 g | PACK | Freq: Every day | ORAL | Status: DC
  Administered 2015-01-09 – 2015-01-10 (×2): 17 g via ORAL
  Filled 2015-01-09 (×9): qty 1

## 2015-01-09 MED ORDER — CIPROFLOXACIN HCL 500 MG PO TABS
500.0000 mg | ORAL_TABLET | Freq: Two times a day (BID) | ORAL | Status: AC
  Administered 2015-01-09 – 2015-01-13 (×10): 500 mg via ORAL
  Filled 2015-01-09 (×11): qty 1

## 2015-01-09 NOTE — Plan of Care (Signed)
Problem: Discharge Progression Outcomes Goal: Other Discharge Outcomes/Goals Stroke education provided.  Stroke printed material given to patient.

## 2015-01-09 NOTE — Progress Notes (Addendum)
Clinical Social Work  Per MD, patient is medically stable to DC and psych MD recommends inpatient placement. CSW contacted the following facilities re: placement:  [facility]) reports that pt would be more appropriate for geri-psych  [facility]- [facility]  [facility]- no [facility] beds  [facility]- unsure of bed status but referral faxed  [facility]- unsure of bed status but referral faxed  [facility]- no available beds  [facility]- [facility]  [facility]- unsure of [facility]. Referral faxed.  [facility]- no [facility]  [facility]- available beds. Referral faxed.  CSW will continue to follow.  Unk LightningHolly Marcheta Horsey, [place] Weekend Coverage [phone]

## 2015-01-09 NOTE — Clinical Social Work Psych Assess (Signed)
Clinical Social Work Nature conservation officer  Clinical Social Worker:  Boone Master, [place] Date/Time:  01/09/2015, 12:30 PM Referred By:  Physician Date Referred:  01/09/15 Reason for Referral:  Behavioral Health Issues   Presenting Symptoms/Problems  Presenting Symptoms/Problems(in person's/family's own words): Patient reports SI.   Abuse/Neglect/Trauma History  Abuse/Neglect/Trauma History:  Denies History Abuse/Neglect/Trauma History Comments (indicate dates):  N/A   Psychiatric History  Psychiatric History:  Inpatient/Hospitalization, Outpatient Treatment Psychiatric Medication:  None currently   Current Mental Health Hospitalizations/Previous Mental Health History:  Patient reports chronic depression and bipolar. Patient was not receiving any treatment prior to hospitalization.   Current Provider:  None currently Place and Date:  N/A  Current Medications:   Scheduled Meds: .  stroke: mapping our early stages of recovery book   Does not apply Once  . ARIPiprazole  10 mg Oral Daily  . ciprofloxacin  500 mg Oral BID  . feeding supplement (ENSURE ENLIVE)  237 mL Oral TID BM  . metoprolol tartrate  12.5 mg Oral BID  . polyethylene glycol  17 g Oral Daily  . QUEtiapine  100 mg Oral Daily  . Rivaroxaban  15 mg Oral Q supper   Continuous Infusions:  PRN Meds:.acetaminophen **OR** acetaminophen, ondansetron **OR** ondansetron (ZOFRAN) IV    Previous Inpatient Admission/Date/Reason:  Pt reports she was just released from a hospital in [place] that she was at for 2 months.   Emotional Health/Current Symptoms  Suicide/Self Harm: Suicidal Ideation (ex. "I can't take anymore, I wish I could disappear") Suicide Attempt in Past (date/description):  Patient reports continued depression and SI. Patient cannot contract for safety.  Other Harmful Behavior (ex. homicidal ideation) (describe):  N/A   Psychotic/Dissociative Symptoms  Psychotic/Dissociative Symptoms:  None Reported Other Psychotic/Dissociative Symptoms:  N/A   Attention/Behavioral Symptoms  Attention/Behavioral Symptoms: Within Normal Limits Other Attention/Behavioral Symptoms:  Patient engaged during assessment.   Cognitive Impairment  Cognitive Impairment:  Within Normal Limits Other Cognitive Impairment:  Patient alert and oriented.   Mood and Adjustment  Mood and Adjustment:  Depression   Stress, Anxiety, Trauma, Any Recent Loss/Stressor  Stress, Anxiety, Trauma, Any Recent Loss/Stressor: Relationship Anxiety (frequency):  N/A  Phobia (specify):  N/A  Compulsive Behavior (specify):  N/A  Obsessive Behavior (specify):  N/A  Other Stress, Anxiety, Trauma, Any Recent Loss/Stressor:  Patient reports limited support and strained relationships.   Substance Abuse/Use  Substance Abuse/Use: None SBIRT Completed (please refer for detailed history): No Self-reported Substance Use (last use and frequency):  Patient denies any substance use.  Urinary Drug Screen Completed: Yes Alcohol Level:  N/A   Environment/Housing/Living Arrangement  Environmental/Housing/Living Arrangement: Stable Housing Who is in the Home:  Alone  Emergency Contact:  None at this time   Financial  Financial: Medicare, Medicaid   Patient's Strengths and Goals  Patient's Strengths and Goals (patient's own words):  Patient agreeable to treatment.   Clinical Social Worker's Interpretive Summary  Clinical Social Workers Interpretive Summary:  CSW met with patient at bedside. Patient reports she continues to feel depressed and feels that inpatient treatment is needed. Patient was recently DC from a hospital in [place] where she was visiting. Patient reports that she feels depressed and has limited support. Patient has been to [place] about 5 years ago and prefers to return there. CSW explained that geri-psych is recommended and [facility] cannot manage her needs at this time. CSW faxed out referrals for  placement and will continue to follow patient.    Disposition  Disposition: Inpatient Referral Made ([facility] [place])     Brook Highland, [place] Weekend Coverage [phone]

## 2015-01-09 NOTE — Progress Notes (Addendum)
[facility] PROGRESS NOTE  Shannon Hurst KDX:[id number] DOB: 04/23/1940 DOA: 01/07/2015 PCP: Antonietta Jewel, MD  Assessment/Plan:  Shannon Hurst is a 74 y.o. female  With past history of depression and DVT on Xarelto who was hospitalized in [place] for what reportedly was a suicide attempt. Patient was felt to be stable, discharged given a train ticket back to her home in [place] and then told to go to an ER there. (While she was in [place], patient lost her apartment ) Upon arrival in the emergency room on , 10/20, patient was complaining of some overall weakness and she was found to have a moderate UTI with a mild leukocytosis. She reported active suicidal ideation and hospitalists were called for further evaluation.  MRI done noted no evidence at all of CVA.  Patient's lower extremity weakness looks to be more of deconditioning & patient subjection.  Seen by psychiatry who feel that patient needs active inpatient psychiatric management.  Patient since responded to IV rocephin & with normal WBC now, medically stable.  1-UTI;  Presents with leukocytosis, UA with too numerous to count WBC.  Pan sensitive Ecoli Change to po Cipro  Hypotension;  May be more from volume depletion.  Patient not septic. Lactic acid normal. IV fluids.   Hyperbilirubinemia, elevated Alk phosphatase:  Likely in the setting of acute illness from infection.  Chronic Diastolic HF; Compensated. Monitor on fluids.   HTN; hold Cardizem due to hypotension.   Bioplar, depression, suicidal thought. Psych consulted.  Sitter at bedside.   DVT; Continue with xarelto.   A-fib (HCC) paroxysmal: On Xarelto. Hold cardizem due to hypotension. Continue with metoprolol/   Lower extremity weakness:  As above.  Will eventually need a SNF.  Stage 1 sacral decubitus ulcer-Present on admission.  Encouraging out of bed with PT    Code Status: Full Code.  Family Communication:  No  family Disposition Plan: Tx to inpatient psychiatry facility when bed available.   Consultants:  Psych  Procedures: ECHO: Normal Antibiotics:  Ceftriaxone 10/20-10/22.  Cipro 10/22-present   HPI/Subjective: Feeling better.  No complaints. Still wants to hurt herself.    Objective: Filed Vitals:   01/09/15 0532  BP: 118/55  Pulse: 76  Temp: 98.1 F (36.7 C)  Resp: 14    Intake/Output Summary (Last 24 hours) at 01/09/15 1145 Last data filed at 01/08/15 2152  Gross per 24 hour  Intake     60 ml  Output      0 ml  Net     60 ml   Filed Weights   01/08/15 0930  Weight: 47.3 kg (104 lb 4.4 oz)    Exam:   General:  Alert & oriented, NAD  Cardiovascular: RRR S1S2  Respiratory: CTA  Abdomen: Soft, NT, ND +BS  Musculoskeletal: no edema  Data Reviewed: Basic Metabolic Panel:  Recent Labs Lab 01/07/15 1018 01/08/15 0526 01/09/15 0600  NA 136 137 139  K 4.3 3.5 3.6  CL 102 105 106  CO2 23 27 25   GLUCOSE 139* 94 83  BUN 26* 16 10  CREATININE 0.82 0.63 0.42*  CALCIUM 10.2 9.3 9.2   Liver Function Tests:  Recent Labs Lab 01/07/15 1018  AST 41  ALT 34  ALKPHOS 157*  BILITOT 2.1*  PROT 7.9  ALBUMIN 4.1   No results for input(s): LIPASE, AMYLASE in the last 168 hours. No results for input(s): AMMONIA in the last 168 hours. CBC:  Recent Labs Lab 01/07/15 1018 01/08/15 0526 01/09/15 0600  WBC 14.7* 8.6 7.8  NEUTROABS 11.8*  --   --   HGB 12.6 10.2* 10.1*  HCT 40.9 33.5* 33.3*  MCV 76.2* 77.5* 78.4  PLT 473* 379 364   Cardiac Enzymes: No results for input(s): CKTOTAL, CKMB, CKMBINDEX, TROPONINI in the last 168 hours. BNP (last 3 results)  Recent Labs  10/01/14 2100 10/02/14 0905 01/07/15 1018  BNP 46.8 52.3 36.4    ProBNP (last 3 results) No results for input(s): PROBNP in the last 8760 hours.  CBG: No results for input(s): GLUCAP in the last 168 hours.  Recent Results (from the past 240 hour(s))  Urine culture      Status: None   Collection Time: 01/07/15 10:32 AM  Result Value Ref Range Status   Specimen Description URINE, CATHETERIZED  Final   Special Requests NONE  Final   Culture   Final    >=100,000 COLONIES/mL PROTEUS MIRABILIS Performed at [facility]    Report Status 01/09/2015 FINAL  Final   Organism ID, Bacteria PROTEUS MIRABILIS  Final      Susceptibility   Proteus mirabilis - MIC*    AMPICILLIN <=2 SENSITIVE Sensitive     CEFAZOLIN <=4 SENSITIVE Sensitive     CEFTRIAXONE <=1 SENSITIVE Sensitive     CIPROFLOXACIN <=0.25 SENSITIVE Sensitive     GENTAMICIN <=1 SENSITIVE Sensitive     IMIPENEM 4 SENSITIVE Sensitive     NITROFURANTOIN 128 RESISTANT Resistant     TRIMETH/SULFA <=20 SENSITIVE Sensitive     AMPICILLIN/SULBACTAM <=2 SENSITIVE Sensitive     PIP/TAZO <=4 SENSITIVE Sensitive     * >=100,000 COLONIES/mL PROTEUS MIRABILIS     Studies: Mr Brain Wo Contrast  01/08/2015  CLINICAL DATA:  CVA questioned due to confusion. Generalized weakness. EXAM: MRI HEAD WITHOUT CONTRAST TECHNIQUE: Multiplanar, multiecho pulse sequences of the brain and surrounding structures were obtained without intravenous contrast. COMPARISON:  Head CT 03/03/2010 FINDINGS: Motion degraded but overall diagnostic. Calvarium and upper cervical spine: No focal marrow signal abnormality. Orbits: No significant findings. Sinuses and Mastoids: Mucosal thickening within right concha bullosa. No acute finding. Brain: No acute infarct, hemorrhage, hydrocephalus, or mass lesion. No evidence of large vessel occlusion. No unusual/focal atrophy or ischemic change for age. No ventriculomegaly or extra-axial fluid collection. Borderline enlargement of the pituitary gland without heterogeneity to suggest mass. IMPRESSION: Normal brain MRI for age. Electronically Signed   By: Monte Fantasia M.D.   On: 01/08/2015 07:15    Scheduled Meds: .  stroke: mapping our early stages of recovery book   Does not apply Once  .  ARIPiprazole  10 mg Oral Daily  . cefTRIAXone (ROCEPHIN)  IV  1 g Intravenous Q24H  . feeding supplement (ENSURE ENLIVE)  237 mL Oral TID BM  . metoprolol tartrate  12.5 mg Oral BID  . polyethylene glycol  17 g Oral Daily  . QUEtiapine  100 mg Oral Daily  . Rivaroxaban  15 mg Oral Q supper   Continuous Infusions:   Principal Problem:   Suicidal ideation Active Problems:   Bipolar disorder (HCC)   HTN (hypertension)   DVT (deep venous thrombosis) (HCC)   A-fib (HCC)   UTI (lower urinary tract infection)   Chronic diastolic heart failure (HCC)   Feeling suicidal   Lower extremity weakness   UTI (urinary tract infection)   Pressure ulcer    Time spent: 15 minutes.     [facility] Pager [phone]. If 7PM-7AM, please contact night-coverage at [email or web address],  password [email or web address] 01/09/2015, 11:45 AM  LOS: 2 days

## 2015-01-09 NOTE — Progress Notes (Signed)
SLP Cancellation Note  Patient Details Name: Rosebud PolesGoldie Adams Ludvigsen MRN: [id number] DOB: 22-Jul-1940   Cancelled treatment:       Reason Eval/Treat Not Completed: SLP screened, no needs identified, will sign off. Per chart MRI is negative for CVA, will defer SLP work up.    Linda Biehn, Riley NearingBonnie Caroline 01/09/2015, 8:13 AM

## 2015-01-10 DIAGNOSIS — I10 Essential (primary) hypertension: Secondary | ICD-10-CM | POA: Diagnosis not present

## 2015-01-10 DIAGNOSIS — I480 Paroxysmal atrial fibrillation: Secondary | ICD-10-CM | POA: Diagnosis not present

## 2015-01-10 DIAGNOSIS — R531 Weakness: Secondary | ICD-10-CM | POA: Diagnosis not present

## 2015-01-10 DIAGNOSIS — R45851 Suicidal ideations: Secondary | ICD-10-CM | POA: Diagnosis not present

## 2015-01-10 DIAGNOSIS — N390 Urinary tract infection, site not specified: Secondary | ICD-10-CM | POA: Diagnosis not present

## 2015-01-10 MED ORDER — CARVEDILOL 3.125 MG PO TABS
3.1250 mg | ORAL_TABLET | Freq: Two times a day (BID) | ORAL | Status: DC
  Administered 2015-01-10 – 2015-01-17 (×12): 3.125 mg via ORAL
  Filled 2015-01-10 (×16): qty 1

## 2015-01-10 NOTE — Progress Notes (Addendum)
[facility]- AC Minerva Areola(Eric) reports that pt would be more appropriate for geri-psych  [facility]  [facility]  [facility] CSW left message regarding referral.   [facility]- declined- 10/23 due to medical   [facility]- no [facility] beds  [facility]  [facility]- unsure of [facility]. Referral faxed.  [facility]- [facility]  [facility]- available beds. Referral faxed. Referral re faxed 01/10/2015   Olga CoasterKristen Tamma Brigandi, LCSW  Clinical Social Work  Weekend coverage  [phone]

## 2015-01-10 NOTE — Progress Notes (Signed)
[facility] PROGRESS NOTE  Cynde Menard Tarango EQA:[id number] DOB: October 10, 1940 DOA: 01/07/2015 PCP: Antonietta Jewel, MD  Assessment/Plan:  Shannon Hurst is a 74 y.o. female  With past history of depression and DVT on Xarelto who was hospitalized in [place] for what reportedly was a suicide attempt. Patient was felt to be stable, discharged given a train ticket back to her home in [place] and then told to go to an ER there. (While she was in [place], patient lost her apartment ) Upon arrival in the emergency room on , 10/20, patient was complaining of some overall weakness and she was found to have a moderate UTI with a mild leukocytosis. She reported active suicidal ideation and hospitalists were called for further evaluation.  MRI done noted no evidence at all of CVA.  Patient's lower extremity weakness looks to be more of deconditioning & patient subjection.  Seen by psychiatry who started her on Abilify who feel that patient needs active inpatient psychiatric management.  Patient since responded to IV rocephin & with normal WBC now, medically stable.  She's been changed over to by mouth antibiotics. Patient continues to report suicidal ideations.  No complaints today other than not much appetite  1-UTI;  Presents with leukocytosis, UA with too numerous to count WBC.  Pan sensitive Proteus Change to po Cipro  Hypotension;  May be more from volume depletion.  Patient not septic. Lactic acid normal. IV fluids.   Hyperbilirubinemia, elevated Alk phosphatase:  Likely in the setting of acute illness from infection.   HTN; change metoprolol to low-dose Coreg, likely will wean off before discharge  Bioplar, depression, suicidal thought. Psych consulted.  Sitter at bedside.   DVT; Continue with xarelto.   A-fib (HCC) paroxysmal: On Xarelto. Hold cardizem due to hypotension. On Coreg  Lower extremity weakness:  As above.  Will eventually need a SNF.  Stage 1 sacral  decubitus ulcer-Present on admission.  Encouraging out of bed with PT    Code Status: Full Code.  Family Communication:  No family Disposition Plan: Tx to inpatient psychiatry facility when bed available.   Consultants:  Psych  Procedures: ECHO: Normal Antibiotics:  Ceftriaxone 10/20-10/22.  Cipro 10/22-10/26    Objective: Filed Vitals:   01/10/15 0609  BP: 102/55  Pulse: 66  Temp: 98.7 F (37.1 C)  Resp: 16    Intake/Output Summary (Last 24 hours) at 01/10/15 1419 Last data filed at 01/09/15 1720  Gross per 24 hour  Intake    120 ml  Output      0 ml  Net    120 ml   Filed Weights   01/08/15 0930  Weight: 47.3 kg (104 lb 4.4 oz)    Exam:   General:  Alert & oriented, NAD  Cardiovascular: RRR S1S2  Respiratory: CTA  Abdomen: Soft, NT, ND hypoactive bowel sounds  Musculoskeletal: no edema  Data Reviewed: Basic Metabolic Panel:  Recent Labs Lab 01/07/15 1018 01/08/15 0526 01/09/15 0600  NA 136 137 139  K 4.3 3.5 3.6  CL 102 105 106  CO2 23 27 25   GLUCOSE 139* 94 83  BUN 26* 16 10  CREATININE 0.82 0.63 0.42*  CALCIUM 10.2 9.3 9.2   Liver Function Tests:  Recent Labs Lab 01/07/15 1018 01/09/15 0600  AST 41 32  ALT 34 20  ALKPHOS 157* 106  BILITOT 2.1* 0.3  PROT 7.9 6.0*  ALBUMIN 4.1 3.0*   No results for input(s): LIPASE, AMYLASE in the last 168 hours. No results for  input(s): AMMONIA in the last 168 hours. CBC:  Recent Labs Lab 01/07/15 1018 01/08/15 0526 01/09/15 0600  WBC 14.7* 8.6 7.8  NEUTROABS 11.8*  --   --   HGB 12.6 10.2* 10.1*  HCT 40.9 33.5* 33.3*  MCV 76.2* 77.5* 78.4  PLT 473* 379 364   Cardiac Enzymes: No results for input(s): CKTOTAL, CKMB, CKMBINDEX, TROPONINI in the last 168 hours. BNP (last 3 results)  Recent Labs  10/01/14 2100 10/02/14 0905 01/07/15 1018  BNP 46.8 52.3 36.4    ProBNP (last 3 results) No results for input(s): PROBNP in the last 8760 hours.  CBG: No results for  input(s): GLUCAP in the last 168 hours.  Recent Results (from the past 240 hour(s))  Urine culture     Status: None   Collection Time: 01/07/15 10:32 AM  Result Value Ref Range Status   Specimen Description URINE, CATHETERIZED  Final   Special Requests NONE  Final   Culture   Final    >=100,000 COLONIES/mL PROTEUS MIRABILIS Performed at [facility]    Report Status 01/09/2015 FINAL  Final   Organism ID, Bacteria PROTEUS MIRABILIS  Final      Susceptibility   Proteus mirabilis - MIC*    AMPICILLIN <=2 SENSITIVE Sensitive     CEFAZOLIN <=4 SENSITIVE Sensitive     CEFTRIAXONE <=1 SENSITIVE Sensitive     CIPROFLOXACIN <=0.25 SENSITIVE Sensitive     GENTAMICIN <=1 SENSITIVE Sensitive     IMIPENEM 4 SENSITIVE Sensitive     NITROFURANTOIN 128 RESISTANT Resistant     TRIMETH/SULFA <=20 SENSITIVE Sensitive     AMPICILLIN/SULBACTAM <=2 SENSITIVE Sensitive     PIP/TAZO <=4 SENSITIVE Sensitive     * >=100,000 COLONIES/mL PROTEUS MIRABILIS     Studies: No results found.  Scheduled Meds: .  stroke: mapping our early stages of recovery book   Does not apply Once  . ARIPiprazole  10 mg Oral Daily  . carvedilol  3.125 mg Oral BID WC  . ciprofloxacin  500 mg Oral BID  . feeding supplement (ENSURE ENLIVE)  237 mL Oral TID BM  . polyethylene glycol  17 g Oral Daily  . QUEtiapine  100 mg Oral Daily  . Rivaroxaban  15 mg Oral Q supper   Continuous Infusions:   Principal Problem:   Suicidal ideation Active Problems:   Bipolar disorder (HCC)   HTN (hypertension)   DVT (deep venous thrombosis) (HCC)   A-fib (HCC)   UTI (lower urinary tract infection)   Chronic diastolic heart failure (HCC)   Feeling suicidal   Lower extremity weakness   UTI (urinary tract infection)   Pressure ulcer    Time spent: 15 minutes.     [facility] Pager [phone]. If 7PM-7AM, please contact night-coverage at [email or web address], password [email or web address] 01/10/2015, 2:19 PM  LOS: 3  days

## 2015-01-11 DIAGNOSIS — N390 Urinary tract infection, site not specified: Secondary | ICD-10-CM | POA: Diagnosis not present

## 2015-01-11 DIAGNOSIS — R531 Weakness: Secondary | ICD-10-CM | POA: Diagnosis not present

## 2015-01-11 NOTE — Progress Notes (Signed)
OT Cancellation Note  Patient Details Name: Shannon Hurst MRN: [id number] DOB: 02-29-1941   Cancelled Treatment:    Reason Eval/Treat Not Completed: Other (comment) Pt adamantly refuses OOB right now. She states she didn't sleep much last night. She was agreeable to letting therapy check back later. Will reattempt as schedule allows.  Lennox LaityStone, Milam Allbaugh Stafford  [phone] 01/11/2015, 9:16 AM

## 2015-01-11 NOTE — Progress Notes (Signed)
[facility] PROGRESS NOTE  Shannon Hurst ZPH:[id number] DOB: 1941/03/03 DOA: 01/07/2015 PCP: Antonietta Jewel, MD  Assessment/Plan:  Shannon Hurst is a 74 y.o. female  With past history of depression and DVT on Xarelto who was hospitalized in [place] for what reportedly was a suicide attempt. Patient was felt to be stable, discharged given a train ticket back to her home in [place] and then told to go to an ER there. (While she was in [place], patient lost her apartment ) Upon arrival in the emergency room on , 10/20, patient was complaining of some overall weakness and she was found to have a moderate UTI with a mild leukocytosis. She reported active suicidal ideation and hospitalists were called for further evaluation.  MRI done noted no evidence at all of CVA.  Patient's lower extremity weakness looks to be more of deconditioning & patient subjection.  Seen by psychiatry who started her on Abilify who feel that patient needs active inpatient psychiatric management.  Patient since responded to IV rocephin & with normal WBC now, medically stable.  She's been changed over to by mouth antibiotics. Patient continues to report suicidal ideations.  No complaints today.  1-UTI;  Presents with leukocytosis, UA with too numerous to count WBC.  Pan sensitive Proteus Change to po Cipro-course will be completed on 10/26  Hypotension;  May be more from volume depletion.  Patient not septic. Lactic acid normal. IV fluids.   Hyperbilirubinemia, elevated Alk phosphatase:  Likely in the setting of acute illness from infection.   HTN; change metoprolol to low-dose Coreg, likely will wean off before discharge  Bioplar, depression, suicidal thought. Psych consulted.  Sitter at bedside.   DVT; Continue with xarelto.   A-fib (HCC) paroxysmal: On Xarelto. Hold cardizem due to hypotension. On Coreg  Lower extremity weakness:  As above.  Will eventually need a SNF.  Stage 1  sacral decubitus ulcer-Present on admission.  Encouraging out of bed with PT    Code Status: Full Code.  Family Communication:  No family Disposition Plan: Tx to inpatient psychiatry facility when bed available.   Consultants:  Psych  Procedures: ECHO: Normal Antibiotics:  Ceftriaxone 10/20-10/22.  Cipro 10/22-10/26    Objective: Filed Vitals:   01/11/15 0853  BP: 120/50  Pulse: 75  Temp: 98.1 F (36.7 C)  Resp: 16    Intake/Output Summary (Last 24 hours) at 01/11/15 1539 Last data filed at 01/11/15 1315  Gross per 24 hour  Intake    600 ml  Output      0 ml  Net    600 ml   Filed Weights   01/08/15 0930 01/11/15 1200  Weight: 47.3 kg (104 lb 4.4 oz) 48 kg (105 lb 13.1 oz)    Exam: No change from previous day  General:  Alert & oriented, NAD  Cardiovascular: RRR S1S2  Respiratory: CTA  Abdomen: Soft, NT, ND hypoactive bowel sounds  Musculoskeletal: no edema  Data Reviewed: Basic Metabolic Panel:  Recent Labs Lab 01/07/15 1018 01/08/15 0526 01/09/15 0600  NA 136 137 139  K 4.3 3.5 3.6  CL 102 105 106  CO2 _0 GLUCOSE 139* 94 83  BUN 26* 16 10  CREATININE 0.82 0.63 0.42*  CALCIUM 10.2 9.3 9.2   Liver Function Tests:  Recent Labs Lab 01/07/15 1018 01/09/15 0600  AST 41 32  ALT 34 20  ALKPHOS 157* 106  BILITOT 2.1* 0.3  PROT 7.9 6.0*  ALBUMIN 4.1 3.0*   No results  for input(s): LIPASE, AMYLASE in the last 168 hours. No results for input(s): AMMONIA in the last 168 hours. CBC:  Recent Labs Lab 01/07/15 1018 01/08/15 0526 01/09/15 0600  WBC 14.7* 8.6 7.8  NEUTROABS 11.8*  --   --   HGB 12.6 10.2* 10.1*  HCT 40.9 33.5* 33.3*  MCV 76.2* 77.5* 78.4  PLT 473* 379 364   Cardiac Enzymes: No results for input(s): CKTOTAL, CKMB, CKMBINDEX, TROPONINI in the last 168 hours. BNP (last 3 results)  Recent Labs  10/01/14 2100 10/02/14 0905 01/07/15 1018  BNP 46.8 52.3 36.4    ProBNP (last 3 results) No results for  input(s): PROBNP in the last 8760 hours.  CBG: No results for input(s): GLUCAP in the last 168 hours.  Recent Results (from the past 240 hour(s))  Urine culture     Status: None   Collection Time: 01/07/15 10:32 AM  Result Value Ref Range Status   Specimen Description URINE, CATHETERIZED  Final   Special Requests NONE  Final   Culture   Final    >=100,000 COLONIES/mL PROTEUS MIRABILIS Performed at [facility]    Report Status 01/09/2015 FINAL  Final   Organism ID, Bacteria PROTEUS MIRABILIS  Final      Susceptibility   Proteus mirabilis - MIC*    AMPICILLIN <=2 SENSITIVE Sensitive     CEFAZOLIN <=4 SENSITIVE Sensitive     CEFTRIAXONE <=1 SENSITIVE Sensitive     CIPROFLOXACIN <=0.25 SENSITIVE Sensitive     GENTAMICIN <=1 SENSITIVE Sensitive     IMIPENEM 4 SENSITIVE Sensitive     NITROFURANTOIN 128 RESISTANT Resistant     TRIMETH/SULFA <=20 SENSITIVE Sensitive     AMPICILLIN/SULBACTAM <=2 SENSITIVE Sensitive     PIP/TAZO <=4 SENSITIVE Sensitive     * >=100,000 COLONIES/mL PROTEUS MIRABILIS     Studies: No results found.  Scheduled Meds: .  stroke: mapping our early stages of recovery book   Does not apply Once  . ARIPiprazole  10 mg Oral Daily  . carvedilol  3.125 mg Oral BID WC  . ciprofloxacin  500 mg Oral BID  . feeding supplement (ENSURE ENLIVE)  237 mL Oral TID BM  . polyethylene glycol  17 g Oral Daily  . QUEtiapine  100 mg Oral Daily  . Rivaroxaban  15 mg Oral Q supper   Continuous Infusions:   Principal Problem:   Suicidal ideation Active Problems:   Bipolar disorder (HCC)   HTN (hypertension)   DVT (deep venous thrombosis) (HCC)   A-fib (HCC)   UTI (lower urinary tract infection)   Chronic diastolic heart failure (HCC)   Feeling suicidal   Lower extremity weakness   UTI (urinary tract infection)   Pressure ulcer    Time spent: 10 minutes.     [facility] Pager [phone]. If 7PM-7AM, please contact  night-coverage at [email or web address], password [facility] 01/11/2015, 3:39 PM  LOS: 4 days

## 2015-01-11 NOTE — Progress Notes (Signed)
PT Cancellation Note  Patient Details Name: Shannon Hurst MRN: [id number] DOB: [birth year]   Cancelled Treatment:    Reason Eval/Treat Not Completed: Fatigue/lethargy limiting ability to participate (pt adamantly refused therapy, stated she needs to sleep right now. Will attempt later today. )   Tamala SerUhlenberg, Bayani Renteria Kistler 01/11/2015, 9:20 AM [phone]

## 2015-01-11 NOTE — Care Management Important Message (Signed)
Important Message  Patient Details  Name: Shannon Hurst MRN: [id number] Date of Birth: 09-20-1940   Medicare Important Message Given:  Yes-second notification given    Haskell FlirtJamison, Dmitry Macomber 01/11/2015, 12:33 PMImportant Message  Patient Details  Name: Shannon Hurst MRN: [id number] Date of Birth: 09-20-1940   Medicare Important Message Given:  Yes-second notification given    Haskell FlirtJamison, Milarose Savich 01/11/2015, 12:33 PM

## 2015-01-11 NOTE — Progress Notes (Signed)
[facility]- Left voicemail at 4:01pm regarding referral. [facility]- 10/23 due to medical  [facility]. [facility]- at capacity.   Fernande BoydenJoyce Eugenie Harewood, [facility] Surgery CenterCSWA Clinical Social Worker [facility] [phone]

## 2015-01-12 DIAGNOSIS — R45851 Suicidal ideations: Secondary | ICD-10-CM

## 2015-01-12 DIAGNOSIS — N390 Urinary tract infection, site not specified: Secondary | ICD-10-CM | POA: Diagnosis not present

## 2015-01-12 DIAGNOSIS — I480 Paroxysmal atrial fibrillation: Secondary | ICD-10-CM

## 2015-01-12 DIAGNOSIS — I5032 Chronic diastolic (congestive) heart failure: Secondary | ICD-10-CM | POA: Diagnosis not present

## 2015-01-12 DIAGNOSIS — F319 Bipolar disorder, unspecified: Secondary | ICD-10-CM | POA: Diagnosis not present

## 2015-01-12 DIAGNOSIS — R531 Weakness: Secondary | ICD-10-CM | POA: Diagnosis not present

## 2015-01-12 MED ORDER — QUETIAPINE FUMARATE 100 MG PO TABS
100.0000 mg | ORAL_TABLET | Freq: Two times a day (BID) | ORAL | Status: DC
  Administered 2015-01-12 – 2015-01-17 (×10): 100 mg via ORAL
  Filled 2015-01-12 (×11): qty 1

## 2015-01-12 NOTE — Consult Note (Signed)
[facility] Face-to-Face Psychiatry Consult   Reason for Consult:  Bipolar and suicide ideation Referring Physician:  Dr. Carmell Austriaegaldo Patient Identification: Shannon PolesGoldie Adams Tiffany MRN:  [id number] Principal Diagnosis: Suicidal ideation Diagnosis:   Patient Active Problem List   Diagnosis Date Noted  . Pressure ulcer [L89.90] 01/09/2015  . UTI (urinary tract infection) [N39.0] 01/08/2015  . UTI (lower urinary tract infection) [N39.0] 01/07/2015  . Chronic diastolic heart failure (HCC) [I50.32] 01/07/2015  . Feeling suicidal [F48.9] 01/07/2015  . Lower extremity weakness [R29.898] 01/07/2015  . Suicidal ideation [R45.851] 01/07/2015  . Atrial fibrillation with rapid ventricular response (HCC) [I48.91] 10/02/2014  . Bipolar disorder (HCC) [F31.9] 10/02/2014  . HTN (hypertension) [I10] 10/02/2014  . Scab [R23.4] 10/02/2014  . (HFpEF) heart failure with preserved ejection fraction (HCC) [I50.30] 10/02/2014  . Hypertension [I10] 10/02/2014  . DVT (deep venous thrombosis) (HCC) [I82.409] 10/02/2014  . A-fib (HCC) [I48.91] 10/02/2014  . GERD (gastroesophageal reflux disease) [K21.9]   . Mood disorder (HCC) [F39]     Total Time spent with patient: 1 hour  Subjective:   Shannon Hurst is a 74 y.o. female patient admitted with Bipolar and suicide ideation.  HPI:  Shannon Hurst is a 74 y.o. female seen and chart reviewed. Patient complained of increased nerves, depression and suicide ideations. Reportedly she has been sick emotionally and having suicide thoughts and asking to be placed in mental health hospital.She can not walk and has poor ADL's. [place] and came back by bus and than [facility].   Interval history: Patient seen today for psychiatric consultation follow-up. Patient appeared sitting in her bed, awake, alert and oriented to time place person. Patient continued to endorse symptoms of depression and suicidal ideation without  intention or plan. Patient is worried about placed leave as she was not in contact with anybody in local community.   Past Psychiatric History: She has history of bipolar disorder and and multiple acute psych admission.  Risk to Self: Is patient at risk for suicide?: Yes Risk to Others:   Prior Inpatient Therapy:   Prior Outpatient Therapy:    Past Medical History:  Past Medical History  Diagnosis Date  . GERD (gastroesophageal reflux disease)   . Mood disorder (HCC)   . Depression   . Difficulty swallowing   . Hypertension   . H/O suicide attempt     attempted to hang self  . DVT (deep venous thrombosis) (HCC)   . Atrial fibrillation [facility](HCC)     Past Surgical History  Procedure Laterality Date  . Hip surgery    . Arm surgery     . Abdominal hysterectomy     Family History:  Family History  Problem Relation Age of Onset  . Heart attack Mother    Family Psychiatric  History: unknown Social History:  History  Alcohol Use No     History  Drug Use No    Social History   Social History  . Marital Status: Divorced    Spouse Name: N/A  . Number of Children: N/A  . Years of Education: N/A   Social History Main Topics  . Smoking status: Never Smoker   . Smokeless tobacco: None  . Alcohol Use: No  . Drug Use: No  . Sexual Activity: No   Other Topics Concern  . None   Social History Narrative   Lives in a group home      [place], case coordinator, [phone]  Tiffany, aid at home      [facility] - for her legs   Havery Moros yesterday to say her lights were not on (no $ for light bill) and told Annice Pih she was going somewhere but would not say where. Called Annice Pih today to say she was in the hospital. Per Annice Pih, "exaggerates and lies"      Hx of Dementia      Tiffany cooks for her   Needs Help w/ shower/bathroom   No drive - walker and bus    HCPA herself, no living will   Additional Social History:                           Allergies:   Allergies  Allergen Reactions  . Dalmane [Flurazepam Hcl] Rash  . Penicillins Rash    Has patient had a PCN reaction causing immediate rash, facial/tongue/throat swelling, SOB or lightheadedness with hypotension: Yes Has patient had a PCN reaction causing severe rash involving mucus membranes or skin necrosis: Yes Has patient had a PCN reaction that required hospitalization Has patient had a PCN reaction occurring within the last 10 years:  If all of the above answers are "NO", then may proceed with Cephalosporin use.   . Sulfa Antibiotics Rash    Labs:  No results found for this or any previous visit (from the past 48 hour(s)).  Current Facility-Administered Medications  Medication Dose Route Frequency Provider Last Rate Last Dose  .  stroke: mapping our early stages of recovery book   Does not apply Once Hollice Espy, MD      . acetaminophen (TYLENOL) tablet 650 mg  650 mg Oral Q6H PRN Hollice Espy, MD   650 mg at 01/12/15 1610   Or  . acetaminophen (TYLENOL) suppository 650 mg  650 mg Rectal Q6H PRN Hollice Espy, MD      . ARIPiprazole (ABILIFY) tablet 10 mg  10 mg Oral Daily Hollice Espy, MD   10 mg at 01/12/15 1122  . carvedilol (COREG) tablet 3.125 mg  3.125 mg Oral BID WC Hollice Espy, MD   3.125 mg at 01/12/15 0829  . ciprofloxacin (CIPRO) tablet 500 mg  500 mg Oral BID Hollice Espy, MD   500 mg at 01/12/15 0830  . feeding supplement (ENSURE ENLIVE) (ENSURE ENLIVE) liquid 237 mL  237 mL Oral TID BM Hollice Espy, MD   237 mL at 01/11/15 2016  . ondansetron (ZOFRAN) tablet 4 mg  4 mg Oral Q6H PRN Hollice Espy, MD       Or  . ondansetron (ZOFRAN) injection 4 mg  4 mg Intravenous Q6H PRN Hollice Espy, MD      . polyethylene glycol (MIRALAX / GLYCOLAX) packet 17 g  17 g Oral Daily Hollice Espy, MD   17 g at 01/10/15 0951  . QUEtiapine (SEROQUEL) tablet 100 mg  100 mg Oral Daily Hollice Espy, MD   100 mg at  01/12/15 1122  . Rivaroxaban (XARELTO) tablet 15 mg  15 mg Oral Q supper Hollice Espy, MD   15 mg at 01/11/15 1728    Musculoskeletal: Strength & Muscle Tone: decreased Gait & Station: unable to stand Patient leans: N/A  Psychiatric Specialty Exam: ROS sick, weak, tired.   Blood pressure 121/66, pulse 78, temperature 98.1 F (36.7 C), temperature source Oral, resp. rate 18, height 5' (1.524 m), weight 48 kg (105 lb  13.1 oz), SpO2 99 %.Body mass index is 20.67 kg/(m^2).  General Appearance: Guarded  Eye Contact::  Good  Speech:  Slow  Volume:  Decreased  Mood:  Anxious and Depressed  Affect:  Constricted and Depressed  Thought Process:  Coherent and Goal Directed  Orientation:  Full (Time, Place, and Person)  Thought Content:  Rumination  Suicidal Thoughts:  Yes.  without intent/plan  Homicidal Thoughts:  No  Memory:  Immediate;   Fair Recent;   Fair  Judgement:  Fair  Insight:  Fair  Psychomotor Activity:  Decreased  Concentration:  Fair  Recall:  [employer] of Knowledge:Fair  Language: Good  Akathisia:  Negative  Handed:  Right  AIMS (if indicated):     Assets:  Communication Skills Desire for Improvement Leisure Time Resilience  ADL's:  Impaired  Cognition: Impaired,  Mild  Sleep:      Treatment Plan Summary: Daily contact with patient to assess and evaluate symptoms and progress in treatment and Medication management  Disposition: Changes Seroquel 100 mg twice daily and discontinue Abilify Refer to Psych SW regarding geriatric placement Recommend psychiatric Inpatient admission when medically cleared. Supportive therapy provided about ongoing stressors.  Appreciate psychiatric consultation and follow up as clinically required Please contact [phone] or [phone] if needs further assistance  Alexios Keown,JANARDHAHA R. 01/12/2015 4:00 PM

## 2015-01-12 NOTE — Care Management Note (Signed)
Case Management Note  Patient Details  Name: Rosebud PolesGoldie Adams Dalal MRN: [id number] Date of Birth: 12/09/1940  Subjective/Objective:                    Action/Plan:Date: January 12, 2015 Chart reviewed for concurrent status and case management needs. Will continue to follow patient for changes and needs: Marcelle Smilinghonda Gazella Anglin, RN, BSN, [place]   [phone]   Expected Discharge Date:   (UNKNOWN)               Expected Discharge Plan:     In-House Referral:     Discharge planning Services     Post Acute Care Choice:    Choice offered to:     DME Arranged:    DME Agency:     HH Arranged:    HH Agency:     Status of Service:     Medicare Important Message Given:  Yes-second notification given Date Medicare IM Given:    Medicare IM give by:    Date Additional Medicare IM Given:    Additional Medicare Important Message give by:     If discussed at Long Length of Stay Meetings, dates discussed:  01/12/2015  Additional Comments:  Golda AcreDavis, Dashawn Golda Lynn, RN 01/12/2015, 10:51 AM

## 2015-01-12 NOTE — Progress Notes (Signed)
[facility] PROGRESS NOTE  Shannon Hurst LGX:[id number] DOB: [birth year] DOA: 01/07/2015 PCP: Antonietta Jewel, MD  Assessment/Plan:  Shannon Hurst is a 74 y.o. female  With past history of depression and DVT on Xarelto who was hospitalized in [place] for what reportedly was a suicide attempt. Patient was felt to be stable, discharged given a train ticket back to her home in [place] and then told to go to an ER there. (While she was in [place], patient lost her apartment ) Upon arrival in the emergency room on , 10/20, patient was complaining of some overall weakness and she was found to have a moderate UTI with a mild leukocytosis. She reported active suicidal ideation and hospitalists were called for further evaluation.  MRI done noted no evidence at all of CVA.  Patient's lower extremity weakness looks to be more of deconditioning & patient subjection.  Seen by psychiatry who started her on Abilify who feel that patient needs active inpatient psychiatric management.  Patient since responded to IV rocephin & with normal WBC now, medically stable.  She's been changed over to by mouth antibiotics. Patient continues to report suicidal ideations.  No complaints today.  1-UTI;  Presents with leukocytosis, UA with too numerous to count WBC.  Pan sensitive Proteus Change to po Cipro-course will be completed on 10/26  Hypotension;  Probably from dehydration and poor po intake. Resolved. . Lactic acid normal.   Hyperbilirubinemia, elevated Alk phosphatase:  Likely in the setting of acute illness from infection. Resolved.    HTN;  Well controlled.   Bioplar, depression, suicidal thought. Psych consulted.  Sitter at bedside.  Plan for inpatient hospitalization.   DVT; Continue with xarelto.   A-fib (HCC) paroxysmal: On Xarelto. On Coreg  cardizem on hold for hypotension on admission, plan to resume it on discharge.   Lower extremity weakness:  Physical therapy  eval.   Will eventually need a SNF.  Stage 1 sacral decubitus ulcer-Present on admission.  Encouraging out of bed with PT    Code Status: Full Code.  Family Communication:  No family Disposition Plan: Tx to inpatient psychiatry facility when bed available.   Consultants:  Psych  Procedures: ECHO: Normal Antibiotics:  Ceftriaxone 10/20-10/22.  Cipro 10/22-10/26    Objective: Filed Vitals:   01/12/15 0642  BP: 121/66  Pulse: 78  Temp: 98.1 F (36.7 C)  Resp: 18    Intake/Output Summary (Last 24 hours) at 01/12/15 1651 Last data filed at 01/12/15 1300  Gross per 24 hour  Intake    540 ml  Output      0 ml  Net    540 ml   Filed Weights   01/08/15 0930 01/11/15 1200  Weight: 47.3 kg (104 lb 4.4 oz) 48 kg (105 lb 13.1 oz)    Exam:   General:  Alert & oriented, NAD  Cardiovascular: RRR S1S2  Respiratory: CTA  Abdomen: Soft, NT, ND hypoactive bowel sounds  Musculoskeletal: no edema  Data Reviewed: Basic Metabolic Panel:  Recent Labs Lab 01/07/15 1018 01/08/15 0526 01/09/15 0600  NA 136 137 139  K 4.3 3.5 3.6  CL 102 105 106  CO2 _0 GLUCOSE 139* 94 83  BUN 26* 16 10  CREATININE 0.82 0.63 0.42*  CALCIUM 10.2 9.3 9.2   Liver Function Tests:  Recent Labs Lab 01/07/15 1018 01/09/15 0600  AST 41 32  ALT 34 20  ALKPHOS 157* 106  BILITOT 2.1* 0.3  PROT 7.9 6.0*  ALBUMIN 4.1  3.0*   No results for input(s): LIPASE, AMYLASE in the last 168 hours. No results for input(s): AMMONIA in the last 168 hours. CBC:  Recent Labs Lab 01/07/15 1018 01/08/15 0526 01/09/15 0600  WBC 14.7* 8.6 7.8  NEUTROABS 11.8*  --   --   HGB 12.6 10.2* 10.1*  HCT 40.9 33.5* 33.3*  MCV 76.2* 77.5* 78.4  PLT 473* 379 364   Cardiac Enzymes: No results for input(s): CKTOTAL, CKMB, CKMBINDEX, TROPONINI in the last 168 hours. BNP (last 3 results)  Recent Labs  10/01/14 2100 10/02/14 0905 01/07/15 1018  BNP 46.8 52.3 36.4    ProBNP (last 3  results) No results for input(s): PROBNP in the last 8760 hours.  CBG: No results for input(s): GLUCAP in the last 168 hours.  Recent Results (from the past 240 hour(s))  Urine culture     Status: None   Collection Time: 01/07/15 10:32 AM  Result Value Ref Range Status   Specimen Description URINE, CATHETERIZED  Final   Special Requests NONE  Final   Culture   Final    >=100,000 COLONIES/mL PROTEUS MIRABILIS Performed at [facility]    Report Status 01/09/2015 FINAL  Final   Organism ID, Bacteria PROTEUS MIRABILIS  Final      Susceptibility   Proteus mirabilis - MIC*    AMPICILLIN <=2 SENSITIVE Sensitive     CEFAZOLIN <=4 SENSITIVE Sensitive     CEFTRIAXONE <=1 SENSITIVE Sensitive     CIPROFLOXACIN <=0.25 SENSITIVE Sensitive     GENTAMICIN <=1 SENSITIVE Sensitive     IMIPENEM 4 SENSITIVE Sensitive     NITROFURANTOIN 128 RESISTANT Resistant     TRIMETH/SULFA <=20 SENSITIVE Sensitive     AMPICILLIN/SULBACTAM <=2 SENSITIVE Sensitive     PIP/TAZO <=4 SENSITIVE Sensitive     * >=100,000 COLONIES/mL PROTEUS MIRABILIS     Studies: No results found.  Scheduled Meds: .  stroke: mapping our early stages of recovery book   Does not apply Once  . carvedilol  3.125 mg Oral BID WC  . ciprofloxacin  500 mg Oral BID  . feeding supplement (ENSURE ENLIVE)  237 mL Oral TID BM  . polyethylene glycol  17 g Oral Daily  . QUEtiapine  100 mg Oral BID  . Rivaroxaban  15 mg Oral Q supper   Continuous Infusions:   Principal Problem:   Suicidal ideation Active Problems:   Bipolar disorder (HCC)   HTN (hypertension)   DVT (deep venous thrombosis) (HCC)   A-fib (HCC)   UTI (lower urinary tract infection)   Chronic diastolic heart failure (HCC)   Feeling suicidal   Lower extremity weakness   UTI (urinary tract infection)   Pressure ulcer    Time spent: 15 minutes.     [facility] Pager [phone] . If 7PM-7AM, please contact night-coverage at  [email or web address], password [facility] 01/12/2015, 4:51 PM  LOS: 5 days

## 2015-01-12 NOTE — Progress Notes (Signed)
CSW attempted to make [facility] with [facility] but received no answer. CSW left voice message regarding patient needing a geri-psych bed. CSW will follow up and continue to seek patient for the patient.   Fernande BoydenJoyce Marcques Wrightsman, AvalaCSWA Clinical Social Worker [facility] [phone]

## 2015-01-12 NOTE — Progress Notes (Signed)
Chaplain support at pt request.  Aurelio JewGoldie wished for chaplain to contact Zenaida N[facility]Helen McLaughlin at [facility] to inform [facility].  Aurelio JewGoldie has received support from [facility] previously and has relationship with this particular chaplain.    Chaplain contacted chaplaincy support at [facility].  With permission of pt left message on confidential voicemail regarding Mrs Woehl's admission and requested call back.   Pt also requested bible.  Chaplain provided bible to pt.    Belva CromeStalnaker, Jacorion Klem Wayne MDiv

## 2015-01-12 NOTE — Progress Notes (Signed)
Patient ambulated >300 ft up/down entire hall with rolling walker with supervision only. Patient tolerated well.

## 2015-01-13 DIAGNOSIS — R45851 Suicidal ideations: Secondary | ICD-10-CM | POA: Diagnosis not present

## 2015-01-13 DIAGNOSIS — R531 Weakness: Secondary | ICD-10-CM | POA: Diagnosis not present

## 2015-01-13 DIAGNOSIS — F319 Bipolar disorder, unspecified: Secondary | ICD-10-CM | POA: Diagnosis not present

## 2015-01-13 DIAGNOSIS — I480 Paroxysmal atrial fibrillation: Secondary | ICD-10-CM | POA: Diagnosis not present

## 2015-01-13 DIAGNOSIS — N390 Urinary tract infection, site not specified: Secondary | ICD-10-CM | POA: Diagnosis not present

## 2015-01-13 NOTE — Progress Notes (Signed)
Date: January 13, 2015 Chart reviewed for concurrent status and case management needs. Will continue to follow patient for changes and needs: Dashiell Franchino, RN, BSN, CCM   [phone] 

## 2015-01-13 NOTE — Progress Notes (Signed)
OT Cancellation Note  Patient Details Name: Shannon Hurst MRN: [id number] DOB: [birth year]   Cancelled Treatment:    Reason Eval/Treat Not Completed: Other (comment).  Stated she walked with PT earlier. Will check back another day.  Amberli Ruegg 01/13/2015, 2:48 PM  Shannon Hurst, OTR/L [phone] 01/13/2015

## 2015-01-13 NOTE — Progress Notes (Addendum)
OT Cancellation Note  Patient Details Name: Rosebud PolesGoldie Adams Bevard MRN: [id number] DOB: Oct 01, 1940   Cancelled Treatment:    Reason Eval/Treat Not Completed: Other (comment) Pt stating she doesn't want to get up right now and states she just got up. Sitter confirms pt just up to bathroom and bathed also. Pt prefers to wait till afternoon. Will reattempt as able.  Lennox LaityStone, Tatiyanna Lashley Stafford  [phone] 01/13/2015, 11:15 AM

## 2015-01-13 NOTE — Progress Notes (Signed)
[facility] PROGRESS NOTE  Shannon Hurst WYO:[id number] DOB: 06-Dec-1940 DOA: 01/07/2015 PCP: Antonietta Jewel, MD  Assessment/Plan:  Shannon Hurst is a 74 y.o. female  With past history of depression and DVT on Xarelto who was hospitalized in [place] for what reportedly was a suicide attempt. Patient was felt to be stable, discharged given a train ticket back to her home in [place] and then told to go to an ER there. (While she was in [place], patient lost her apartment ) Upon arrival in the emergency room on , 10/20, patient was complaining of some overall weakness and she was found to have a moderate UTI with a mild leukocytosis. She reported active suicidal ideation and hospitalists were called for further evaluation.  MRI done noted no evidence at all of CVA.  Patient's lower extremity weakness looks to be more of deconditioning & patient subjection.  Seen by psychiatry who started her on Abilify who feel that patient needs active inpatient psychiatric management.  Patient since responded to IV rocephin & with normal WBC now, medically stable.  She's been changed over to by mouth antibiotics. Patient continues to report suicidal ideations.  No complaints today.  1-UTI;  Presents with leukocytosis, UA with too numerous to count WBC.  Pan sensitive Proteus Change to po Cipro-course will be completed on 10/26.  NO NEW complaints.awaiting bed at [facility].   Hypotension;  Probably from dehydration and poor po intake. Resolved. . Lactic acid normal.   Hyperbilirubinemia, elevated Alk phosphatase:  Likely in the setting of acute illness from infection. Resolved.    HTN;  Well controlled.   Bioplar, depression, suicidal thought. Psych consulted.  Sitter at bedside.  Plan for inpatient hospitalization.   DVT; Continue with xarelto.   A-fib (HCC) paroxysmal: On Xarelto. On Coreg  cardizem on hold for hypotension on admission, plan to resume it on discharge.    Lower extremity weakness:  Physical therapy eval.   Will eventually need a SNF.  Stage 1 sacral decubitus ulcer-Present on admission.  Encouraging out of bed with PT    Code Status: Full Code.  Family Communication:  No family Disposition Plan: Tx to inpatient psychiatry facility when bed available.   Consultants:  Psych  Procedures: ECHO: Normal Antibiotics:  Ceftriaxone 10/20-10/22.  Cipro 10/22-10/26    Objective: Filed Vitals:   01/13/15 1558  BP: 111/49  Pulse: 74  Temp: 97.6 F (36.4 C)  Resp: 16    Intake/Output Summary (Last 24 hours) at 01/13/15 1852 Last data filed at 01/13/15 1745  Gross per 24 hour  Intake    510 ml  Output      0 ml  Net    510 ml   Filed Weights   01/08/15 0930 01/11/15 1200  Weight: 47.3 kg (104 lb 4.4 oz) 48 kg (105 lb 13.1 oz)    Exam:   General:  Alert & oriented, NAD  Cardiovascular: RRR S1S2  Respiratory: CTA  Abdomen: Soft, NT, ND hypoactive bowel sounds  Musculoskeletal: no edema  Data Reviewed: Basic Metabolic Panel:  Recent Labs Lab 01/07/15 1018 01/08/15 0526 01/09/15 0600  NA 136 137 139  K 4.3 3.5 3.6  CL 102 105 106  CO2 _0 GLUCOSE 139* 94 83  BUN 26* 16 10  CREATININE 0.82 0.63 0.42*  CALCIUM 10.2 9.3 9.2   Liver Function Tests:  Recent Labs Lab 01/07/15 1018 01/09/15 0600  AST 41 32  ALT 34 20  ALKPHOS 157* 106  BILITOT  2.1* 0.3  PROT 7.9 6.0*  ALBUMIN 4.1 3.0*   No results for input(s): LIPASE, AMYLASE in the last 168 hours. No results for input(s): AMMONIA in the last 168 hours. CBC:  Recent Labs Lab 01/07/15 1018 01/08/15 0526 01/09/15 0600  WBC 14.7* 8.6 7.8  NEUTROABS 11.8*  --   --   HGB 12.6 10.2* 10.1*  HCT 40.9 33.5* 33.3*  MCV 76.2* 77.5* 78.4  PLT 473* 379 364   Cardiac Enzymes: No results for input(s): CKTOTAL, CKMB, CKMBINDEX, TROPONINI in the last 168 hours. BNP (last 3 results)  Recent Labs  10/01/14 2100 10/02/14 0905  01/07/15 1018  BNP 46.8 52.3 36.4    ProBNP (last 3 results) No results for input(s): PROBNP in the last 8760 hours.  CBG: No results for input(s): GLUCAP in the last 168 hours.  Recent Results (from the past 240 hour(s))  Urine culture     Status: None   Collection Time: 01/07/15 10:32 AM  Result Value Ref Range Status   Specimen Description URINE, CATHETERIZED  Final   Special Requests NONE  Final   Culture   Final    >=100,000 COLONIES/mL PROTEUS MIRABILIS Performed at [facility]    Report Status 01/09/2015 FINAL  Final   Organism ID, Bacteria PROTEUS MIRABILIS  Final      Susceptibility   Proteus mirabilis - MIC*    AMPICILLIN <=2 SENSITIVE Sensitive     CEFAZOLIN <=4 SENSITIVE Sensitive     CEFTRIAXONE <=1 SENSITIVE Sensitive     CIPROFLOXACIN <=0.25 SENSITIVE Sensitive     GENTAMICIN <=1 SENSITIVE Sensitive     IMIPENEM 4 SENSITIVE Sensitive     NITROFURANTOIN 128 RESISTANT Resistant     TRIMETH/SULFA <=20 SENSITIVE Sensitive     AMPICILLIN/SULBACTAM <=2 SENSITIVE Sensitive     PIP/TAZO <=4 SENSITIVE Sensitive     * >=100,000 COLONIES/mL PROTEUS MIRABILIS     Studies: No results found.  Scheduled Meds: .  stroke: mapping our early stages of recovery book   Does not apply Once  . carvedilol  3.125 mg Oral BID WC  . ciprofloxacin  500 mg Oral BID  . feeding supplement (ENSURE ENLIVE)  237 mL Oral TID BM  . polyethylene glycol  17 g Oral Daily  . QUEtiapine  100 mg Oral BID  . Rivaroxaban  15 mg Oral Q supper   Continuous Infusions:   Principal Problem:   Suicidal ideation Active Problems:   Bipolar disorder (HCC)   HTN (hypertension)   DVT (deep venous thrombosis) (HCC)   A-fib (HCC)   UTI (lower urinary tract infection)   Chronic diastolic heart failure (HCC)   Feeling suicidal   Lower extremity weakness   UTI (urinary tract infection)   Pressure ulcer    Time spent: 15 minutes.     [facility] Pager [phone]  . If 7PM-7AM, please contact night-coverage at [email or web address], password [facility] 01/13/2015, 6:52 PM  LOS: 6 days

## 2015-01-13 NOTE — Progress Notes (Addendum)
[facility] available beds [facility] available beds [facility]- Left voicemail at 9:32 am regarding referral. [facility]- [facility]- 10/23 due to medical  [facility]- [facility] available beds [facility]- [facility]  [facility]- reviewing clinicals. 3:44pm facility will be reviewing local referrals first.  [facility]- [facility].  [facility]- unable to get in contact with Annice PihJackie on today.  CSW will continue to follow.   Fernande BoydenJoyce Shaunn Tackitt, [facility]tr[facility]WA Clinical Social Worker [facility] [phone]

## 2015-01-13 NOTE — Progress Notes (Signed)
CSW sent referral on yesterday 10/25 to Jackie at [facility], per her request. CSW attempted to follow up with Jackie regarding bed availability but received no answer. CSW will continue to follow.  Marc Leichter, LCSWA Clinical Social Worker [place] [phone]  

## 2015-01-13 NOTE — Consult Note (Signed)
[facility] Face-to-Face Psychiatry Consult   Reason for Consult:  Bipolar and suicide ideation Referring Physician:  Dr. Carmell Austria Patient Identification: Shannon Hurst MRN:  [id number] Principal Diagnosis: Suicidal ideation Diagnosis:   Patient Active Problem List   Diagnosis Date Noted  . Pressure ulcer [L89.90] 01/09/2015  . UTI (urinary tract infection) [N39.0] 01/08/2015  . UTI (lower urinary tract infection) [N39.0] 01/07/2015  . Chronic diastolic heart failure (HCC) [I50.32] 01/07/2015  . Feeling suicidal [F48.9] 01/07/2015  . Lower extremity weakness [R29.898] 01/07/2015  . Suicidal ideation [R45.851] 01/07/2015  . Atrial fibrillation with rapid ventricular response (HCC) [I48.91] 10/02/2014  . Bipolar disorder (HCC) [F31.9] 10/02/2014  . HTN (hypertension) [I10] 10/02/2014  . Scab [R23.4] 10/02/2014  . (HFpEF) heart failure with preserved ejection fraction (HCC) [I50.30] 10/02/2014  . Hypertension [I10] 10/02/2014  . DVT (deep venous thrombosis) (HCC) [I82.409] 10/02/2014  . A-fib (HCC) [I48.91] 10/02/2014  . GERD (gastroesophageal reflux disease) [K21.9]   . Mood disorder (HCC) [F39]     Total Time spent with patient: 30 minutes  Subjective:   Shannon Hurst is a 74 y.o. female patient admitted with Bipolar and suicide ideation.  HPI:  Shannon Hurst is a 74 y.o. female seen and chart reviewed. Patient complained of increased nerves, depression and suicide ideations. Reportedly she has been sick emotionally and having suicide thoughts and asking to be placed in mental health hospital.She can not walk and has poor ADL's. She was hospitalized while in [place], [place] for two months and came back by bus and than came to [facility].   Interval history: Patient seen for psychiatric consultation follow-up. Patient appeared sitting in her bed, awake, alert and oriented to time place person. Patient has been compliant with her medication treatment and has been feeling somewhat  better today. Patient continued to endorse symptoms of depression and suicidal ideation without intention because she has no place to live, her disability was stopped and no family or friends to talk to. Patient is hoping psychiatric social service/case management should be able to help her with her psychosocial stressors. Patient is able to walk with the help of physical therapy and also walker. Patient has been talking with the staff members, nursing and checked pastor and asking to pray for her.  Past Psychiatric History: She has history of bipolar disorder and and multiple acute psych admission.  Risk to Self: Is patient at risk for suicide?: Yes Risk to Others:   Prior Inpatient Therapy:   Prior Outpatient Therapy:    Past Medical History:  Past Medical History  Diagnosis Date  . GERD (gastroesophageal reflux disease)   . Mood disorder (HCC)   . Depression   . Difficulty swallowing   . Hypertension   . H/O suicide attempt     attempted to hang self  . DVT (deep venous thrombosis) (HCC)   . Atrial fibrillation [facility])     Past Surgical History  Procedure Laterality Date  . Hip surgery    . Arm surgery     . Abdominal hysterectomy     Family History:  Family History  Problem Relation Age of Onset  . Heart attack Mother    Family Psychiatric  History: unknown Social History:  History  Alcohol Use No     History  Drug Use No    Social History   Social History  . Marital Status: Divorced    Spouse Name: N/A  . Number of Children: N/A  . Years of Education: N/A  Social History Main Topics  . Smoking status: Never Smoker   . Smokeless tobacco: None  . Alcohol Use: No  . Drug Use: No  . Sexual Activity: No   Other Topics Concern  . None   Social History Narrative   Lives in a group home      Shannon Hurst, case coordinator, [phone]   Shannon Hurst, aid at home      Shannon Hurst - for her legs   Shannon MorosCalled Shannon Hurst yesterday to say her lights were not on (no $ for  light bill) and told Shannon Hurst she was going somewhere but would not say where. Called Shannon Hurst today to say she was in the hospital. Per Shannon Hurst, "exaggerates and lies"      Hx of Dementia      Shannon Hurst cooks for her   Needs Help w/ shower/bathroom   No drive - walker and bus    HCPA herself, no living will   Additional Social History:                          Allergies:   Allergies  Allergen Reactions  . Dalmane [Flurazepam Hcl] Rash  . Penicillins Rash    Has patient had a PCN reaction causing immediate rash, facial/tongue/throat swelling, SOB or lightheadedness with hypotension: Yes Has patient had a PCN reaction causing severe rash involving mucus membranes or skin necrosis: Yes Has patient had a PCN reaction that required hospitalization Has patient had a PCN reaction occurring within the last 10 years:  If all of the above answers are "NO", then may proceed with Cephalosporin use.   . Sulfa Antibiotics Rash    Labs:  No results found for this or any previous visit (from the past 48 hour(s)).  Current Facility-Administered Medications  Medication Dose Route Frequency Provider Last Rate Last Dose  .  stroke: mapping Shannon early stages of recovery book   Does not apply Once Hollice EspySendil K Krishnan, MD      . acetaminophen (TYLENOL) tablet 650 mg  650 mg Oral Q6H PRN Hollice EspySendil K Krishnan, MD   650 mg at 01/12/15 2344   Or  . acetaminophen (TYLENOL) suppository 650 mg  650 mg Rectal Q6H PRN Hollice EspySendil K Krishnan, MD      . carvedilol (COREG) tablet 3.125 mg  3.125 mg Oral BID WC Hollice EspySendil K Krishnan, MD   3.125 mg at 01/13/15 0924  . ciprofloxacin (CIPRO) tablet 500 mg  500 mg Oral BID Hollice EspySendil K Krishnan, MD   500 mg at 01/13/15 0924  . feeding supplement (ENSURE ENLIVE) (ENSURE ENLIVE) liquid 237 mL  237 mL Oral TID BM Hollice EspySendil K Krishnan, MD   237 mL at 01/13/15 0925  . ondansetron (ZOFRAN) tablet 4 mg  4 mg Oral Q6H PRN Hollice EspySendil K Krishnan, MD       Or  . ondansetron (ZOFRAN) injection 4 mg   4 mg Intravenous Q6H PRN Hollice EspySendil K Krishnan, MD      . polyethylene glycol (MIRALAX / GLYCOLAX) packet 17 g  17 g Oral Daily Hollice EspySendil K Krishnan, MD   17 g at 01/10/15 0951  . QUEtiapine (SEROQUEL) tablet 100 mg  100 mg Oral BID Leata MouseJanardhana Corneisha Alvi, MD   100 mg at 01/13/15 0924  . Rivaroxaban (XARELTO) tablet 15 mg  15 mg Oral Q supper Hollice EspySendil K Krishnan, MD   15 mg at 01/12/15 1813    Musculoskeletal: Strength & Muscle Tone: decreased Gait & Station: unable to stand  Patient leans: N/A  Psychiatric Specialty Exam: ROS sick, weak, tired.   Blood pressure 112/54, pulse 67, temperature 97.3 F (36.3 C), temperature source Axillary, resp. rate 16, height 5' (1.524 m), weight 48 kg (105 lb 13.1 oz), SpO2 98 %.Body mass index is 20.67 kg/(m^2).  General Appearance: Guarded  Eye Contact::  Good  Speech:  Slow  Volume:  Decreased  Mood:  Anxious and Depressed  Affect:  Constricted and Depressed  Thought Process:  Coherent and Goal Directed  Orientation:  Full (Time, Place, and Person)  Thought Content:  Rumination  Suicidal Thoughts:  Yes.  without intent/plan  Homicidal Thoughts:  No  Memory:  Immediate;   Fair Recent;   Fair  Judgement:  Fair  Insight:  Fair  Psychomotor Activity:  Decreased  Concentration:  Fair  Recall:  [employer] of Knowledge:Fair  Language: Good  Akathisia:  Negative  Handed:  Right  AIMS (if indicated):     Assets:  Communication Skills Desire for Improvement Leisure Time Resilience  ADL's:  Impaired  Cognition: Impaired,  Mild  Sleep:      Treatment Plan Summary: Daily contact with patient to assess and evaluate symptoms and progress in treatment and Medication management  Disposition: Continue safety sitter Continue Seroquel 100 mg twice daily and monitor for diagnosis affects including extrapyramidal symptoms and drowsiness. Follow-up with Psych LCSW regarding geriatric psychiatry placement Recommend psychiatric Inpatient admission when  medically cleared. Supportive therapy provided about ongoing stressors.  Appreciate psychiatric consultation and follow up as clinically required Please contact [phone] or [phone] if needs further assistance  Thamara Leger,JANARDHAHA R. 01/13/2015 10:58 AM

## 2015-01-13 NOTE — Progress Notes (Signed)
Physical Therapy Treatment Patient Details Name: Shannon Hurst MRN: [id number] DOB: 12-19-1940 Today's Date: 01/13/2015    History of Present Illness 74 y.o. Female admitted with generalized weakness, and suicidal ideation. Pt found to have moderate UTI. Pt recently was hospitalized in [place] for suicide attempt, and given train ticket back to [place] (per her report). PMH includes: Bipolar disorder with SI, DVT, Chronic diastolic HF    PT Comments    Pt. Got up to EOB requesting assist, however, did not physically need it; [facility] then ambulated 11150[facility] up and down hallways; some expressive aphasia noticed but still able to comprehend pt.; ended treatment with pt. In bed; pt. Likes to do and have things her way   Follow Up Recommendations  SNF;Supervision/Assistance - 24 hour     Equipment Recommendations  None recommended by PT    Recommendations for Other Services       Precautions / Restrictions Precautions Precautions: Fall;Other (comment) (SI) Restrictions Weight Bearing Restrictions: No    Mobility  Bed Mobility Overal bed mobility: Modified Independent Bed Mobility: Supine to Sit;Sit to Supine     Supine to sit: Modified independent (Device/Increase time) Sit to supine: Modified independent (Device/Increase time)   General bed mobility comments: Pt stated she needed help to get in and out of bed, however was able to perform these movements without physical assist  Transfers Overall transfer level: Needs assistance Equipment used: 4-wheeled walker Transfers: Sit to/from Stand Sit to Stand: Min guard;Supervision         General transfer comment: supervision - min/guard for safety  Ambulation/Gait Ambulation/Gait assistance: Supervision;Min guard Ambulation Distance (Feet): 150 Feet Assistive device: 4-wheeled walker Gait Pattern/deviations: Step-through pattern;Trunk flexed     General Gait Details: steady with rollator, no LOB, pt  is incontinent of urine so use of maxi pad and hosptial undergarments    Stairs            Wheelchair Mobility    Modified Rankin (Stroke Patients Only)       Balance                                    Cognition Arousal/Alertness: Awake/alert Behavior During Therapy:  (patient prefers things done her way ) Overall Cognitive Status: No family/caregiver present to determine baseline cognitive functioning                 General Comments: She follows one step commands consistently; expressive aphasia noticeable     Exercises      General Comments        Pertinent Vitals/Pain Pain Assessment: No/denies pain    Home Living                      Prior Function            PT Goals (current goals can now be found in the care plan section) Acute Rehab PT Goals Patient Stated Goal: did not state Time For Goal Achievement: 01/22/15 Potential to Achieve Goals: Fair Progress towards PT goals: Progressing toward goals    Frequency  Min 3X/week    PT Plan Current plan remains appropriate    Co-evaluation             End of Session Equipment Utilized During Treatment: Gait belt Activity Tolerance: Patient tolerated treatment well;No increased pain Patient left: in bed;with call bell/phone within reach;with nursing/sitter  in room     Time:  - 14;05- 14:25    Charges:    1 gt                   G CodesMarin Comment 01/13/2015, 3:31 PM  Felecia Shelling  PTA [facility]  Acute  Rehab Pager      [phone]

## 2015-01-14 ENCOUNTER — Encounter (HOSPITAL_COMMUNITY): Payer: Self-pay | Admitting: Student

## 2015-01-14 DIAGNOSIS — N390 Urinary tract infection, site not specified: Secondary | ICD-10-CM | POA: Diagnosis not present

## 2015-01-14 DIAGNOSIS — R45851 Suicidal ideations: Secondary | ICD-10-CM | POA: Diagnosis not present

## 2015-01-14 DIAGNOSIS — R531 Weakness: Secondary | ICD-10-CM | POA: Diagnosis not present

## 2015-01-14 DIAGNOSIS — F319 Bipolar disorder, unspecified: Secondary | ICD-10-CM | POA: Diagnosis not present

## 2015-01-14 NOTE — Progress Notes (Signed)
[facility] PROGRESS NOTE  Jonna Dittrich Aydelotte VHQ:[id number] DOB: 1940-08-26 DOA: 01/07/2015 PCP: Antonietta Jewel, MD  Assessment/Plan:  Shannon Hurst is a 74 y.o. female  With past history of depression and DVT on Xarelto who was hospitalized in [place] for what reportedly was a suicide attempt. Patient was felt to be stable, discharged given a train ticket back to her home in [place] and then told to go to an ER there. (While she was in [place], patient lost her apartment ) Upon arrival in the emergency room on , 10/20, patient was complaining of some overall weakness and she was found to have a moderate UTI with a mild leukocytosis. She reported active suicidal ideation and hospitalists were called for further evaluation.  MRI done noted no evidence at all of CVA.  Patient's lower extremity weakness looks to be more of deconditioning & patient subjection.  Seen by psychiatry who started her on Abilify who feel that patient needs active inpatient psychiatric management.  Patient since responded to IV rocephin & with normal WBC now, medically stable.  She's been changed over to by mouth antibiotics. Patient continues to report suicidal ideations.  No complaints today.  1-UTI;  Presents with leukocytosis, UA with too numerous to count WBC.  Pan sensitive Proteus Change to po Cipro-course will be completed on 10/26.  NO NEW complaints.awaiting bed at [facility].   Hypotension;  Probably from dehydration and poor po intake. Resolved. . Lactic acid normal.   Hyperbilirubinemia, elevated Alk phosphatase:  Likely in the setting of acute illness from infection. Resolved.    HTN;  Well controlled.   Bioplar, depression, suicidal thought. Psych consulted.  Sitter at bedside.  Plan for inpatient hospitalization.   DVT; Continue with xarelto.   A-fib (HCC) paroxysmal: On Xarelto. On Coreg  cardizem on hold for hypotension on admission, plan to resume it on discharge.    Lower extremity weakness:  Physical therapy eval.   Will eventually need a SNF.  Stage 1 sacral decubitus ulcer-Present on admission.  Encouraging out of bed with PT    Code Status: Full Code.  Family Communication:  No family Disposition Plan: Tx to inpatient psychiatry facility when bed available.   Consultants:  Psych  Procedures: ECHO: Normal Antibiotics:  Ceftriaxone 10/20-10/22.  Cipro 10/22-10/26    Objective: Filed Vitals:   01/14/15 1400  BP: 103/42  Pulse: 77  Temp: 97.7 F (36.5 C)  Resp: 16   No intake or output data in the 24 hours ending 01/14/15 1847 Filed Weights   01/08/15 0930 01/11/15 1200  Weight: 47.3 kg (104 lb 4.4 oz) 48 kg (105 lb 13.1 oz)    Exam:   General:  Alert & oriented, NAD  Cardiovascular: RRR S1S2  Respiratory: CTA  Abdomen: Soft, NT, ND hypoactive bowel sounds  Musculoskeletal: no edema  Data Reviewed: Basic Metabolic Panel:  Recent Labs Lab 01/08/15 0526 01/09/15 0600  NA 137 139  K 3.5 3.6  CL 105 106  CO2 27 25  GLUCOSE 94 83  BUN 16 10  CREATININE 0.63 0.42*  CALCIUM 9.3 9.2   Liver Function Tests:  Recent Labs Lab 01/09/15 0600  AST 32  ALT 20  ALKPHOS 106  BILITOT 0.3  PROT 6.0*  ALBUMIN 3.0*   No results for input(s): LIPASE, AMYLASE in the last 168 hours. No results for input(s): AMMONIA in the last 168 hours. CBC:  Recent Labs Lab 01/08/15 0526 01/09/15 0600  WBC 8.6 7.8  HGB 10.2* 10.1*  HCT 33.5* 33.3*  MCV 77.5* 78.4  PLT 379 364   Cardiac Enzymes: No results for input(s): CKTOTAL, CKMB, CKMBINDEX, TROPONINI in the last 168 hours. BNP (last 3 results)  Recent Labs  10/01/14 2100 10/02/14 0905 01/07/15 1018  BNP 46.8 52.3 36.4    ProBNP (last 3 results) No results for input(s): PROBNP in the last 8760 hours.  CBG: No results for input(s): GLUCAP in the last 168 hours.  Recent Results (from the past 240 hour(s))  Urine culture     Status: None    Collection Time: 01/07/15 10:32 AM  Result Value Ref Range Status   Specimen Description URINE, CATHETERIZED  Final   Special Requests NONE  Final   Culture   Final    >=100,000 COLONIES/mL PROTEUS MIRABILIS Performed at [facility]    Report Status 01/09/2015 FINAL  Final   Organism ID, Bacteria PROTEUS MIRABILIS  Final      Susceptibility   Proteus mirabilis - MIC*    AMPICILLIN <=2 SENSITIVE Sensitive     CEFAZOLIN <=4 SENSITIVE Sensitive     CEFTRIAXONE <=1 SENSITIVE Sensitive     CIPROFLOXACIN <=0.25 SENSITIVE Sensitive     GENTAMICIN <=1 SENSITIVE Sensitive     IMIPENEM 4 SENSITIVE Sensitive     NITROFURANTOIN 128 RESISTANT Resistant     TRIMETH/SULFA <=20 SENSITIVE Sensitive     AMPICILLIN/SULBACTAM <=2 SENSITIVE Sensitive     PIP/TAZO <=4 SENSITIVE Sensitive     * >=100,000 COLONIES/mL PROTEUS MIRABILIS     Studies: No results found.  Scheduled Meds: .  stroke: mapping our early stages of recovery book   Does not apply Once  . carvedilol  3.125 mg Oral BID WC  . feeding supplement (ENSURE ENLIVE)  237 mL Oral TID BM  . polyethylene glycol  17 g Oral Daily  . QUEtiapine  100 mg Oral BID  . Rivaroxaban  15 mg Oral Q supper   Continuous Infusions:   Principal Problem:   Suicidal ideation Active Problems:   Bipolar disorder (HCC)   HTN (hypertension)   DVT (deep venous thrombosis) (HCC)   A-fib (HCC)   UTI (lower urinary tract infection)   Chronic diastolic heart failure (HCC)   Feeling suicidal   Lower extremity weakness   UTI (urinary tract infection)   Pressure ulcer    Time spent: 15 minutes.     [facility] Pager [phone] . If 7PM-7AM, please contact night-coverage at [email or web address], password [facility] 01/14/2015, 6:47 PM  LOS: 7 days

## 2015-01-14 NOTE — Care Management Important Message (Signed)
Important Message  Patient Details  Name: Shannon Hurst MRN: [id number] Date of Birth: September 25, 1940   Medicare Important Message Given:  Yes-second notification given    Renie OraHawkins, Versie Soave Smith 01/14/2015, 2:46 PMImportant Message  Patient Details  Name: Shannon Hurst MRN: [id number] Date of Birth: September 25, 1940   Medicare Important Message Given:  Yes-second notification given    Renie OraHawkins, Aalayah Riles Smith 01/14/2015, 2:46 PM

## 2015-01-14 NOTE — Clinical Social Work Note (Signed)
CSW met with pt to assess for discharge needs.  CSW prompted pt to discuss her history and needs.  CSW provided explanation of services available for pt at discharge.  CSW encouraged pt to explore her thoughts and feeling related to her home life, PT needs and mental health needs. Pt continued to state that she wanted to go to [place] to the house she grew up in. Pt stated no to SNF and inpatient facility.   Pt provided her nephew ken's phone number [phone] for support and collaterals.  CSW called and spoke with nephew's wife Arbie Cookey who provided the following information:  [name] family has not spoken to her in awhile and did not know where she was.  [name] family received calls from [place] where pt was in a hospital and then a psych hospital.  [name] family stated that pt does not want anyone to help her and she wants to be able to get on a bus and go places.  Pt does not want to be "tied down" and has denied any help from her family which only consists of a nephew. [name] sister passed away a few years ago and she used to care for pt.  Pt is her own guardian.  Pt has no known history of drug or alcohol use and no dementia diagnosis in past.  Pt has history of not paying her rent and leaving all of her furniture in her apartments.  Pt has no known history of suicide attempts per the family.    Pt did not like the idea of gong to any facility (SNF or inpatient psych).  CSW will continue to meet with pt, review psych recommendations, PT recommendations and find a placement when pt is ready for discharge.  Dede Query, LCSW [place] Worker - Weekend Coverage cell #: [phone]

## 2015-01-14 NOTE — Clinical Social Work Note (Signed)
CSW called the following facilities for inpatient placement:  [facility]: spoke with Annice PihJackie who stated that they have no beds today and do not anticipate any until next week.  Asked to call back on Monday  [facility]:  Spoke with Cedric who stated that he had not reviewed referral yet and he would be reviewing to call back this afternoon  [facility]:  Had to leave a voicemail  [facility]:  Spoke with cindy who stated that they had not had time to review they are back logged.  She stated that they will be reviewing and accepting local referrals first.   [facility]:  Spoke with Britta MccreedyBarbara who stated that they had no beds and she would know more at end of day regarding availability tomorrow.  She stated that they had 4 people waiting in their ED.  [facility]:  Pt is more appropriate for a geriactric facility  Will continue to follow up with placements  .Elray Bubaegina Etheline Geppert, LCSW [facility] Clinical Social Worker - Weekend Coverage cell #: [phone]

## 2015-01-14 NOTE — Consult Note (Signed)
[facility] Face-to-Face Psychiatry Consult   Reason for Consult:  Bipolar and suicide ideation Referring Physician:  Dr. Carmell Austria Patient Identification: Shannon Hurst MRN:  [id number] Principal Diagnosis: Suicidal ideation Diagnosis:   Patient Active Problem List   Diagnosis Date Noted  . Pressure ulcer [L89.90] 01/09/2015  . UTI (urinary tract infection) [N39.0] 01/08/2015  . UTI (lower urinary tract infection) [N39.0] 01/07/2015  . Chronic diastolic heart failure (HCC) [I50.32] 01/07/2015  . Feeling suicidal [F48.9] 01/07/2015  . Lower extremity weakness [R29.898] 01/07/2015  . Suicidal ideation [R45.851] 01/07/2015  . Atrial fibrillation with rapid ventricular response (HCC) [I48.91] 10/02/2014  . Bipolar disorder (HCC) [F31.9] 10/02/2014  . HTN (hypertension) [I10] 10/02/2014  . Scab [R23.4] 10/02/2014  . (HFpEF) heart failure with preserved ejection fraction (HCC) [I50.30] 10/02/2014  . Hypertension [I10] 10/02/2014  . DVT (deep venous thrombosis) (HCC) [I82.409] 10/02/2014  . A-fib (HCC) [I48.91] 10/02/2014  . GERD (gastroesophageal reflux disease) [K21.9]   . Mood disorder (HCC) [F39]     Total Time spent with patient: 30 minutes  Subjective:   Shannon Hurst is a 74 y.o. female patient admitted with Bipolar and suicide ideation.  HPI:  Shannon Hurst is a 74 y.o. female seen and chart reviewed. Patient complained of increased nerves, depression and suicide ideations. Reportedly she has been sick emotionally and having suicide thoughts and asking to be placed in mental health hospital.She can not walk and has poor ADL's. She was hospitalized while in [place], [place] for two months and came back by bus and than came to [facility].   Interval history: Patient seen for psychiatric consultation follow-up and safety sitter is at bed side. This is a 74 years old female with bipolar disorder with multiple acute psych admission presented with depression and generalized weakness  after travelling across the country in a bus. Patient initially stated that she spoke with social work who is working finding a place and will provide communication with people she know in local community. When informed about thinking about discharging from the hospital patient states that she is not feeling good, depressed and still has suicide thoughts today. Patient is awake, alert and oriented to time place person. Patient is hoping psychiatric social service/case management should be able to help her with her psychosocial stressors. Patient is flip flopping on her suicide ideations and symptoms of depression. She is not able to contract for safety today.    Past Psychiatric History: She has history of bipolar disorder and and multiple acute psych admission.  Risk to Self: Is patient at risk for suicide?: Yes Risk to Others:   Prior Inpatient Therapy:   Prior Outpatient Therapy:    Past Medical History:  Past Medical History  Diagnosis Date  . GERD (gastroesophageal reflux disease)   . Mood disorder (HCC)   . Depression   . Difficulty swallowing   . Hypertension   . H/O suicide attempt     attempted to hang self  . DVT (deep venous thrombosis) (HCC)   . Atrial fibrillation [facility])     Past Surgical History  Procedure Laterality Date  . Hip surgery    . Arm surgery     . Abdominal hysterectomy     Family History:  Family History  Problem Relation Age of Onset  . Heart attack Mother    Family Psychiatric  History: unknown Social History:  History  Alcohol Use No     History  Drug Use No    Social History  Social History  . Marital Status: Divorced    Spouse Name: N/A  . Number of Children: N/A  . Years of Education: N/A   Social History Main Topics  . Smoking status: Never Smoker   . Smokeless tobacco: None  . Alcohol Use: No  . Drug Use: No  . Sexual Activity: No   Other Topics Concern  . None   Social History Narrative   Lives in a group home       Annice PihJackie, case coordinator, [phone]   Tiffany, aid at home      [facility]   Havery MorosCalled Jackie yesterday to say her lights were not on (no $ for light bill) and told Annice PihJackie she was going somewhere but would not say where. Called Annice PihJackie today to say she was in the hospital. Per Annice PihJackie, "exaggerates and lies"      Hx of Dementia      Tiffany cooks for her   Needs Help w/ shower/bathroom   No drive - walker and bus    HCPA herself, no living will   Additional Social History:     Allergies:   Allergies  Allergen Reactions  . Dalmane [Flurazepam Hcl] Rash  . Penicillins Rash    Has patient had a PCN reaction causing immediate rash, facial/tongue/throat swelling, SOB or lightheadedness with hypotension: Yes Has patient had a PCN reaction causing severe rash involving mucus membranes or skin necrosis: Yes Has patient had a PCN reaction that required hospitalization Has patient had a PCN reaction occurring within the last 10 years:  If all of the above answers are "NO", then may proceed with Cephalosporin use.   . Sulfa Antibiotics Rash    Labs:  No results found for this or any previous visit (from the past 48 hour(s)).  Current Facility-Administered Medications  Medication Dose Route Frequency Provider Last Rate Last Dose  .  stroke: mapping our early stages of recovery book   Does not apply Once Hollice EspySendil K Krishnan, MD      . acetaminophen (TYLENOL) tablet 650 mg  650 mg Oral Q6H PRN Hollice EspySendil K Krishnan, MD   650 mg at 01/14/15 0555   Or  . acetaminophen (TYLENOL) suppository 650 mg  650 mg Rectal Q6H PRN Hollice EspySendil K Krishnan, MD      . carvedilol (COREG) tablet 3.125 mg  3.125 mg Oral BID WC Hollice EspySendil K Krishnan, MD   3.125 mg at 01/14/15 0758  . feeding supplement (ENSURE ENLIVE) (ENSURE ENLIVE) liquid 237 mL  237 mL Oral TID BM Hollice EspySendil K Krishnan, MD   237 mL at 01/14/15 1438  . ondansetron (ZOFRAN) tablet 4 mg  4 mg Oral Q6H PRN Hollice EspySendil K Krishnan, MD       Or  .  ondansetron (ZOFRAN) injection 4 mg  4 mg Intravenous Q6H PRN Hollice EspySendil K Krishnan, MD      . polyethylene glycol (MIRALAX / GLYCOLAX) packet 17 g  17 g Oral Daily Hollice EspySendil K Krishnan, MD   17 g at 01/10/15 0951  . QUEtiapine (SEROQUEL) tablet 100 mg  100 mg Oral BID Leata MouseJanardhana Raylee Adamec, MD   100 mg at 01/14/15 1027  . Rivaroxaban (XARELTO) tablet 15 mg  15 mg Oral Q supper Hollice EspySendil K Krishnan, MD   15 mg at 01/13/15 1734    Musculoskeletal: Strength & Muscle Tone: decreased Gait & Station: unable to stand Patient leans: N/A  Psychiatric Specialty Exam: ROS:  Blood pressure 103/42, pulse 77, temperature 97.7 F (  36.5 C), temperature source Oral, resp. rate 16, height 5' (1.524 m), weight 48 kg (105 lb 13.1 oz), SpO2 99 %.Body mass index is 20.67 kg/(m^2).  General Appearance: Guarded  Eye Contact::  Good  Speech:  Slow  Volume:  Decreased  Mood:  Anxious and Depressed  Affect:  Constricted and Depressed  Thought Process:  Coherent and Goal Directed  Orientation:  Full (Time, Place, and Person)  Thought Content:  Rumination  Suicidal Thoughts:  Yes.  without intent/plan, continue to endorse suicide thoughts  Homicidal Thoughts:  No  Memory:  Immediate;   Fair Recent;   Fair  Judgement:  Fair  Insight:  Fair  Psychomotor Activity:  Decreased  Concentration:  Fair  Recall:  [employer] of Knowledge:Fair  Language: Good  Akathisia:  Negative  Handed:  Right  AIMS (if indicated):     Assets:  Communication Skills Desire for Improvement Leisure Time Resilience  ADL's:  Impaired  Cognition: Impaired,  Mild  Sleep:      Treatment Plan Summary: Daily contact with patient to assess and evaluate symptoms and progress in treatment and Medication management  Disposition: Patent attorney as patient endorses suicide ideation Continue Seroquel 100 mg twice daily and monitor for extrapyramidal symptoms and drowsiness. Follow-up with Psych LCSW regarding geriatric psychiatry  placement Recommend psychiatric Inpatient admission when medically cleared. Supportive therapy provided about ongoing stressors.  Appreciate psychiatric consultation and follow up as clinically required Please contact [phone] or [phone] if needs further assistance  Dewel Lotter,JANARDHAHA R. 01/14/2015 4:05 PM

## 2015-01-14 NOTE — Progress Notes (Signed)
Date: January 14, 2015 Chart reviewed for concurrent status and case management needs. Will continue to follow patient for changes and needs: Yoshiharu Brassell, RN, BSN, CCM   [phone] 

## 2015-01-14 NOTE — Progress Notes (Signed)
Physical Therapy Treatment Patient Details Name: Shannon Hurst MRN: [id number] DOB: 1940/03/26 Today's Date: 01/14/2015    History of Present Illness 74 y.o. Female admitted with generalized weakness, and suicidal ideation. Pt found to have moderate UTI. Pt recently was hospitalized in [place] for suicide attempt, and given train ticket back to [place] (per her report). PMH includes: Bipolar disorder with SI, DVT, Chronic diastolic HF    PT Comments    Pt. Eager to get up and walking today; ambulated ~260 ft with rollator and min guard for safety; still presenting with expressive aphasia and easily distracted; returned pt. Back to bed with sitter present   Follow Up Recommendations  SNF;Supervision/Assistance - 24 hour     Equipment Recommendations  None recommended by PT    Recommendations for Other Services       Precautions / Restrictions Precautions Precautions: Fall Restrictions Weight Bearing Restrictions: No    Mobility  Bed Mobility Overal bed mobility: Needs Assistance Bed Mobility: Supine to Sit;Sit to Supine     Supine to sit: Min guard;Min assist Sit to supine: Min assist;Mod assist (pt. states she requires assist to get LE onto bed and to scoot up in bed )   General bed mobility comments: pt. states she needs assist with supine -> sit but just needed Min guard for guidance; did need mod assist to scoot up in bed   Transfers Overall transfer level: Needs assistance Equipment used: 4-wheeled walker Transfers: Sit to/from Stand Sit to Stand: Min assist         General transfer comment: with initial transfers needs min guard - min assist however towards end of treatment just min guard for transfers   Ambulation/Gait Ambulation/Gait assistance: Supervision;Min guard Ambulation Distance (Feet): 260 Feet Assistive device: 4-wheeled walker Gait Pattern/deviations: Step-through pattern;Trunk flexed         Stairs            Wheelchair  Mobility    Modified Rankin (Stroke Patients Only)       Balance                                    Cognition Arousal/Alertness: Awake/alert Behavior During Therapy: WFL for tasks assessed/performed Overall Cognitive Status: No family/caregiver present to determine baseline cognitive functioning                 General Comments: She follows one step commands consistently; expressive aphasia noticeable     Exercises      General Comments        Pertinent Vitals/Pain Pain Assessment: No/denies pain    Home Living                      Prior Function            PT Goals (current goals can now be found in the care plan section) Acute Rehab PT Goals Patient Stated Goal: did not state Time For Goal Achievement: 01/22/15 Potential to Achieve Goals: Fair Progress towards PT goals: Progressing toward goals    Frequency  Min 3X/week    PT Plan Current plan remains appropriate    Co-evaluation             End of Session Equipment Utilized During Treatment: Gait belt Activity Tolerance: Patient tolerated treatment well;No increased pain Patient left: in bed;with call bell/phone within reach;with nursing/sitter in room     Time:  -  Charges:                       G CodesMarin Comment 01/14/2015, 3:43 PM  Reviewed and agree with above Felecia Shelling  PTA [facility]  Acute  Rehab Pager      [phone]

## 2015-01-14 NOTE — Progress Notes (Signed)
Occupational Therapy Treatment Patient Details Name: Shannon Hurst MRN: [id number] DOB: [birth year] Today's Date: 01/14/2015    History of present illness 74 y.o. Female admitted with generalized weakness, and suicidal ideation. Pt found to have moderate UTI. Pt recently was hospitalized in [place] for suicide attempt, and given train ticket back to [place] (per her report). PMH includes: Bipolar disorder with SI, DVT, Chronic diastolic HF   OT comments  Pt with good participation this day  Follow Up Recommendations  SNF;Supervision/Assistance - 24 hour    Equipment Recommendations  3 in 1 bedside comode;Wheelchair (measurements OT)    Recommendations for Other Services      Precautions / Restrictions Precautions Precautions: Fall       Mobility Bed Mobility Overal bed mobility: Modified Independent Bed Mobility: Supine to Sit;Sit to Supine     Supine to sit: Modified independent (Device/Increase time) Sit to supine: Modified independent (Device/Increase time)      Transfers Overall transfer level: Needs assistance Equipment used: 4-wheeled walker Transfers: Sit to/from [employer] CorporationStand;Stand Pivot Transfers Sit to Stand: Min assist Stand pivot transfers: Min assist            Balance                                   ADL       Grooming: Wash/dry hands;Wash/dry face;Oral care;Sitting;Set up                   Toilet Transfer: Minimal assistance;BSC;Cueing for sequencing;Cueing for safety;Stand-pivot;RW   Toileting- Clothing Manipulation and Hygiene: Minimal assistance;Sit to/from stand;Cueing for sequencing;Cueing for safety         General ADL Comments: pt wanted OOB as it was wet but she hadnt told her sitter. explained importance of not sitting in a wet bed      Vision                     Perception     Praxis      Cognition   Behavior During Therapy: [facility] Surgery CenterWFL for tasks assessed/performed Overall Cognitive Status: No  family/caregiver present to determine baseline cognitive functioning                       Extremity/Trunk Assessment               Exercises     Shoulder Instructions       General Comments      Pertinent Vitals/ Pain       Pain Assessment: No/denies pain  Home Living                                          Prior Functioning/Environment              Frequency Min 2X/week     Progress Toward Goals  OT Goals(current goals can now be found in the care plan section)  Progress towards OT goals: Progressing toward goals     Plan Discharge plan remains appropriate    Co-evaluation                 End of Session Equipment Utilized During Treatment: Rolling walker   Activity Tolerance Patient tolerated treatment well   Patient Left in bed;with call bell/phone within reach;with nursing/sitter in  room   Nurse Communication Mobility status        Time: 1610-9604 OT Time Calculation (min): 23 min  Charges: OT General Charges $OT Visit: 1 Procedure OT Treatments $Self Care/Home Management : 23-37 mins  Shannon Hurst, Metro Kung 01/14/2015, 10:16 AM

## 2015-01-15 DIAGNOSIS — I10 Essential (primary) hypertension: Secondary | ICD-10-CM | POA: Diagnosis not present

## 2015-01-15 DIAGNOSIS — R531 Weakness: Secondary | ICD-10-CM | POA: Diagnosis not present

## 2015-01-15 DIAGNOSIS — F319 Bipolar disorder, unspecified: Secondary | ICD-10-CM | POA: Diagnosis not present

## 2015-01-15 DIAGNOSIS — N390 Urinary tract infection, site not specified: Principal | ICD-10-CM

## 2015-01-15 DIAGNOSIS — I480 Paroxysmal atrial fibrillation: Secondary | ICD-10-CM | POA: Diagnosis not present

## 2015-01-15 MED ORDER — TRAZODONE HCL 50 MG PO TABS
50.0000 mg | ORAL_TABLET | Freq: Every day | ORAL | Status: DC
  Administered 2015-01-15 – 2015-01-16 (×2): 50 mg via ORAL
  Filled 2015-01-15 (×3): qty 1

## 2015-01-15 NOTE — NC FL2 (Signed)
[place] MEDICAID FL2 LEVEL OF CARE SCREENING TOOL     IDENTIFICATION  Patient Name: Shannon Hurst Birthdate: 10/14/1940 Sex: female Admission Date (Current Location): 01/07/2015  County and Medicaid Number:  ([place]) [id number] Facility and Address:         Provider Number: [id number]  Attending Physician Name and Address:  Vijaya Akula, MD  Relative Name and Phone Number:       Current Level of Care: Hospital Recommended Level of Care: Skilled Nursing Facility Prior Approval Number:    Date Approved/Denied:   PASRR Number: waiting on level 2 determination  Discharge Plan: SNF    Current Diagnoses: Patient Active Problem List   Diagnosis Date Noted  . Pressure ulcer 01/09/2015  . UTI (urinary tract infection) 01/08/2015  . UTI (lower urinary tract infection) 01/07/2015  . Chronic diastolic heart failure (HCC) 01/07/2015  . Feeling suicidal 01/07/2015  . Lower extremity weakness 01/07/2015  . Suicidal ideation 01/07/2015  . Atrial fibrillation with rapid ventricular response (HCC) 10/02/2014  . Bipolar disorder (HCC) 10/02/2014  . HTN (hypertension) 10/02/2014  . Scab 10/02/2014  . (HFpEF) heart failure with preserved ejection fraction (HCC) 10/02/2014  . Hypertension 10/02/2014  . DVT (deep venous thrombosis) (HCC) 10/02/2014  . A-fib (HCC) 10/02/2014  . GERD (gastroesophageal reflux disease)   . Mood disorder (HCC)     Orientation ACTIVITIES/SOCIAL BLADDER RESPIRATION    Self, Time, Situation, Place    Continent Normal  BEHAVIORAL SYMPTOMS/MOOD NEUROLOGICAL BOWEL NUTRITION STATUS      Continent    PHYSICIAN VISITS COMMUNICATION OF NEEDS Height & Weight Skin    Verbally   105 lbs. Normal          AMBULATORY STATUS RESPIRATION      Normal      Personal Care Assistance Level of Assistance  Dressing, Bathing Bathing Assistance: Limited assistance   Dressing Assistance: Limited assistance      Functional Limitations Info                 SPECIAL CARE FACTORS FREQUENCY                      Additional Factors Info                  Current Medications (01/15/2015): Current Facility-Administered Medications  Medication Dose Route Frequency Provider Last Rate Last Dose  .  stroke: mapping our early stages of recovery book   Does not apply Once Sendil K Krishnan, MD      . acetaminophen (TYLENOL) tablet 650 mg  650 mg Oral Q6H PRN Sendil K Krishnan, MD   650 mg at 01/14/15 0555   Or  . acetaminophen (TYLENOL) suppository 650 mg  650 mg Rectal Q6H PRN Sendil K Krishnan, MD      . carvedilol (COREG) tablet 3.125 mg  3.125 mg Oral BID WC Sendil K Krishnan, MD   3.125 mg at 01/15/15 0808  . feeding supplement (ENSURE ENLIVE) (ENSURE ENLIVE) liquid 237 mL  237 mL Oral TID BM Sendil K Krishnan, MD   237 mL at 01/15/15 1453  . ondansetron (ZOFRAN) tablet 4 mg  4 mg Oral Q6H PRN Sendil K Krishnan, MD       Or  . ondansetron (ZOFRAN) injection 4 mg  4 mg Intravenous Q6H PRN Sendil K Krishnan, MD      . polyethylene glycol (MIRALAX / GLYCOLAX) packet 17 g  17 g Oral Daily Sendil K Krishnan, MD     17 g at 01/10/15 0951  . QUEtiapine (SEROQUEL) tablet 100 mg  100 mg Oral BID Leata MouseJanardhana Jonnalagadda, MD   100 mg at 01/15/15 1051  . Rivaroxaban (XARELTO) tablet 15 mg  15 mg Oral Q supper Hollice EspySendil K Krishnan, MD   15 mg at 01/14/15 1759  . traZODone (DESYREL) tablet 50 mg  50 mg Oral QHS Leata MouseJanardhana Jonnalagadda, MD       Do not use this list as official medication orders. Please verify with discharge summary.  Discharge Medications:   Medication List    ASK your doctor about these medications        acetaminophen 160 MG/5ML suspension  Commonly known as:  TYLENOL  Take 480 mg by mouth 2 (two) times daily as needed. For pain.     ARIPiprazole 10 MG tablet  Commonly known as:  ABILIFY  Take 1 tablet (10 mg total) by mouth daily.     diltiazem 30 MG tablet  Commonly known as:  CARDIZEM  Take 1 tablet (30 mg  total) by mouth 2 (two) times daily.     ENSURE PLUS Liqd  Take 237 mLs by mouth 3 (three) times daily between meals.     QUEtiapine 100 MG tablet  Commonly known as:  SEROQUEL  Take 100 mg by mouth daily.     Rivaroxaban 15 MG Tabs tablet  Commonly known as:  XARELTO  Take 1 tablet (15 mg total) by mouth daily with supper.        Relevant Imaging Results:  Relevant Lab Results:  Recent Labs    Additional Information    Annetta MawKujawa,Kennidi Yoshida G, LCSW

## 2015-01-15 NOTE — Clinical Social Work Note (Signed)
CSW met with pt at bedside to discuss discharge plans.  CSW explained that pt had been cleared by psych and that she was ready for discharge.  Pt stated that she had seen psych and she thought she had been told that she was not going home today.  CSW provided information to pt letting her know that her psychiatrist had called and let CSW know that she was cleared and ready to be discharged.  Pt stated that she wanted to be "set up somewhere" and CSW confirmed that she meant rehab facility.  CSW was provided permission to send pt information out to acquire her a rehab bed.  CSW called [facility] and secured a bed for pt today, sent in information to [facility] and received a second notification letter requiring futher review of pt.  CSW called and spoke with pt's MD to let her know that pt required a specific form with her signature and would be leaving it in pt shadow chart for her signature.  CSW called and let [facility] know that [facility] would be delayed and the facility stated that they would be ready for pt anytime over the weekend if the [place] came in.  [facility] stated that pt's insurance medicare B and medicaid would not be a problem for them to receive pt.  CSW will continue to meet with pt until discharge.  Dede Query, LCSW [facility] Worker - Weekend Coverage cell #: [phone]

## 2015-01-15 NOTE — Consult Note (Signed)
[facility] Face-to-Face Psychiatry Consult   Reason for Consult:  Bipolar and suicide ideation Referring Physician:  Dr. Carmell Austria Patient Identification: Shannon Hurst MRN:  [id number] Principal Diagnosis: Suicidal ideation Diagnosis:   Patient Active Problem List   Diagnosis Date Noted  . Pressure ulcer [L89.90] 01/09/2015  . UTI (urinary tract infection) [N39.0] 01/08/2015  . UTI (lower urinary tract infection) [N39.0] 01/07/2015  . Chronic diastolic heart failure (HCC) [I50.32] 01/07/2015  . Feeling suicidal [F48.9] 01/07/2015  . Lower extremity weakness [R29.898] 01/07/2015  . Suicidal ideation [R45.851] 01/07/2015  . Atrial fibrillation with rapid ventricular response (HCC) [I48.91] 10/02/2014  . Bipolar disorder (HCC) [F31.9] 10/02/2014  . HTN (hypertension) [I10] 10/02/2014  . Scab [R23.4] 10/02/2014  . (HFpEF) heart failure with preserved ejection fraction (HCC) [I50.30] 10/02/2014  . Hypertension [I10] 10/02/2014  . DVT (deep venous thrombosis) (HCC) [I82.409] 10/02/2014  . A-fib (HCC) [I48.91] 10/02/2014  . GERD (gastroesophageal reflux disease) [K21.9]   . Mood disorder (HCC) [F39]     Total Time spent with patient: 30 minutes  Subjective:   Shannon Hurst is a 75 y.o. female patient admitted with Bipolar and suicide ideation.  HPI:  Shannon Hurst is a 74 y.o. female seen and chart reviewed. Patient complained of increased nerves, depression and suicide ideations. Reportedly she has been sick emotionally and having suicide thoughts and asking to be placed in mental health hospital.She can not walk and has poor ADL's. She was hospitalized while in [place], [place] for two months and came back by bus and than came to [facility]   Interval history: Patient appeared sitting in her bed and complained of not sleeping well last night and had dream about her niece. She has spoken with Staff RN and Chaplain asking to find a place for her because they helped her some time ago.  Patient says yes to suicide ideation if some one prompt her by asking are you still have suicide thoughts. She has no associated emotions like depression, anxiety, mania or psychosis, irritability, agitation or aggression, self injurious behaviors or gestures since she was admitted to hospital. Based on my observations and staff reports and patient report today while I am rounding, she stated that "I am not suicidal and I want to walk again since came from [place] and today PT helped me to walk around and I am happy. Patient is awake, alert and oriented to time place person and believe she is at her base line.  Patient is flip flopping on her suicide ideations, so please document behaviors, gestures and emotions associated if she complaints suicide next time for making appropriate recommendations.   Based on my examination and clinical observation it is determined that she has no active suicide ideations and will discontinue safety sitter and ask primary team and social service department looking for placement in community with physical rehabilitations services and psychosocial support.    Risk to Self: Is patient at risk for suicide?: Yes Risk to Others:   Prior Inpatient Therapy:   Prior Outpatient Therapy:    Past Medical History:  Past Medical History  Diagnosis Date  . GERD (gastroesophageal reflux disease)   . Mood disorder (HCC)   . Depression   . Difficulty swallowing   . Hypertension   . H/O suicide attempt     attempted to hang self  . DVT (deep venous thrombosis) (HCC)   . Atrial fibrillation [facility])     Past Surgical History  Procedure Laterality Date  . Hip surgery    .  Arm surgery     . Abdominal hysterectomy     Family History:  Family History  Problem Relation Age of Onset  . Heart attack Mother    Family Psychiatric  History: unknown Social History:  History  Alcohol Use No     History  Drug Use No    Social History   Social History  . Marital Status: Divorced     Spouse Name: N/A  . Number of Children: N/A  . Years of Education: N/A   Social History Main Topics  . Smoking status: Never Smoker   . Smokeless tobacco: None  . Alcohol Use: No  . Drug Use: No  . Sexual Activity: No   Other Topics Concern  . None   Social History Narrative   Lives in a group home      Annice Pih, case coordinator, [phone]   Tiffany, aid at home      [facility] - for her [facility]   Havery Moros yesterday to say her lights were not on (no $ for light bill) and told Annice Pih she was going somewhere but would not say where. Called Annice Pih today to say she was in the hospital. Per Annice Pih, "exaggerates and lies"      Hx of Dementia      Tiffany cooks for her   Needs Help w/ shower/bathroom   No drive - walker and bus    HCPA herself, no living will   Additional Social History:     Allergies:   Allergies  Allergen Reactions  . Dalmane [Flurazepam Hcl] Rash  . Penicillins Rash    Has patient had a PCN reaction causing immediate rash, facial/tongue/throat swelling, SOB or lightheadedness with hypotension: Yes Has patient had a PCN reaction causing severe rash involving mucus membranes or skin necrosis: Yes Has patient had a PCN reaction that required hospitalization Has patient had a PCN reaction occurring within the last 10 years:  If all of the above answers are "NO", then may proceed with Cephalosporin use.   . Sulfa Antibiotics Rash    Labs:  No results found for this or any previous visit (from the past 48 hour(s)).  Current Facility-Administered Medications  Medication Dose Route Frequency Provider Last Rate Last Dose  .  stroke: mapping our early stages of recovery book   Does not apply Once Hollice Espy, MD      . acetaminophen (TYLENOL) tablet 650 mg  650 mg Oral Q6H PRN Hollice Espy, MD   650 mg at 01/14/15 0555   Or  . acetaminophen (TYLENOL) suppository 650 mg  650 mg Rectal Q6H PRN Hollice Espy, MD      . carvedilol  (COREG) tablet 3.125 mg  3.125 mg Oral BID WC Hollice Espy, MD   3.125 mg at 01/15/15 0808  . feeding supplement (ENSURE ENLIVE) (ENSURE ENLIVE) liquid 237 mL  237 mL Oral TID BM Hollice Espy, MD   237 mL at 01/15/15 1051  . ondansetron (ZOFRAN) tablet 4 mg  4 mg Oral Q6H PRN Hollice Espy, MD       Or  . ondansetron (ZOFRAN) injection 4 mg  4 mg Intravenous Q6H PRN Hollice Espy, MD      . polyethylene glycol (MIRALAX / GLYCOLAX) packet 17 g  17 g Oral Daily Hollice Espy, MD   17 g at 01/10/15 0951  . QUEtiapine (SEROQUEL) tablet 100 mg  100 mg Oral BID Leata Mouse, MD  100 mg at 01/15/15 1051  . Rivaroxaban (XARELTO) tablet 15 mg  15 mg Oral Q supper Hollice EspySendil K Krishnan, MD   15 mg at 01/14/15 1759    Musculoskeletal: Strength & Muscle Tone: decreased Gait & Station: unable to stand Patient leans: N/A  Psychiatric Specialty Exam: ROS:  Blood pressure 126/54, pulse 64, temperature 97.6 F (36.4 C), temperature source Oral, resp. rate 16, height 5' (1.524 m), weight 48 kg (105 lb 13.1 oz), SpO2 98 %.Body mass index is 20.67 kg/(m^2).  General Appearance: Guarded  Eye Contact::  Good  Speech:  Slow  Volume:  Normal  Mood:  Depressed  Affect:  Appropriate and Congruent  Thought Process:  Coherent and Goal Directed  Orientation:  Full (Time, Place, and Person)  Thought Content:  WDL  Suicidal Thoughts:  No  Homicidal Thoughts:  No  Memory:  Immediate;   Fair Recent;   Fair  Judgement:  Fair  Insight:  Fair  Psychomotor Activity:  Decreased  Concentration:  Fair  Recall:  FiservFair  Fund of Knowledge:Fair  Language: Good  Akathisia:  Negative  Handed:  Right  AIMS (if indicated):     Assets:  Communication Skills Desire for Improvement Leisure Time Resilience  ADL's:  Impaired  Cognition: WNL  Sleep:      Treatment Plan Summary: Daily contact with patient to assess and evaluate symptoms and progress in treatment and Medication  management  Disposition: Network engineerDiscontinue safety sitter as patient denies suicide ideation and has no associated emotions or behaviors Will start Trazodone 50 mg PO Qhs for insomnia Continue Seroquel 100 mg BID for mood swings and monitor for extrapyramidal symptoms   Patient does not meet criteria for psychiatric inpatient admission. Supportive therapy provided about ongoing stressors.  Appreciate psychiatric consultation and will sign off at this time Please contact [phone] or [phone] if needs further assistance  Saffron Busey,JANARDHAHA R. 01/15/2015 2:06 PM

## 2015-01-15 NOTE — Progress Notes (Signed)
   01/15/15 1000  Clinical Encounter Type  Visited With Patient;Other (Comment) Psychiatrist(sitter)  Visit Type Follow-up;Social support;Spiritual support;Behavioral Health  Referral From Patient;Nurse  Consult/Referral To Social work  Stress Factors  Patient Stress Factors Lack of caregivers;Financial concerns;Other (Comment)   Chaplain follow up with pt at her request.  Pt wished chaplain to contact Homero FellersFrank and Yancey FlemingsHeather McGlaughlan, chaplains at [facility].  This chaplain had previously called and left message with [facility] at [name] request.  Informed chaplaincy at [facility] that pt is in hospital.  Pt stated she wished to speak with Homero FellersFrank, Biochemist, clinicaldirector of chaplaincy at [facility].  This chaplain will contact Homero FellersFrank and determine whether they can provide support with pt while she is hospitalized.  Pt agreed to this.  She was not specific [facility].    She states that [facility] previously assisted her in finding a place to live.  Stated she worked with Child psychotherapistsocial worker named "Harlon FlorJackie Johnson," but that this person was not with [facility].    Chaplain provided bible at pt request.     Belva CromeStalnaker, Sabrea Sankey Wayne MDiv

## 2015-01-15 NOTE — Discharge Summary (Signed)
Physician Discharge Summary  Shannon Hurst XBJ:[id number] DOB: 05/09/1940 DOA: 01/07/2015  PCP: Antonietta Jewel, MD  Admit date: 01/07/2015 Discharge date: 01/16/2015  Time spent: 25 minutes  Recommendations for Outpatient Follow-up:  1. Follow upw ith psychiatry as outpatient as needed.  2. Please follow up with PCP as needed.   Discharge Diagnoses:  Principal Problem:   Suicidal ideation Active Problems:   Bipolar disorder (Shannon Hurst)   HTN (hypertension)   DVT (deep venous thrombosis) (HCC)   A-fib (HCC)   UTI (lower urinary tract infection)   Chronic diastolic heart failure (HCC)   Feeling suicidal   Lower extremity weakness   UTI (urinary tract infection)   Pressure ulcer   Discharge Condition: improved  Diet recommendation: regular  Filed Weights   01/08/15 0930 01/11/15 1200  Weight: 47.3 kg (104 lb 4.4 oz) 48 kg (105 lb 13.1 oz)    History of present illness:  Shannon Hurst is a 74 y.o. female  With past history of depression and DVT on Xarelto who was hospitalized in [place] for what reportedly was a suicide attempt. Patient was felt to be stable, discharged given a train ticket back to her home in [place] and then told to go to an ER there. (While she was in [place], patient lost her apartment ) Upon arrival in the emergency room on , 10/20, patient was complaining of some overall weakness and she was found to have a moderate UTI with a mild leukocytosis. She reported active suicidal ideation and hospitalists were called for further evaluation.  MRI done noted no evidence at all of CVA. Patient's lower extremity weakness looks to be more of deconditioning & patient subjection. Seen by psychiatry who started her on Abilify who feel that patient needs active inpatient psychiatric management. Patient since responded to IV rocephin & with normal WBC now, medically stable. She's been changed over to by mouth antibiotics, completed teh course of  antibiotics. No suicidal ideations today. No complaints today.  Hospital Course:  UTI;  Presents with leukocytosis, UA with too numerous to count WBC.  Pan sensitive Proteus Changed to po Cipro-course will be completed on 10/26.   Hypotension;  Probably from dehydration and poor po intake. Resolved. . Lactic acid normal.   Hyperbilirubinemia, elevated Alk phosphatase:  Likely in the setting of acute illness from infection. Resolved.    HTN;  Well controlled.   Bioplar, depression, suicidal thought. Psych consulted.  Sitter at bedside.  Plan for inpatient hospitalization. But today she denies any suicidal ideations and no mood changes or agitation.  Sitter discontinued.    DVT; Continue with xarelto.   A-fib (HCC) paroxysmal: On Xarelto. On Coreg cardizem on hold for hypotension on admission, plan to resume it on discharge.   Lower extremity weakness:  Physical therapy eval. Will eventually need a SNF.  Stage 1 sacral decubitus ulcer-Present on admission. Encouraging out of bed with PT  Procedures:  none  Consultations:  Psychiatry.   Discharge Exam: Filed Vitals:   01/15/15 1415  BP: 104/56  Pulse: 82  Temp: 97.5 F (36.4 C)  Resp: 18    General: alert afebrile comfortable Cardiovascular: s1s2 Respiratory: ctab  Discharge Instructions    Current Discharge Medication List    CONTINUE these medications which have NOT CHANGED   Details  acetaminophen (TYLENOL) 160 MG/5ML suspension Take 480 mg by mouth 2 (two) times daily as needed. For pain.    ARIPiprazole (ABILIFY) 10 MG tablet Take 1 tablet (10 mg total) by mouth  daily. Qty: 30 tablet, Refills: 1    diltiazem (CARDIZEM) 30 MG tablet Take 1 tablet (30 mg total) by mouth 2 (two) times daily. Qty: 60 tablet, Refills: 3    Ensure Plus (ENSURE PLUS) LIQD Take 237 mLs by mouth 3 (three) times daily between meals.    QUEtiapine (SEROQUEL) 100 MG tablet Take 100 mg by mouth daily.     Rivaroxaban (XARELTO) 15 MG TABS tablet Take 1 tablet (15 mg total) by mouth daily with supper. Qty: 30 tablet, Refills: 3       Allergies  Allergen Reactions  . Dalmane [Flurazepam Hcl] Rash  . Penicillins Rash    Has patient had a PCN reaction causing immediate rash, facial/tongue/throat swelling, SOB or lightheadedness with hypotension: Yes Has patient had a PCN reaction causing severe rash involving mucus membranes or skin necrosis: Yes Has patient had a PCN reaction that required hospitalization Has patient had a PCN reaction occurring within the last 10 years:  If all of the above answers are "NO", then may proceed with Cephalosporin use.   . Sulfa Antibiotics Rash      The results of significant diagnostics from this hospitalization (including imaging, microbiology, ancillary and laboratory) are listed below for reference.    Significant Diagnostic Studies: Dg Chest 2 View  01/07/2015  CLINICAL DATA:  Shortness of breath.  Evaluate for pneumonia EXAM: CHEST  2 VIEW COMPARISON:  10/01/2014 FINDINGS: Large hiatal hernia with similar appearance to prior. Hyperinflation, chronic. There is no edema, consolidation, effusion, or pneumothorax. Normal heart size and stable aortic tortuosity. IMPRESSION: No acute finding. Large hiatal hernia. Electronically Signed   By: Monte Fantasia M.D.   On: 01/07/2015 11:23   Mr Brain Wo Contrast  01/08/2015  CLINICAL DATA:  CVA questioned due to confusion. Generalized weakness. EXAM: MRI HEAD WITHOUT CONTRAST TECHNIQUE: Multiplanar, multiecho pulse sequences of the brain and surrounding structures were obtained without intravenous contrast. COMPARISON:  Head CT 03/03/2010 FINDINGS: Motion degraded but overall diagnostic. Calvarium and upper cervical spine: No focal marrow signal abnormality. Orbits: No significant findings. Sinuses and Mastoids: Mucosal thickening within right concha bullosa. No acute finding. Brain: No acute infarct, hemorrhage,  hydrocephalus, or mass lesion. No evidence of large vessel occlusion. No unusual/focal atrophy or ischemic change for age. No ventriculomegaly or extra-axial fluid collection. Borderline enlargement of the pituitary gland without heterogeneity to suggest mass. IMPRESSION: Normal brain MRI for age. Electronically Signed   By: Monte Fantasia M.D.   On: 01/08/2015 07:15    Microbiology: Recent Results (from the past 240 hour(s))  Urine culture     Status: None   Collection Time: 01/07/15 10:32 AM  Result Value Ref Range Status   Specimen Description URINE, CATHETERIZED  Final   Special Requests NONE  Final   Culture   Final    >=100,000 COLONIES/mL PROTEUS MIRABILIS Performed at [facility]    Report Status 01/09/2015 FINAL  Final   Organism ID, Bacteria PROTEUS MIRABILIS  Final      Susceptibility   Proteus mirabilis - MIC*    AMPICILLIN <=2 SENSITIVE Sensitive     CEFAZOLIN <=4 SENSITIVE Sensitive     CEFTRIAXONE <=1 SENSITIVE Sensitive     CIPROFLOXACIN <=0.25 SENSITIVE Sensitive     GENTAMICIN <=1 SENSITIVE Sensitive     IMIPENEM 4 SENSITIVE Sensitive     NITROFURANTOIN 128 RESISTANT Resistant     TRIMETH/SULFA <=20 SENSITIVE Sensitive     AMPICILLIN/SULBACTAM <=2 SENSITIVE Sensitive     PIP/TAZO <=  4 SENSITIVE Sensitive     * >=100,000 COLONIES/mL PROTEUS MIRABILIS     Labs: Basic Metabolic Panel:  Recent Labs Lab 01/09/15 0600  NA 139  K 3.6  CL 106  CO2 25  GLUCOSE 83  BUN 10  CREATININE 0.42*  CALCIUM 9.2   Liver Function Tests:  Recent Labs Lab 01/09/15 0600  AST 32  ALT 20  ALKPHOS 106  BILITOT 0.3  PROT 6.0*  ALBUMIN 3.0*   No results for input(s): LIPASE, AMYLASE in the last 168 hours. No results for input(s): AMMONIA in the last 168 hours. CBC:  Recent Labs Lab 01/09/15 0600  WBC 7.8  HGB 10.1*  HCT 33.3*  MCV 78.4  PLT 364   Cardiac Enzymes: No results for input(s): CKTOTAL, CKMB, CKMBINDEX, TROPONINI in the last 168  hours. BNP: BNP (last 3 results)  Recent Labs  10/01/14 2100 10/02/14 0905 01/07/15 1018  BNP 46.8 52.3 36.4    ProBNP (last 3 results) No results for input(s): PROBNP in the last 8760 hours.  CBG: No results for input(s): GLUCAP in the last 168 hours.     SignedHosie Poisson  [facility] 01/15/2015, 6:36 PM

## 2015-01-15 NOTE — Progress Notes (Addendum)
Disposition remains:  Inpatient Acute Psychiatric Facility  Referrals have been sent to the following and are under review:  01/15/15: [facility]  Denied due to medical acuity [place]   [facility]  Resending referral again per request Bennie HindRowan Davis  [facility] ( was declined as patient was not medically cleared the first time when referrals were sent)    [facility]: [facility]   Will make follow up calls regarding clinicals and if accepted.  Deretha EmoryHannah Ambar Raphael LCSW, MSW Clinical Social Work: Emergency Room [phone]   (assisting with [facility])

## 2015-01-15 NOTE — Progress Notes (Signed)
Reviewing plan of care with patient and patient stated she still wanted to harm herself by hanging.  No other actions, behaviors, or words expressed by patient relating mood instability or self harm.

## 2015-01-15 NOTE — [place] FL2 (Signed)
[place] MEDICAID FL2 LEVEL OF CARE SCREENING TOOL     IDENTIFICATION  Patient Name: Shannon Hurst Birthdate: [birth year] Sex: female Admission Date (Current Location): 01/07/2015  [facility] and [place] Number:  ([place]) [id number] S Facility and Address:         Provider Number: [id number]  Attending Physician Name and Address:  Kathlen Mody, MD  Relative Name and Phone Number:       Current Level of Care: Hospital Recommended Level of Care: Skilled Nursing Facility Prior Approval Number:    Date Approved/Denied:   PASRR Number: waiting on level 2 determination  Discharge Plan: SNF    Current Diagnoses: Patient Active Problem List   Diagnosis Date Noted  . Pressure ulcer 01/09/2015  . UTI (urinary tract infection) 01/08/2015  . UTI (lower urinary tract infection) 01/07/2015  . Chronic diastolic heart failure (HCC) 01/07/2015  . Feeling suicidal 01/07/2015  . Lower extremity weakness 01/07/2015  . Suicidal ideation 01/07/2015  . Atrial fibrillation with rapid ventricular response (HCC) 10/02/2014  . Bipolar disorder (HCC) 10/02/2014  . HTN (hypertension) 10/02/2014  . Scab 10/02/2014  . (HFpEF) heart failure with preserved ejection fraction (HCC) 10/02/2014  . Hypertension 10/02/2014  . DVT (deep venous thrombosis) (HCC) 10/02/2014  . A-fib (HCC) 10/02/2014  . GERD (gastroesophageal reflux disease)   . Mood disorder (HCC)     Orientation ACTIVITIES/SOCIAL BLADDER RESPIRATION    Self, Time, Situation, Place    Continent Normal  BEHAVIORAL SYMPTOMS/MOOD NEUROLOGICAL BOWEL NUTRITION STATUS      Continent    PHYSICIAN VISITS COMMUNICATION OF NEEDS Height & Weight Skin    Verbally   105 lbs. Normal          AMBULATORY STATUS RESPIRATION      Normal      Personal Care Assistance Level of Assistance  Dressing, Bathing Bathing Assistance: Limited assistance   Dressing Assistance: Limited assistance      Functional Limitations Info                 SPECIAL CARE FACTORS FREQUENCY                      Additional Factors Info                  Current Medications (01/15/2015): Current Facility-Administered Medications  Medication Dose Route Frequency Provider Last Rate Last Dose  .  stroke: mapping our early stages of recovery book   Does not apply Once Hollice Espy, MD      . acetaminophen (TYLENOL) tablet 650 mg  650 mg Oral Q6H PRN Hollice Espy, MD   650 mg at 01/14/15 0555   Or  . acetaminophen (TYLENOL) suppository 650 mg  650 mg Rectal Q6H PRN Hollice Espy, MD      . carvedilol (COREG) tablet 3.125 mg  3.125 mg Oral BID WC Hollice Espy, MD   3.125 mg at 01/15/15 0808  . feeding supplement (ENSURE ENLIVE) (ENSURE ENLIVE) liquid 237 mL  237 mL Oral TID BM Hollice Espy, MD   237 mL at 01/15/15 1453  . ondansetron (ZOFRAN) tablet 4 mg  4 mg Oral Q6H PRN Hollice Espy, MD       Or  . ondansetron (ZOFRAN) injection 4 mg  4 mg Intravenous Q6H PRN Hollice Espy, MD      . polyethylene glycol (MIRALAX / GLYCOLAX) packet 17 g  17 g Oral Daily Hollice Espy, MD  17 g at 01/10/15 0951  . QUEtiapine (SEROQUEL) tablet 100 mg  100 mg Oral BID Leata MouseJanardhana Jonnalagadda, MD   100 mg at 01/15/15 1051  . Rivaroxaban (XARELTO) tablet 15 mg  15 mg Oral Q supper Hollice EspySendil K Krishnan, MD   15 mg at 01/14/15 1759  . traZODone (DESYREL) tablet 50 mg  50 mg Oral QHS Leata MouseJanardhana Jonnalagadda, MD       Do not use this list as official medication orders. Please verify with discharge summary.  Discharge Medications:   Medication List    ASK your doctor about these medications        acetaminophen 160 MG/5ML suspension  Commonly known as:  TYLENOL  Take 480 mg by mouth 2 (two) times daily as needed. For pain.     ARIPiprazole 10 MG tablet  Commonly known as:  ABILIFY  Take 1 tablet (10 mg total) by mouth daily.     diltiazem 30 MG tablet  Commonly known as:  CARDIZEM  Take 1 tablet (30 mg  total) by mouth 2 (two) times daily.     ENSURE PLUS Liqd  Take 237 mLs by mouth 3 (three) times daily between meals.     QUEtiapine 100 MG tablet  Commonly known as:  SEROQUEL  Take 100 mg by mouth daily.     Rivaroxaban 15 MG Tabs tablet  Commonly known as:  XARELTO  Take 1 tablet (15 mg total) by mouth daily with supper.        Relevant Imaging Results:  Relevant Lab Results:  Recent Labs    Additional Information    Annetta MawKujawa,Dima Mini G, LCSW

## 2015-01-16 DIAGNOSIS — R531 Weakness: Secondary | ICD-10-CM | POA: Diagnosis not present

## 2015-01-16 DIAGNOSIS — I10 Essential (primary) hypertension: Secondary | ICD-10-CM | POA: Diagnosis not present

## 2015-01-16 DIAGNOSIS — N390 Urinary tract infection, site not specified: Secondary | ICD-10-CM | POA: Diagnosis not present

## 2015-01-16 NOTE — Clinical Social Work Note (Signed)
CSW faxed the 30 day letter, H and P and FL2 as requested by [employer]  Must for pt to obtain [place] number.  Awaiting pasarr for SNF placement.   .Man Effertz, LCSW [facility] Clinical Social Worker - Weekend Coverage cell #: [phone]

## 2015-01-16 NOTE — Progress Notes (Signed)
Pt with low bp of 112/49.  Scheduled to receive coreg. MD made aware. Said to hold coreg. Med held. Vwilliams,rn.

## 2015-01-16 NOTE — Clinical Social Work Note (Signed)
CSW met with pt at bedside to discuss discharge plans. CSW prompted pt to discuss her discharge plans and needs and pt stated that "I want to go to rehab".   CSW provided information to pt regarding the wait for a rehab bed due to not being able to require a pasarr number.  CSW also explained that pt [place] be out of county if [facility] does not have the bed when she is ready to be discharged.  Pt stated she was ok with staying and waiting for rehab.  She stated that she had trouble standing today and also discussed needing a new walker stating that her walker is broken.  CSW will refer to case manager to assess for walker.  Pt also stated that she has no clothing with he and she only has the hospital gown she is wearing.  CSW will look into obtaining some clothing for pt to be able to leave the hospital when she is discharged.  CSW spoke with Juliann Pulse at [facility] and she came up to the hospital to meet with pt to assess needs.  [facility] state that they will have a bed for pt when the pasarr number comes in.

## 2015-01-16 NOTE — Discharge Summary (Signed)
Physician Discharge Summary  Shannon Hurst ITG:[id number] DOB: 1940/04/24 DOA: 01/07/2015  PCP: Antonietta Jewel, MD  Admit date: 01/07/2015 Discharge date: 01/16/2015  Time spent: 25 minutes  Recommendations for Outpatient Follow-up:  1. Follow upw ith psychiatry as outpatient as needed.  2. Please follow up with PCP as needed.   Discharge Diagnoses:  Principal Problem:   Suicidal ideation Active Problems:   Bipolar disorder ([place])   HTN (hypertension)   DVT (deep venous thrombosis) (HCC)   A-fib (HCC)   UTI (lower urinary tract infection)   Chronic diastolic heart failure (HCC)   Feeling suicidal   Lower extremity weakness   UTI (urinary tract infection)   Pressure ulcer   Discharge Condition: improved  Diet recommendation: regular  Filed Weights   01/08/15 0930 01/11/15 1200  Weight: 47.3 kg (104 lb 4.4 oz) 48 kg (105 lb 13.1 oz)    History of present illness:  Shannon Hurst is a 74 y.o. female  With past history of depression and DVT on Xarelto who was hospitalized in [place] for what reportedly was a suicide attempt. Patient was felt to be stable, discharged given a train ticket back to her home in [place] and then told to go to an ER there. (While she was in [place], patient lost her apartment ) Upon arrival in the emergency room on , 10/20, patient was complaining of some overall weakness and she was found to have a moderate UTI with a mild leukocytosis. She reported active suicidal ideation and hospitalists were called for further evaluation.  MRI done noted no evidence at all of CVA. Patient's lower extremity weakness looks to be more of deconditioning & patient subjection. Seen by psychiatry who started her on Abilify who feel that patient needs active inpatient psychiatric management. Patient since responded to IV rocephin & with normal WBC now, medically stable. She's been changed over to by mouth antibiotics, completed teh course of  antibiotics. No suicidal ideations today. No complaints today.  Hospital Course:  UTI;  Presents with leukocytosis, UA with too numerous to count WBC.  Pan sensitive Proteus Changed to po Cipro-course will be completed on 10/26.   Hypotension;  Probably from dehydration and poor po intake. Resolved. . Lactic acid normal.   Hyperbilirubinemia, elevated Alk phosphatase:  Likely in the setting of acute illness from infection. Resolved.    HTN;  Well controlled.   Bioplar, depression, suicidal thought. Psych consulted.  Sitter at bedside.  Plan for inpatient hospitalization. But today she denies any suicidal ideations and no mood changes or agitation.  Sitter discontinued.    DVT; Continue with xarelto.   A-fib (HCC) paroxysmal: On Xarelto. On Coreg cardizem on hold for hypotension on admission, plan to resume it on discharge.   Lower extremity weakness:  Physical therapy eval. Will eventually need a SNF.  Stage 1 sacral decubitus ulcer-Present on admission. Encouraging out of bed with PT  Procedures:  none  Consultations:  Psychiatry.   Discharge Exam: Filed Vitals:   01/16/15 0846  BP: 112/49  Pulse: 75  Temp:   Resp:     General: alert afebrile comfortable Cardiovascular: s1s2 Respiratory: ctab  Discharge Instructions    Current Discharge Medication List    CONTINUE these medications which have NOT CHANGED   Details  acetaminophen (TYLENOL) 160 MG/5ML suspension Take 480 mg by mouth 2 (two) times daily as needed. For pain.    ARIPiprazole (ABILIFY) 10 MG tablet Take 1 tablet (10 mg total) by mouth daily. Qty: 30  tablet, Refills: 1    diltiazem (CARDIZEM) 30 MG tablet Take 1 tablet (30 mg total) by mouth 2 (two) times daily. Qty: 60 tablet, Refills: 3    Ensure Plus (ENSURE PLUS) LIQD Take 237 mLs by mouth 3 (three) times daily between meals.    QUEtiapine (SEROQUEL) 100 MG tablet Take 100 mg by mouth daily.    Rivaroxaban (XARELTO)  15 MG TABS tablet Take 1 tablet (15 mg total) by mouth daily with supper. Qty: 30 tablet, Refills: 3       Allergies  Allergen Reactions  . Dalmane [Flurazepam Hcl] Rash  . Penicillins Rash    Has patient had a PCN reaction causing immediate rash, facial/tongue/throat swelling, SOB or lightheadedness with hypotension: Yes Has patient had a PCN reaction causing severe rash involving mucus membranes or skin necrosis: Yes Has patient had a PCN reaction that required hospitalization Has patient had a PCN reaction occurring within the last 10 years:  If all of the above answers are "NO", then may proceed with Cephalosporin use.   . Sulfa Antibiotics Rash      The results of significant diagnostics from this hospitalization (including imaging, microbiology, ancillary and laboratory) are listed below for reference.    Significant Diagnostic Studies: Dg Chest 2 View  01/07/2015  CLINICAL DATA:  Shortness of breath.  Evaluate for pneumonia EXAM: CHEST  2 VIEW COMPARISON:  10/01/2014 FINDINGS: Large hiatal hernia with similar appearance to prior. Hyperinflation, chronic. There is no edema, consolidation, effusion, or pneumothorax. Normal heart size and stable aortic tortuosity. IMPRESSION: No acute finding. Large hiatal hernia. Electronically Signed   By: Monte Fantasia M.D.   On: 01/07/2015 11:23   Mr Brain Wo Contrast  01/08/2015  CLINICAL DATA:  CVA questioned due to confusion. Generalized weakness. EXAM: MRI HEAD WITHOUT CONTRAST TECHNIQUE: Multiplanar, multiecho pulse sequences of the brain and surrounding structures were obtained without intravenous contrast. COMPARISON:  Head CT 03/03/2010 FINDINGS: Motion degraded but overall diagnostic. Calvarium and upper cervical spine: No focal marrow signal abnormality. Orbits: No significant findings. Sinuses and Mastoids: Mucosal thickening within right concha bullosa. No acute finding. Brain: No acute infarct, hemorrhage, hydrocephalus, or mass  lesion. No evidence of large vessel occlusion. No unusual/focal atrophy or ischemic change for age. No ventriculomegaly or extra-axial fluid collection. Borderline enlargement of the pituitary gland without heterogeneity to suggest mass. IMPRESSION: Normal brain MRI for age. Electronically Signed   By: Monte Fantasia M.D.   On: 01/08/2015 07:15    Microbiology: Recent Results (from the past 240 hour(s))  Urine culture     Status: None   Collection Time: 01/07/15 10:32 AM  Result Value Ref Range Status   Specimen Description URINE, CATHETERIZED  Final   Special Requests NONE  Final   Culture   Final    >=100,000 COLONIES/mL PROTEUS MIRABILIS Performed at [facility]    Report Status 01/09/2015 FINAL  Final   Organism ID, Bacteria PROTEUS MIRABILIS  Final      Susceptibility   Proteus mirabilis - MIC*    AMPICILLIN <=2 SENSITIVE Sensitive     CEFAZOLIN <=4 SENSITIVE Sensitive     CEFTRIAXONE <=1 SENSITIVE Sensitive     CIPROFLOXACIN <=0.25 SENSITIVE Sensitive     GENTAMICIN <=1 SENSITIVE Sensitive     IMIPENEM 4 SENSITIVE Sensitive     NITROFURANTOIN 128 RESISTANT Resistant     TRIMETH/SULFA <=20 SENSITIVE Sensitive     AMPICILLIN/SULBACTAM <=2 SENSITIVE Sensitive     PIP/TAZO <=4 SENSITIVE Sensitive     * >=  100,000 COLONIES/mL PROTEUS MIRABILIS     Labs: Basic Metabolic Panel: No results for input(s): NA, K, CL, CO2, GLUCOSE, BUN, CREATININE, CALCIUM, MG, PHOS in the last 168 hours. Liver Function Tests: No results for input(s): AST, ALT, ALKPHOS, BILITOT, PROT, ALBUMIN in the last 168 hours. No results for input(s): LIPASE, AMYLASE in the last 168 hours. No results for input(s): AMMONIA in the last 168 hours. CBC: No results for input(s): WBC, NEUTROABS, HGB, HCT, MCV, PLT in the last 168 hours. Cardiac Enzymes: No results for input(s): CKTOTAL, CKMB, CKMBINDEX, TROPONINI in the last 168 hours. BNP: BNP (last 3 results)  Recent Labs  10/01/14 2100  10/02/14 0905 01/07/15 1018  BNP 46.8 52.3 36.4    ProBNP (last 3 results) No results for input(s): PROBNP in the last 8760 hours.  CBG: No results for input(s): GLUCAP in the last 168 hours.     SignedHosie Poisson  [facility] 01/16/2015, 11:21 AM  Patient seen and examined and no change in management.  Hosie Poisson,  MD [phone]

## 2015-01-17 DIAGNOSIS — I480 Paroxysmal atrial fibrillation: Secondary | ICD-10-CM | POA: Diagnosis not present

## 2015-01-17 DIAGNOSIS — N390 Urinary tract infection, site not specified: Secondary | ICD-10-CM | POA: Diagnosis not present

## 2015-01-17 DIAGNOSIS — I5032 Chronic diastolic (congestive) heart failure: Secondary | ICD-10-CM | POA: Diagnosis not present

## 2015-01-17 DIAGNOSIS — R531 Weakness: Secondary | ICD-10-CM | POA: Diagnosis not present

## 2015-01-17 MED ORDER — TRAZODONE HCL 50 MG PO TABS: 50.0000 mg | ORAL_TABLET | Freq: Every day | ORAL | Status: AC

## 2015-01-17 NOTE — NC FL2 (Signed)
[place] MEDICAID FL2 LEVEL OF CARE SCREENING TOOL     IDENTIFICATION  Patient Name: Shannon Hurst Birthdate: 1940/04/02 Sex: female Admission Date (Current Location): 01/07/2015  [facility] [place]  ([place] [id number] S Facility and Address:         Provider Number: [id number]  Attending Physician Name and Address:  Kathlen ModyVijaya Akula, MD  Relative Name and Phone Number:       Current Level of Care: Hospital Recommended Level of Care: Skilled Nursing Facility Prior Approval Number:    Date Approved/Denied:   PASRR Number: waiting on level 2 determination  Discharge Plan: SNF    Current Diagnoses: Patient Active Problem List   Diagnosis Date Noted  . Pressure ulcer 01/09/2015  . UTI (urinary tract infection) 01/08/2015  . UTI (lower urinary tract infection) 01/07/2015  . Chronic diastolic heart failure (HCC) 01/07/2015  . Feeling suicidal 01/07/2015  . Lower extremity weakness 01/07/2015  . Suicidal ideation 01/07/2015  . Atrial fibrillation with rapid ventricular response (HCC) 10/02/2014  . Bipolar disorder (HCC) 10/02/2014  . HTN (hypertension) 10/02/2014  . Scab 10/02/2014  . (HFpEF) heart failure with preserved ejection fraction (HCC) 10/02/2014  . Hypertension 10/02/2014  . DVT (deep venous thrombosis) (HCC) 10/02/2014  . A-fib (HCC) 10/02/2014  . GERD (gastroesophageal reflux disease)   . Mood disorder (HCC)     Orientation ACTIVITIES/SOCIAL BLADDER RESPIRATION    Self, Time, Situation, Place    Continent Normal  BEHAVIORAL SYMPTOMS/MOOD NEUROLOGICAL BOWEL NUTRITION STATUS      Continent    PHYSICIAN VISITS COMMUNICATION OF NEEDS Height & Weight Skin    Verbally   105 lbs. Normal          AMBULATORY STATUS RESPIRATION      Normal      Personal Care Assistance Level of Assistance  Dressing, Bathing Bathing Assistance: Limited assistance   Dressing Assistance: Limited assistance      Functional Limitations Info                 SPECIAL CARE FACTORS FREQUENCY                      Additional Factors Info                  Current Medications (01/17/2015): Current Facility-Administered Medications  Medication Dose Route Frequency Provider Last Rate Last Dose  .  stroke: mapping our early stages of recovery book   Does not apply Once Hollice EspySendil K Krishnan, MD      . acetaminophen (TYLENOL) tablet 650 mg  650 mg Oral Q6H PRN Hollice EspySendil K Krishnan, MD   650 mg at 01/14/15 0555   Or  . acetaminophen (TYLENOL) suppository 650 mg  650 mg Rectal Q6H PRN Hollice EspySendil K Krishnan, MD      . carvedilol (COREG) tablet 3.125 mg  3.125 mg Oral BID WC Hollice EspySendil K Krishnan, MD   3.125 mg at 01/15/15 1814  . feeding supplement (ENSURE ENLIVE) (ENSURE ENLIVE) liquid 237 mL  237 mL Oral TID BM Hollice EspySendil K Krishnan, MD   237 mL at 01/16/15 2121  . ondansetron (ZOFRAN) tablet 4 mg  4 mg Oral Q6H PRN Hollice EspySendil K Krishnan, MD       Or  . ondansetron (ZOFRAN) injection 4 mg  4 mg Intravenous Q6H PRN Hollice EspySendil K Krishnan, MD      . polyethylene glycol (MIRALAX / GLYCOLAX) packet 17 g  17 g Oral Daily Hollice EspySendil K Krishnan, MD  17 g at 01/10/15 0951  . QUEtiapine (SEROQUEL) tablet 100 mg  100 mg Oral BID Janardhana Jonnalagadda, MD   100 mg at 01/16/15 2121  . Rivaroxaban (XARELTO) tablet 15 mg  15 mg Oral Q supper Sendil K Krishnan, MD   15 mg at 01/16/15 1752  . traZODone (DESYREL) tablet 50 mg  50 mg Oral QHS Janardhana Jonnalagadda, MD   50 mg at 01/16/15 2121   Do not use this list as official medication orders. Please verify with discharge summary.  Discharge Medications:   Medication List    ASK your doctor about these medications        acetaminophen 160 MG/5ML suspension  Commonly known as:  TYLENOL  Take 480 mg by mouth 2 (two) times daily as needed. For pain.     ARIPiprazole 10 MG tablet  Commonly known as:  ABILIFY  Take 1 tablet (10 mg total) by mouth daily.     diltiazem 30 MG tablet  Commonly known as:  CARDIZEM   Take 1 tablet (30 mg total) by mouth 2 (two) times daily.     ENSURE PLUS Liqd  Take 237 mLs by mouth 3 (three) times daily between meals.     QUEtiapine 100 MG tablet  Commonly known as:  SEROQUEL  Take 100 mg by mouth daily.     Rivaroxaban 15 MG Tabs tablet  Commonly known as:  XARELTO  Take 1 tablet (15 mg total) by mouth daily with supper.        Relevant Imaging Results:  Relevant Lab Results:  Recent Labs    Additional Information    Shenell Rogalski G, LCSW     

## 2015-01-17 NOTE — Clinical Social Work Note (Signed)
[facility] called [facility] and spoke with Stanton Kidneyebra regarding a SNF bed per Dr. Jacky KindleAronson.  CSW explained that Dr Deniece PortelaArononson would providing an LOG and requested to speak to the admission dept.  Per Stanton KidneyDebra there is no one on call for Sunday  CSW called [facility] to request a SNF bed.  The facility called Berton MountHarriet who was on call and she called CSW.  CSW explained that Dr. Jacky KindleAronson was providing an LOG and pt needed a bed today.  Berton MountHarriet stated that she had no way of looking at pt today but would be glad to do so tomorrow to make a decision   CSW called [facility] and spoke with [place] requesting a SNF bed for pt per Dr. Jacky KindleAronson.  Per Cleon GustinShaudra there is no one on call today for admissions call back tomorrow  CSW will continue to look for SNF beds for pt/

## 2015-01-17 NOTE — Progress Notes (Signed)
Patient discharged.  Room air.  Denies pain.  No complaints.  Leaving with all her personal belongings brought in at admission and clothes provided by Child psychotherapistsocial worker.  EMS [facility].  Vital signs stable.

## 2015-01-17 NOTE — Clinical Social Work Note (Signed)
CSW called [facility] to let them know pasaar number was obtained and pt could discharge to SNF today  [facility] but now are stating that they cannot.  CSW called and [facility] pt [facility] bed  CSW will obtain supervision from director to assess for further SNF options  .Shannon Bubaegina Shannon Lanahan, LCSW [facility] Clinical Social Worker - Weekend Coverage cell #: [phone]

## 2015-01-17 NOTE — Discharge Summary (Addendum)
Physician Discharge Summary  Shannon Hurst DZH:[id number] DOB: Oct 11, 1940 DOA: 01/07/2015  PCP: Antonietta Jewel, MD  Admit date: 01/07/2015 Discharge date: 01/16/2015  Time spent: 25 minutes  Recommendations for Outpatient Follow-up:  1. Follow upw ith psychiatry as outpatient as needed.  2. Please follow up with PCP as needed.   Discharge Diagnoses:  Principal Problem:   Suicidal ideation Active Problems:   Bipolar disorder ([place])   HTN (hypertension)   DVT (deep venous thrombosis) (HCC)   A-fib (HCC)   UTI (lower urinary tract infection)   Chronic diastolic heart failure (HCC)   Feeling suicidal   Lower extremity weakness   UTI (urinary tract infection)   Pressure ulcer   Discharge Condition: improved  Diet recommendation: regular  Filed Weights   01/08/15 0930 01/11/15 1200  Weight: 47.3 kg (104 lb 4.4 oz) 48 kg (105 lb 13.1 oz)    History of present illness:  Shannon Hurst is a 74 y.o. female  With past history of depression and DVT on Xarelto who was hospitalized in [place] for what reportedly was a suicide attempt. Patient was felt to be stable, discharged given a train ticket back to her home in [place] and then told to go to an ER there. (While she was in [place], patient lost her apartment ) Upon arrival in the emergency room on , 10/20, patient was complaining of some overall weakness and she was found to have a moderate UTI with a mild leukocytosis. She reported active suicidal ideation and hospitalists were called for further evaluation.  MRI done noted no evidence at all of CVA. Patient's lower extremity weakness looks to be more of deconditioning & patient subjection. Seen by psychiatry who started her on Abilify who feel that patient needs active inpatient psychiatric management. Patient since responded to IV rocephin & with normal WBC now, medically stable. She's been changed over to by mouth antibiotics, completed teh course of  antibiotics. No suicidal ideations today. No complaints today.  Hospital Course:  UTI;  Presents with leukocytosis, UA with too numerous to count WBC.  Pan sensitive Proteus Changed to po Cipro-course will be completed on 10/26.   Hypotension;  Probably from dehydration and poor po intake. Resolved. . Lactic acid normal.   Hyperbilirubinemia, elevated Alk phosphatase:  Likely in the setting of acute illness from infection. Resolved.    HTN;  Well controlled.   Bioplar, depression, suicidal thought. Psych consulted.  Sitter at bedside.  Plan for inpatient hospitalization. But today she denies any suicidal ideations and no mood changes or agitation.  Sitter discontinued.    DVT; Continue with xarelto.   A-fib (HCC) paroxysmal: On Xarelto.  cardizem on hold for hypotension on admission, plan to resume it on discharge.   Lower extremity weakness:  Physical therapy eval. Will eventually need a SNF.  Stage 1 sacral decubitus ulcer-Present on admission. Encouraging out of bed with PT  Procedures:  none  Consultations:  Psychiatry.   Discharge Exam: Filed Vitals:   01/17/15 1415  BP: 97/46  Pulse: 77  Temp: 98.2 F (36.8 C)  Resp: 18    General: alert afebrile comfortable Cardiovascular: s1s2 Respiratory: ctab  Discharge Instructions   Discharge Instructions    Diet - low sodium heart healthy    Complete by:  As directed      Discharge instructions    Complete by:  As directed   Follow up with PCP in one week.          Current Discharge  Medication List    START taking these medications   Details  traZODone (DESYREL) 50 MG tablet Take 1 tablet (50 mg total) by mouth at bedtime.      CONTINUE these medications which have NOT CHANGED   Details  acetaminophen (TYLENOL) 160 MG/5ML suspension Take 480 mg by mouth 2 (two) times daily as needed. For pain.    diltiazem (CARDIZEM) 30 MG tablet Take 1 tablet (30 mg total) by mouth 2 (two) times  daily. Qty: 60 tablet, Refills: 3    Ensure Plus (ENSURE PLUS) LIQD Take 237 mLs by mouth 3 (three) times daily between meals.    QUEtiapine (SEROQUEL) 100 MG tablet Take 100 mg by mouth daily.    Rivaroxaban (XARELTO) 15 MG TABS tablet Take 1 tablet (15 mg total) by mouth daily with supper. Qty: 30 tablet, Refills: 3      STOP taking these medications     ARIPiprazole (ABILIFY) 10 MG tablet        Allergies  Allergen Reactions  . Dalmane [Flurazepam Hcl] Rash  . Penicillins Rash    Has patient had a PCN reaction causing immediate rash, facial/tongue/throat swelling, SOB or lightheadedness with hypotension: Yes Has patient had a PCN reaction causing severe rash involving mucus membranes or skin necrosis: Yes Has patient had a PCN reaction that required hospitalization Has patient had a PCN reaction occurring within the last 10 years:  If all of the above answers are "NO", then may proceed with Cephalosporin use.   . Sulfa Antibiotics Rash      The results of significant diagnostics from this hospitalization (including imaging, microbiology, ancillary and laboratory) are listed below for reference.    Significant Diagnostic Studies: Dg Chest 2 View  01/07/2015  CLINICAL DATA:  Shortness of breath.  Evaluate for pneumonia EXAM: CHEST  2 VIEW COMPARISON:  10/01/2014 FINDINGS: Large hiatal hernia with similar appearance to prior. Hyperinflation, chronic. There is no edema, consolidation, effusion, or pneumothorax. Normal heart size and stable aortic tortuosity. IMPRESSION: No acute finding. Large hiatal hernia. Electronically Signed   By: Monte Fantasia M.D.   On: 01/07/2015 11:23   Mr Brain Wo Contrast  01/08/2015  CLINICAL DATA:  CVA questioned due to confusion. Generalized weakness. EXAM: MRI HEAD WITHOUT CONTRAST TECHNIQUE: Multiplanar, multiecho pulse sequences of the brain and surrounding structures were obtained without intravenous contrast. COMPARISON:  Head CT  03/03/2010 FINDINGS: Motion degraded but overall diagnostic. Calvarium and upper cervical spine: No focal marrow signal abnormality. Orbits: No significant findings. Sinuses and Mastoids: Mucosal thickening within right concha bullosa. No acute finding. Brain: No acute infarct, hemorrhage, hydrocephalus, or mass lesion. No evidence of large vessel occlusion. No unusual/focal atrophy or ischemic change for age. No ventriculomegaly or extra-axial fluid collection. Borderline enlargement of the pituitary gland without heterogeneity to suggest mass. IMPRESSION: Normal brain MRI for age. Electronically Signed   By: Monte Fantasia M.D.   On: 01/08/2015 07:15    Microbiology: No results found for this or any previous visit (from the past 240 hour(s)).   Labs: Basic Metabolic Panel: No results for input(s): NA, K, CL, CO2, GLUCOSE, BUN, CREATININE, CALCIUM, MG, PHOS in the last 168 hours. Liver Function Tests: No results for input(s): AST, ALT, ALKPHOS, BILITOT, PROT, ALBUMIN in the last 168 hours. No results for input(s): LIPASE, AMYLASE in the last 168 hours. No results for input(s): AMMONIA in the last 168 hours. CBC: No results for input(s): WBC, NEUTROABS, HGB, HCT, MCV, PLT in the last 168  hours. Cardiac Enzymes: No results for input(s): CKTOTAL, CKMB, CKMBINDEX, TROPONINI in the last 168 hours. BNP: BNP (last 3 results)  Recent Labs  10/01/14 2100 10/02/14 0905 01/07/15 1018  BNP 46.8 52.3 36.4    ProBNP (last 3 results) No results for input(s): PROBNP in the last 8760 hours.  CBG: No results for input(s): GLUCAP in the last 168 hours.     SignedHosie Poisson  [facility] 01/17/2015, 5:07 PM

## 2015-01-17 NOTE — Clinical Social Work Note (Signed)
CSW spoke with [facility] who apologized for the miscommunications stating that the facility did not know that the admissions director was on call.  He stated that if CSW faxed the pt information he would get it to the admission directions Tamike [phone].  Marland Kitchen.Elray Bubaegina Massai Hankerson, LCSW [facility] Clinical Social Worker - Weekend Coverage cell #: [phone]

## 2015-01-17 NOTE — [place] FL2 (Signed)
[place] MEDICAID FL2 LEVEL OF CARE SCREENING TOOL     IDENTIFICATION  Patient Name: Shannon Hurst Birthdate: 1940/04/02 Sex: female Admission Date (Current Location): 01/07/2015  [facility] [place]  [place] [id number] S Facility and Address:         Provider Number: [id number]  Attending Physician Name and Address:  Kathlen ModyVijaya Akula, MD  Relative Name and Phone Number:       Current Level of Care: Hospital Recommended Level of Care: Skilled Nursing Facility Prior Approval Number:    Date Approved/Denied:   PASRR Number: waiting on level 2 determination  Discharge Plan: SNF    Current Diagnoses: Patient Active Problem List   Diagnosis Date Noted  . Pressure ulcer 01/09/2015  . UTI (urinary tract infection) 01/08/2015  . UTI (lower urinary tract infection) 01/07/2015  . Chronic diastolic heart failure (HCC) 01/07/2015  . Feeling suicidal 01/07/2015  . Lower extremity weakness 01/07/2015  . Suicidal ideation 01/07/2015  . Atrial fibrillation with rapid ventricular response (HCC) 10/02/2014  . Bipolar disorder (HCC) 10/02/2014  . HTN (hypertension) 10/02/2014  . Scab 10/02/2014  . (HFpEF) heart failure with preserved ejection fraction (HCC) 10/02/2014  . Hypertension 10/02/2014  . DVT (deep venous thrombosis) (HCC) 10/02/2014  . A-fib (HCC) 10/02/2014  . GERD (gastroesophageal reflux disease)   . Mood disorder (HCC)     Orientation ACTIVITIES/SOCIAL BLADDER RESPIRATION    Self, Time, Situation, Place    Continent Normal  BEHAVIORAL SYMPTOMS/MOOD NEUROLOGICAL BOWEL NUTRITION STATUS      Continent    PHYSICIAN VISITS COMMUNICATION OF NEEDS Height & Weight Skin    Verbally   105 lbs. Normal          AMBULATORY STATUS RESPIRATION      Normal      Personal Care Assistance Level of Assistance  Dressing, Bathing Bathing Assistance: Limited assistance   Dressing Assistance: Limited assistance      Functional Limitations Info                 SPECIAL CARE FACTORS FREQUENCY                      Additional Factors Info                  Current Medications (01/17/2015): Current Facility-Administered Medications  Medication Dose Route Frequency Provider Last Rate Last Dose  .  stroke: mapping our early stages of recovery book   Does not apply Once Hollice EspySendil K Krishnan, MD      . acetaminophen (TYLENOL) tablet 650 mg  650 mg Oral Q6H PRN Hollice EspySendil K Krishnan, MD   650 mg at 01/14/15 0555   Or  . acetaminophen (TYLENOL) suppository 650 mg  650 mg Rectal Q6H PRN Hollice EspySendil K Krishnan, MD      . carvedilol (COREG) tablet 3.125 mg  3.125 mg Oral BID WC Hollice EspySendil K Krishnan, MD   3.125 mg at 01/15/15 1814  . feeding supplement (ENSURE ENLIVE) (ENSURE ENLIVE) liquid 237 mL  237 mL Oral TID BM Hollice EspySendil K Krishnan, MD   237 mL at 01/16/15 2121  . ondansetron (ZOFRAN) tablet 4 mg  4 mg Oral Q6H PRN Hollice EspySendil K Krishnan, MD       Or  . ondansetron (ZOFRAN) injection 4 mg  4 mg Intravenous Q6H PRN Hollice EspySendil K Krishnan, MD      . polyethylene glycol (MIRALAX / GLYCOLAX) packet 17 g  17 g Oral Daily Hollice EspySendil K Krishnan, MD  17 g at 01/10/15 0951  . QUEtiapine (SEROQUEL) tablet 100 mg  100 mg Oral BID Leata Mouse, MD   100 mg at 01/16/15 2121  . Rivaroxaban (XARELTO) tablet 15 mg  15 mg Oral Q supper Hollice Espy, MD   15 mg at 01/16/15 1752  . traZODone (DESYREL) tablet 50 mg  50 mg Oral QHS Leata Mouse, MD   50 mg at 01/16/15 2121   Do not use this list as official medication orders. Please verify with discharge summary.  Discharge Medications:   Medication List    ASK your doctor about these medications        acetaminophen 160 MG/5ML suspension  Commonly known as:  TYLENOL  Take 480 mg by mouth 2 (two) times daily as needed. For pain.     ARIPiprazole 10 MG tablet  Commonly known as:  ABILIFY  Take 1 tablet (10 mg total) by mouth daily.     diltiazem 30 MG tablet  Commonly known as:  CARDIZEM   Take 1 tablet (30 mg total) by mouth 2 (two) times daily.     ENSURE PLUS Liqd  Take 237 mLs by mouth 3 (three) times daily between meals.     QUEtiapine 100 MG tablet  Commonly known as:  SEROQUEL  Take 100 mg by mouth daily.     Rivaroxaban 15 MG Tabs tablet  Commonly known as:  XARELTO  Take 1 tablet (15 mg total) by mouth daily with supper.        Relevant Imaging Results:  Relevant Lab Results:  Recent Labs    Additional Information    Annetta Maw, LCSW

## 2015-01-17 NOTE — Progress Notes (Signed)
[facility] PROGRESS NOTE  Shannon Hurst DPO:[id number] DOB: [birth year] DOA: 01/07/2015 PCP: Antonietta Jewel, MD  Assessment/Plan:  Shannon Hurst is a 74 y.o. female  With past history of depression and DVT on Xarelto who was hospitalized in [place] for what reportedly was a suicide attempt. Patient was felt to be stable, discharged given a train ticket back to her home in [place] and then told to go to an ER there. (While she was in [place], patient lost her apartment ) Upon arrival in the emergency room on , 10/20, patient was complaining of some overall weakness and she was found to have a moderate UTI with a mild leukocytosis. She reported active suicidal ideation and hospitalists were called for further evaluation.  MRI done noted no evidence at all of CVA.  Patient's lower extremity weakness looks to be more of deconditioning & patient subjection.  Seen by psychiatry who started her on Abilify who feel that patient needs active inpatient psychiatric management.  Patient since responded to IV rocephin & with normal WBC now, medically stable.  She's been changed over to by mouth antibiotics.   No complaints today.  1-UTI;  Presents with leukocytosis, UA with too numerous to count WBC.  Pan sensitive Proteus Change to po Cipro-course will be completed on 10/26.  NO NEW complaints.awaiting bed at SNF.    Hypotension;  Probably from dehydration and poor po intake. Resolved. . Lactic acid normal.   Hyperbilirubinemia, elevated Alk phosphatase:  Likely in the setting of acute illness from infection. Resolved.    HTN;  Well controlled.   Bioplar, depression, suicidal thought. Psych consulted.  Sitter at bedside.  Plan for inpatient hospitalization.   DVT; Continue with xarelto.   A-fib (HCC) paroxysmal: On Xarelto. On Coreg  cardizem on hold for hypotension on admission, plan to resume it on discharge.   Lower extremity weakness:  Physical therapy  eval.   Will eventually need a SNF.  Stage 1 sacral decubitus ulcer-Present on admission.  Encouraging out of bed with PT    Code Status: Full Code.  Family Communication:  No family Disposition Plan: Tx to inpatient psychiatry facility when bed available.   Consultants:  Psych  Procedures: ECHO: Normal Antibiotics:  Ceftriaxone 10/20-10/22.  Cipro 10/22-10/26    Objective: Filed Vitals:   01/17/15 1415  BP: 97/46  Pulse: 77  Temp: 98.2 F (36.8 C)  Resp: 18   No intake or output data in the 24 hours ending 01/17/15 1626 Filed Weights   01/08/15 0930 01/11/15 1200  Weight: 47.3 kg (104 lb 4.4 oz) 48 kg (105 lb 13.1 oz)    Exam:   General:  Alert & oriented, NAD  Cardiovascular: RRR S1S2  Respiratory: CTA  Abdomen: Soft, NT, ND hypoactive bowel sounds  Musculoskeletal: no edema  Data Reviewed: Basic Metabolic Panel: No results for input(s): NA, K, CL, CO2, GLUCOSE, BUN, CREATININE, CALCIUM, MG, PHOS in the last 168 hours. Liver Function Tests: No results for input(s): AST, ALT, ALKPHOS, BILITOT, PROT, ALBUMIN in the last 168 hours. No results for input(s): LIPASE, AMYLASE in the last 168 hours. No results for input(s): AMMONIA in the last 168 hours. CBC: No results for input(s): WBC, NEUTROABS, HGB, HCT, MCV, PLT in the last 168 hours. Cardiac Enzymes: No results for input(s): CKTOTAL, CKMB, CKMBINDEX, TROPONINI in the last 168 hours. BNP (last 3 results)  Recent Labs  10/01/14 2100 10/02/14 0905 01/07/15 1018  BNP 46.8 52.3 36.4    ProBNP (last 3 results)  No results for input(s): PROBNP in the last 8760 hours.  CBG: No results for input(s): GLUCAP in the last 168 hours.  No results found for this or any previous visit (from the past 240 hour(s)).   Studies: No results found.  Scheduled Meds: .  stroke: mapping our early stages of recovery book   Does not apply Once  . feeding supplement (ENSURE ENLIVE)  237 mL Oral TID BM  .  polyethylene glycol  17 g Oral Daily  . QUEtiapine  100 mg Oral BID  . Rivaroxaban  15 mg Oral Q supper  . traZODone  50 mg Oral QHS   Continuous Infusions:   Principal Problem:   Suicidal ideation Active Problems:   Bipolar disorder (HCC)   HTN (hypertension)   DVT (deep venous thrombosis) (HCC)   A-fib (HCC)   UTI (lower urinary tract infection)   Chronic diastolic heart failure (HCC)   Feeling suicidal   Lower extremity weakness   UTI (urinary tract infection)   Pressure ulcer    Time spent: 15 minutes.     [facility] Pager [phone] . If 7PM-7AM, please contact night-coverage at [email or web address], password [facility] 01/17/2015, 4:26 PM  LOS: 10 days

## 2015-01-17 NOTE — Progress Notes (Signed)
Report [facility] and given to [place], [place], [phone], [address]

## 2015-01-17 NOTE — [place] FL2 (Signed)
[place] MEDICAID FL2 LEVEL OF CARE SCREENING TOOL     IDENTIFICATION  Patient Name: Shannon Hurst Birthdate: 1940/04/02 Sex: female Admission Date (Current Location): 01/07/2015  [facility] [place] [facility]  [place] [id number] S Facility and Address:         Provider Number: [id number]  Attending Physician Name and Address:  Kathlen ModyVijaya Akula, MD  Relative Name and Phone Number:       Current Level of Care: Hospital Recommended Level of Care: Skilled Nursing Facility Prior Approval Number:    Date Approved/Denied:   PASRR Number: waiting on level 2 determination  Discharge Plan: SNF    Current Diagnoses: Patient Active Problem List   Diagnosis Date Noted  . Pressure ulcer 01/09/2015  . UTI (urinary tract infection) 01/08/2015  . UTI (lower urinary tract infection) 01/07/2015  . Chronic diastolic heart failure (HCC) 01/07/2015  . Feeling suicidal 01/07/2015  . Lower extremity weakness 01/07/2015  . Suicidal ideation 01/07/2015  . Atrial fibrillation with rapid ventricular response (HCC) 10/02/2014  . Bipolar disorder (HCC) 10/02/2014  . HTN (hypertension) 10/02/2014  . Scab 10/02/2014  . (HFpEF) heart failure with preserved ejection fraction (HCC) 10/02/2014  . Hypertension 10/02/2014  . DVT (deep venous thrombosis) (HCC) 10/02/2014  . A-fib (HCC) 10/02/2014  . GERD (gastroesophageal reflux disease)   . Mood disorder (HCC)     Orientation ACTIVITIES/SOCIAL BLADDER RESPIRATION    Self, Time, Situation, Place    Continent Normal  BEHAVIORAL SYMPTOMS/MOOD NEUROLOGICAL BOWEL NUTRITION STATUS      Continent    PHYSICIAN VISITS COMMUNICATION OF NEEDS Height & Weight Skin    Verbally   105 lbs. Normal          AMBULATORY STATUS RESPIRATION      Normal      Personal Care Assistance Level of Assistance  Dressing, Bathing Bathing Assistance: Limited assistance   Dressing Assistance: Limited assistance      Functional Limitations Info                 SPECIAL CARE FACTORS FREQUENCY                      Additional Factors Info                  Current Medications (01/17/2015): Current Facility-Administered Medications  Medication Dose Route Frequency Provider Last Rate Last Dose  .  stroke: mapping our early stages of recovery book   Does not apply Once Hollice EspySendil K Krishnan, MD      . acetaminophen (TYLENOL) tablet 650 mg  650 mg Oral Q6H PRN Hollice EspySendil K Krishnan, MD   650 mg at 01/14/15 0555   Or  . acetaminophen (TYLENOL) suppository 650 mg  650 mg Rectal Q6H PRN Hollice EspySendil K Krishnan, MD      . carvedilol (COREG) tablet 3.125 mg  3.125 mg Oral BID WC Hollice EspySendil K Krishnan, MD   3.125 mg at 01/15/15 1814  . feeding supplement (ENSURE ENLIVE) (ENSURE ENLIVE) liquid 237 mL  237 mL Oral TID BM Hollice EspySendil K Krishnan, MD   237 mL at 01/16/15 2121  . ondansetron (ZOFRAN) tablet 4 mg  4 mg Oral Q6H PRN Hollice EspySendil K Krishnan, MD       Or  . ondansetron (ZOFRAN) injection 4 mg  4 mg Intravenous Q6H PRN Hollice EspySendil K Krishnan, MD      . polyethylene glycol (MIRALAX / GLYCOLAX) packet 17 g  17 g Oral Daily Hollice EspySendil K Krishnan, MD  17 g at 01/10/15 0951  . QUEtiapine (SEROQUEL) tablet 100 mg  100 mg Oral BID Janardhana Jonnalagadda, MD   100 mg at 01/16/15 2121  . Rivaroxaban (XARELTO) tablet 15 mg  15 mg Oral Q supper Sendil K Krishnan, MD   15 mg at 01/16/15 1752  . traZODone (DESYREL) tablet 50 mg  50 mg Oral QHS Janardhana Jonnalagadda, MD   50 mg at 01/16/15 2121   Do not use this list as official medication orders. Please verify with discharge summary.  Discharge Medications:   Medication List    ASK your doctor about these medications        acetaminophen 160 MG/5ML suspension  Commonly known as:  TYLENOL  Take 480 mg by mouth 2 (two) times daily as needed. For pain.     ARIPiprazole 10 MG tablet  Commonly known as:  ABILIFY  Take 1 tablet (10 mg total) by mouth daily.     diltiazem 30 MG tablet  Commonly known as:  CARDIZEM   Take 1 tablet (30 mg total) by mouth 2 (two) times daily.     ENSURE PLUS Liqd  Take 237 mLs by mouth 3 (three) times daily between meals.     QUEtiapine 100 MG tablet  Commonly known as:  SEROQUEL  Take 100 mg by mouth daily.     Rivaroxaban 15 MG Tabs tablet  Commonly known as:  XARELTO  Take 1 tablet (15 mg total) by mouth daily with supper.        Relevant Imaging Results:  Relevant Lab Results:  Recent Labs    Additional Information    Adia Crammer G, LCSW     

## 2015-01-17 NOTE — Clinical Social Work Placement (Signed)
   CLINICAL SOCIAL WORK PLACEMENT  NOTE  Date:  01/17/2015  Patient Details  Name: Shannon Hurst MRN: [id number] Date of Birth: 1941-01-05  Clinical Social Work is seeking post-discharge placement for this patient at the   level of care (*CSW will initial, date and re-position this form in  chart as items are completed):      [facility]/family provided with [facility]'s list of facilities offering this level of care within the geographic area requested by the patient (or if unable, by the patient's family).      Patient/family informed of their freedom to choose among providers that offer the needed level of care, that participate in Medicare, Medicaid or managed care program needed by the patient, have an available bed and are willing to accept the patient.      Patient/family informed of [facility]'s ownership interest in [facility] and [facility], as well as of the fact that they are under no obligation to receive care at these facilities.  PASRR submitted to EDS on       PASRR number received on  January 17, 2015     Existing PASRR number confirmed on       FL2 transmitted to all facilities in geographic area requested by pt/family on       FL2 transmitted to all facilities within larger geographic area on       Patient informed that his/her managed care company has contracts with or will negotiate with certain facilities, including the following:            Patient/family informed of bed offers received.  Patient chooses bed at       Physician recommends and patient chooses bed at     [facility]   [facility] care on  .January 17, 2015  Patient to be transferred to facility by   ambulance    Patient family notified on  January 17 2015 of transfer.  Name of family member notified:    Ken/nephew    PHYSICIAN       Additional Comment:     _______________________________________________ Annetta MawKujawa,Ariyan Brisendine G, LCSW 01/17/2015, 5:43 PM

## 2015-01-17 NOTE — [place] FL2 (Signed)
[place] MEDICAID FL2 LEVEL OF CARE SCREENING TOOL     IDENTIFICATION  Patient Name: Shannon Hurst Birthdate: 10/28/1940 Sex: female Admission Date (Current Location): 01/07/2015  County and Medicaid Number:  ([place]) [id number] Facility and Address:         Provider Number: [id number]  Attending Physician Name and Address:  Vijaya Akula, MD  Relative Name and Phone Number:       Current Level of Care: Hospital Recommended Level of Care: Skilled Nursing Facility Prior Approval Number:    Date Approved/Denied:   PASRR Number: [id number]  Discharge Plan: SNF    Current Diagnoses: Patient Active Problem List   Diagnosis Date Noted  . Pressure ulcer 01/09/2015  . UTI (urinary tract infection) 01/08/2015  . UTI (lower urinary tract infection) 01/07/2015  . Chronic diastolic heart failure (HCC) 01/07/2015  . Feeling suicidal 01/07/2015  . Lower extremity weakness 01/07/2015  . Suicidal ideation 01/07/2015  . Atrial fibrillation with rapid ventricular response (HCC) 10/02/2014  . Bipolar disorder (HCC) 10/02/2014  . HTN (hypertension) 10/02/2014  . Scab 10/02/2014  . (HFpEF) heart failure with preserved ejection fraction (HCC) 10/02/2014  . Hypertension 10/02/2014  . DVT (deep venous thrombosis) (HCC) 10/02/2014  . A-fib (HCC) 10/02/2014  . GERD (gastroesophageal reflux disease)   . Mood disorder (HCC)     Orientation ACTIVITIES/SOCIAL BLADDER RESPIRATION    Self, Time, Situation, Place    Continent Normal  BEHAVIORAL SYMPTOMS/MOOD NEUROLOGICAL BOWEL NUTRITION STATUS      Continent    PHYSICIAN VISITS COMMUNICATION OF NEEDS Height & Weight Skin    Verbally   105 lbs. Normal          AMBULATORY STATUS RESPIRATION      Normal      Personal Care Assistance Level of Assistance  Dressing, Bathing Bathing Assistance: Limited assistance   Dressing Assistance: Limited assistance      Functional Limitations Info                SPECIAL  CARE FACTORS FREQUENCY                      Additional Factors Info   (full code)               Current Medications (01/17/2015): Current Facility-Administered Medications  Medication Dose Route Frequency Provider Last Rate Last Dose  .  stroke: mapping our early stages of recovery book   Does not apply Once Sendil K Krishnan, MD      . acetaminophen (TYLENOL) tablet 650 mg  650 mg Oral Q6H PRN Sendil K Krishnan, MD   650 mg at 01/14/15 0555   Or  . acetaminophen (TYLENOL) suppository 650 mg  650 mg Rectal Q6H PRN Sendil K Krishnan, MD      . carvedilol (COREG) tablet 3.125 mg  3.125 mg Oral BID WC Sendil K Krishnan, MD   3.125 mg at 01/15/15 1814  . feeding supplement (ENSURE ENLIVE) (ENSURE ENLIVE) liquid 237 mL  237 mL Oral TID BM Sendil K Krishnan, MD   237 mL at 01/16/15 2121  . ondansetron (ZOFRAN) tablet 4 mg  4 mg Oral Q6H PRN Sendil K Krishnan, MD       Or  . ondansetron (ZOFRAN) injection 4 mg  4 mg Intravenous Q6H PRN Sendil K Krishnan, MD      . polyethylene glycol (MIRALAX / GLYCOLAX) packet 17 g  17 g Oral Daily Sendil K Krishnan, MD   17 g   at 01/10/15 0951  . QUEtiapine (SEROQUEL) tablet 100 mg  100 mg Oral BID Leata MouseJanardhana Jonnalagadda, MD   100 mg at 01/16/15 2121  . Rivaroxaban (XARELTO) tablet 15 mg  15 mg Oral Q supper Hollice EspySendil K Krishnan, MD   15 mg at 01/16/15 1752  . traZODone (DESYREL) tablet 50 mg  50 mg Oral QHS Leata MouseJanardhana Jonnalagadda, MD   50 mg at 01/16/15 2121   Do not use this list as official medication orders. Please verify with discharge summary.  Discharge Medications:   Medication List    ASK your doctor about these medications        acetaminophen 160 MG/5ML suspension  Commonly known as:  TYLENOL  Take 480 mg by mouth 2 (two) times daily as needed. For pain.     ARIPiprazole 10 MG tablet  Commonly known as:  ABILIFY  Take 1 tablet (10 mg total) by mouth daily.     diltiazem 30 MG tablet  Commonly known as:  CARDIZEM  Take 1 tablet  (30 mg total) by mouth 2 (two) times daily.     ENSURE PLUS Liqd  Take 237 mLs by mouth 3 (three) times daily between meals.     QUEtiapine 100 MG tablet  Commonly known as:  SEROQUEL  Take 100 mg by mouth daily.     Rivaroxaban 15 MG Tabs tablet  Commonly known as:  XARELTO  Take 1 tablet (15 mg total) by mouth daily with supper.        Relevant Imaging Results:  Relevant Lab Results:  Recent Labs    Additional Information    Annetta MawKujawa,Letina Luckett G, LCSW

## 2015-01-17 NOTE — [place] FL2 (Signed)
[place] MEDICAID [id number] LEVEL OF CARE SCREENING TOOL     IDENTIFICATION  Patient Name: Shannon Hurst Birthdate: 03/31/1940 Sex: female Admission Date (Current Location): 01/07/2015  [facility] and [place] Number:  ([place]) [id number] S Facility and Address:         Provider Number: [id number]  Attending Physician Name and Address:  Kathlen Mody, MD  Relative Name and Phone Number:       Current Level of Care: Hospital Recommended Level of Care: Skilled Nursing Facility Prior Approval Number:    Date Approved/Denied:   PASRR Number: [id number]  Discharge Plan: SNF    Current Diagnoses: Patient Active Problem List   Diagnosis Date Noted  . Pressure ulcer 01/09/2015  . UTI (urinary tract infection) 01/08/2015  . UTI (lower urinary tract infection) 01/07/2015  . Chronic diastolic heart failure (HCC) 01/07/2015  . Feeling suicidal 01/07/2015  . Lower extremity weakness 01/07/2015  . Suicidal ideation 01/07/2015  . Atrial fibrillation with rapid ventricular response (HCC) 10/02/2014  . Bipolar disorder (HCC) 10/02/2014  . HTN (hypertension) 10/02/2014  . Scab 10/02/2014  . (HFpEF) heart failure with preserved ejection fraction (HCC) 10/02/2014  . Hypertension 10/02/2014  . DVT (deep venous thrombosis) (HCC) 10/02/2014  . A-fib (HCC) 10/02/2014  . GERD (gastroesophageal reflux disease)   . Mood disorder (HCC)     Orientation ACTIVITIES/SOCIAL BLADDER RESPIRATION    Self, Time, Situation, Place    Continent Normal  BEHAVIORAL SYMPTOMS/MOOD NEUROLOGICAL BOWEL NUTRITION STATUS      Continent    PHYSICIAN VISITS COMMUNICATION OF NEEDS Height & Weight Skin    Verbally   105 lbs. Normal          AMBULATORY STATUS RESPIRATION      Normal      Personal Care Assistance Level of Assistance  Dressing, Bathing Bathing Assistance: Limited assistance   Dressing Assistance: Limited assistance      Functional Limitations Info                SPECIAL  CARE FACTORS FREQUENCY                      Additional Factors Info   (full code)               Current Medications (01/17/2015): Current Facility-Administered Medications  Medication Dose Route Frequency Provider Last Rate Last Dose  .  stroke: mapping our early stages of recovery book   Does not apply Once Hollice Espy, MD      . acetaminophen (TYLENOL) tablet 650 mg  650 mg Oral Q6H PRN Hollice Espy, MD   650 mg at 01/14/15 0555   Or  . acetaminophen (TYLENOL) suppository 650 mg  650 mg Rectal Q6H PRN Hollice Espy, MD      . carvedilol (COREG) tablet 3.125 mg  3.125 mg Oral BID WC Hollice Espy, MD   3.125 mg at 01/15/15 1814  . feeding supplement (ENSURE ENLIVE) (ENSURE ENLIVE) liquid 237 mL  237 mL Oral TID BM Hollice Espy, MD   237 mL at 01/16/15 2121  . ondansetron (ZOFRAN) tablet 4 mg  4 mg Oral Q6H PRN Hollice Espy, MD       Or  . ondansetron (ZOFRAN) injection 4 mg  4 mg Intravenous Q6H PRN Hollice Espy, MD      . polyethylene glycol (MIRALAX / GLYCOLAX) packet 17 g  17 g Oral Daily Hollice Espy, MD   17 g  at 01/10/15 0951  . QUEtiapine (SEROQUEL) tablet 100 mg  100 mg Oral BID Leata MouseJanardhana Jonnalagadda, MD   100 mg at 01/16/15 2121  . Rivaroxaban (XARELTO) tablet 15 mg  15 mg Oral Q supper Hollice EspySendil K Krishnan, MD   15 mg at 01/16/15 1752  . traZODone (DESYREL) tablet 50 mg  50 mg Oral QHS Leata MouseJanardhana Jonnalagadda, MD   50 mg at 01/16/15 2121   Do not use this list as official medication orders. Please verify with discharge summary.  Discharge Medications:   Medication List    ASK your doctor about these medications        acetaminophen 160 MG/5ML suspension  Commonly known as:  TYLENOL  Take 480 mg by mouth 2 (two) times daily as needed. For pain.     ARIPiprazole 10 MG tablet  Commonly known as:  ABILIFY  Take 1 tablet (10 mg total) by mouth daily.     diltiazem 30 MG tablet  Commonly known as:  CARDIZEM  Take 1 tablet  (30 mg total) by mouth 2 (two) times daily.     ENSURE PLUS Liqd  Take 237 mLs by mouth 3 (three) times daily between meals.     QUEtiapine 100 MG tablet  Commonly known as:  SEROQUEL  Take 100 mg by mouth daily.     Rivaroxaban 15 MG Tabs tablet  Commonly known as:  XARELTO  Take 1 tablet (15 mg total) by mouth daily with supper.        Relevant Imaging Results:  Relevant Lab Results:  Recent Labs    Additional Information    Annetta MawKujawa,Espiridion Supinski G, LCSW

## 2017-05-03 IMAGING — CR DG CHEST 2V
2 series · 2 of 2 positions shown · non-contrast
Comparison: July 02, 2014

CLINICAL DATA: One day history of chest pain

EXAM:
CHEST  2 VIEW

[chest lat]
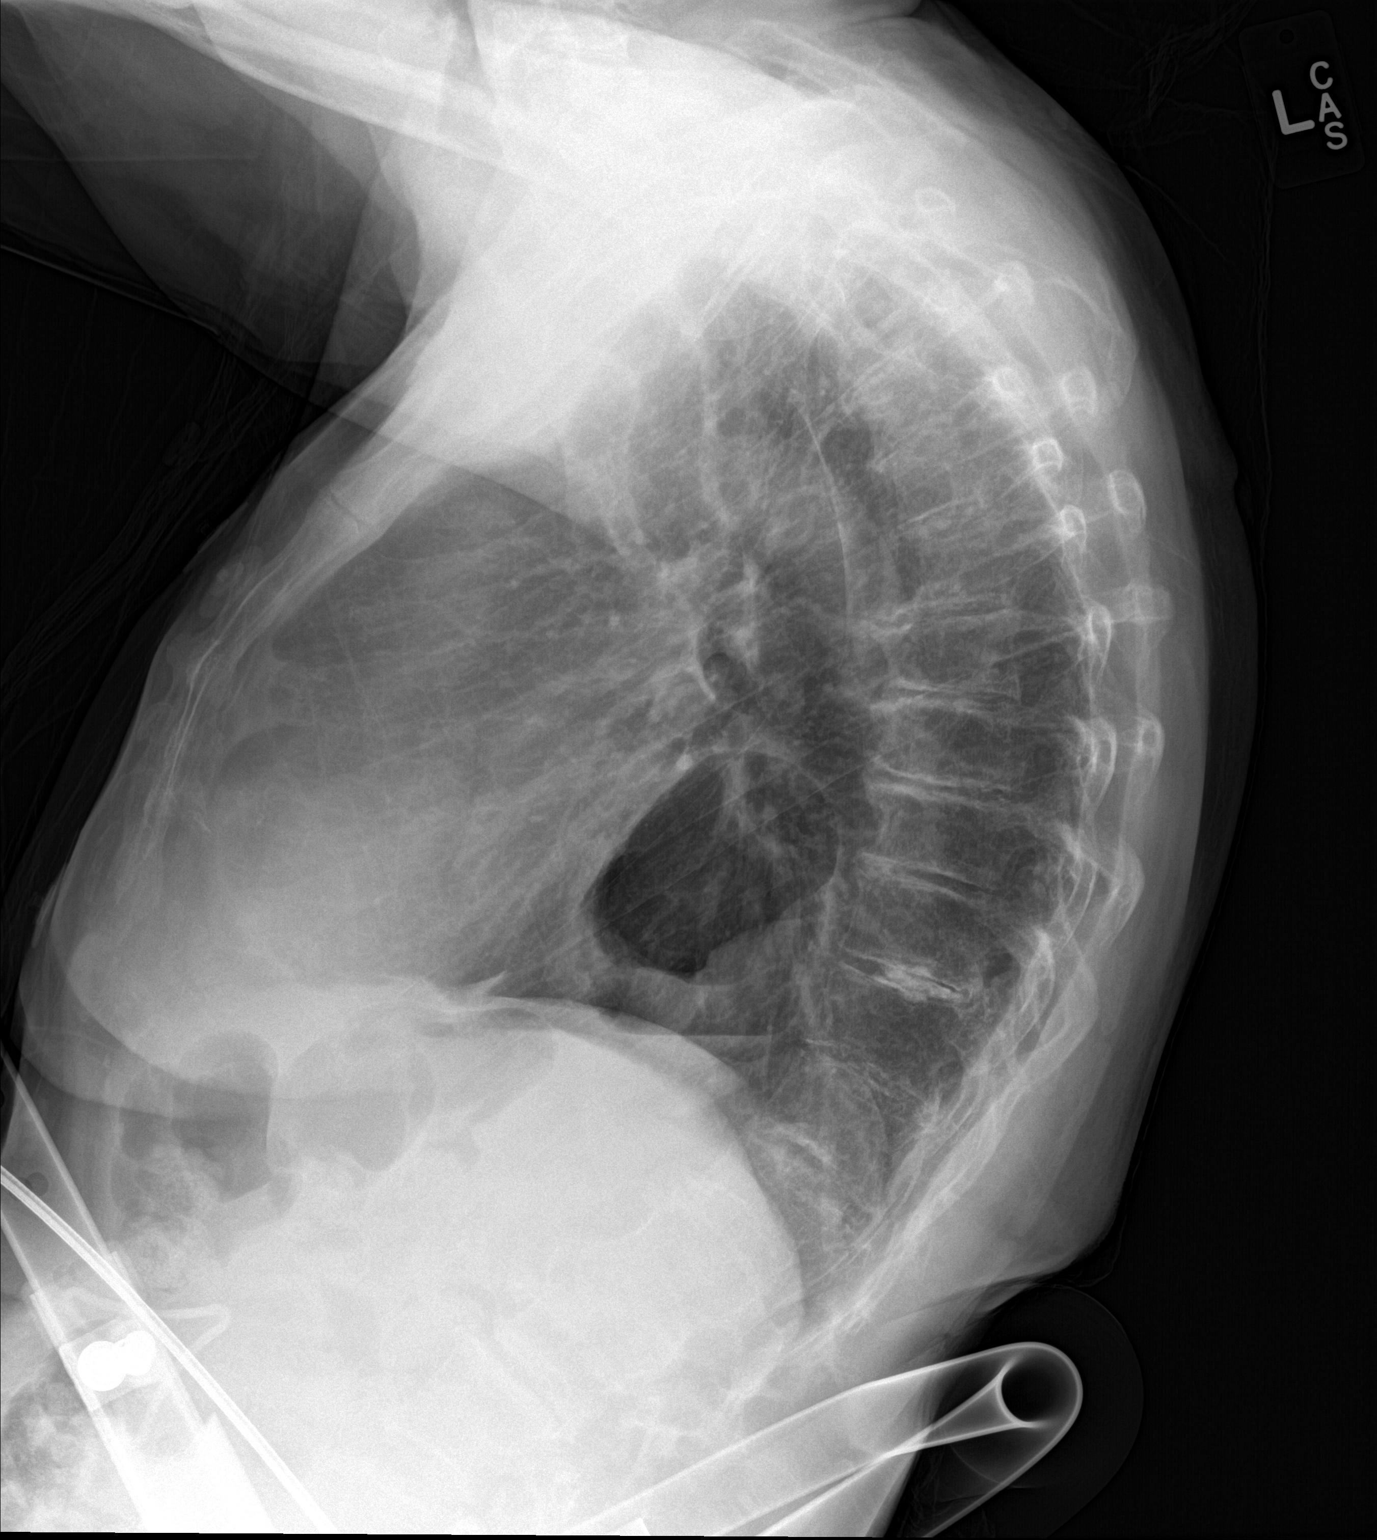

[chest ap]
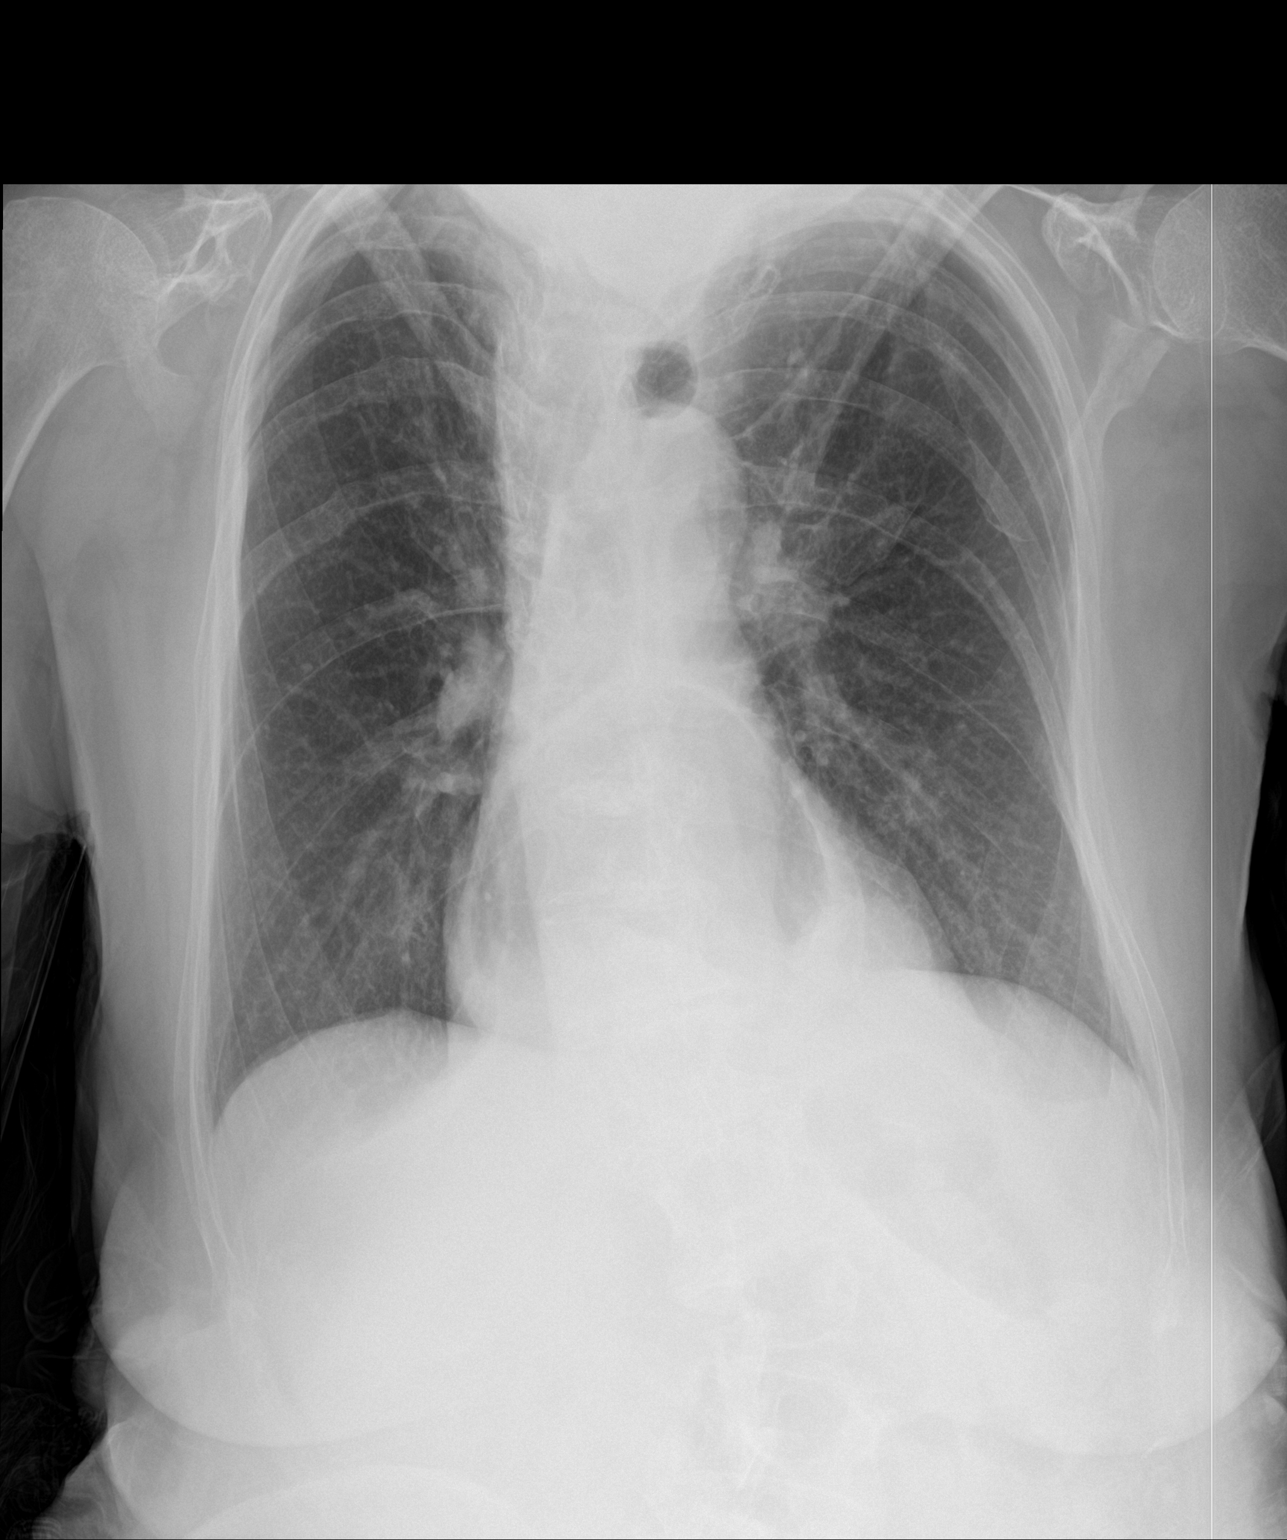

[2 of 2 positions shown; findings below may reference images not displayed]

FINDINGS: There is no edema or consolidation. The heart size and pulmonary
vascularity are normal. No adenopathy. There is a sizable hiatal
type hernia with much of the stomach above the diaphragm. There is
degenerative change in the thoracic spine.
IMPRESSION: Sizable hiatal type hernia.  No lung edema or consolidation.

## 2018-07-19 DEATH — deceased
